# Patient Record
Sex: Female | Born: 1975 | Race: Black or African American | Hispanic: No | Marital: Married | Smoking: Former smoker
Health system: Southern US, Community
[De-identification: ages and names within clinical notes are randomized; demographics above are authoritative.]

## PROBLEM LIST (undated history)

## (undated) DIAGNOSIS — E0500 Thyrotoxicosis with diffuse goiter without thyrotoxic crisis or storm: Secondary | ICD-10-CM

## (undated) DIAGNOSIS — F419 Anxiety disorder, unspecified: Secondary | ICD-10-CM

## (undated) DIAGNOSIS — K219 Gastro-esophageal reflux disease without esophagitis: Secondary | ICD-10-CM

## (undated) DIAGNOSIS — R002 Palpitations: Secondary | ICD-10-CM

## (undated) DIAGNOSIS — E669 Obesity, unspecified: Secondary | ICD-10-CM

## (undated) DIAGNOSIS — G5600 Carpal tunnel syndrome, unspecified upper limb: Secondary | ICD-10-CM

## (undated) HISTORY — DX: Anxiety disorder, unspecified: F41.9

## (undated) HISTORY — DX: Carpal tunnel syndrome, unspecified upper limb: G56.00

## (undated) HISTORY — DX: Obesity, unspecified: E66.9

## (undated) HISTORY — PX: WISDOM TOOTH EXTRACTION: SHX21

## (undated) HISTORY — PX: IUD REMOVAL: SHX5392

## (undated) HISTORY — DX: Thyrotoxicosis with diffuse goiter without thyrotoxic crisis or storm: E05.00

## (undated) HISTORY — PX: OTHER SURGICAL HISTORY: SHX169

---

## 2003-05-08 ENCOUNTER — Emergency Department (HOSPITAL_COMMUNITY): Admission: EM | Admit: 2003-05-08 | Discharge: 2003-05-08 | Payer: Self-pay | Admitting: Emergency Medicine

## 2004-09-23 ENCOUNTER — Other Ambulatory Visit: Payer: Self-pay

## 2004-09-23 ENCOUNTER — Emergency Department: Payer: Self-pay | Admitting: Emergency Medicine

## 2005-01-13 ENCOUNTER — Emergency Department: Payer: Self-pay | Admitting: Emergency Medicine

## 2007-04-06 ENCOUNTER — Emergency Department (HOSPITAL_COMMUNITY): Admission: EM | Admit: 2007-04-06 | Discharge: 2007-04-06 | Payer: Self-pay | Admitting: Emergency Medicine

## 2008-06-15 ENCOUNTER — Emergency Department (HOSPITAL_COMMUNITY): Admission: EM | Admit: 2008-06-15 | Discharge: 2008-06-15 | Payer: Self-pay | Admitting: Emergency Medicine

## 2008-11-16 ENCOUNTER — Encounter: Payer: Self-pay | Admitting: Emergency Medicine

## 2008-11-16 ENCOUNTER — Inpatient Hospital Stay (HOSPITAL_COMMUNITY): Admission: AD | Admit: 2008-11-16 | Discharge: 2008-11-16 | Payer: Self-pay | Admitting: Obstetrics and Gynecology

## 2008-11-17 ENCOUNTER — Inpatient Hospital Stay (HOSPITAL_COMMUNITY): Admission: AD | Admit: 2008-11-17 | Discharge: 2008-11-17 | Payer: Self-pay | Admitting: Obstetrics and Gynecology

## 2009-01-28 HISTORY — PX: TUBAL LIGATION: SHX77

## 2009-05-02 ENCOUNTER — Emergency Department (HOSPITAL_COMMUNITY): Admission: EM | Admit: 2009-05-02 | Discharge: 2009-05-02 | Payer: Self-pay | Admitting: Family Medicine

## 2009-07-10 ENCOUNTER — Emergency Department (HOSPITAL_COMMUNITY): Admission: EM | Admit: 2009-07-10 | Discharge: 2009-07-10 | Payer: Self-pay | Admitting: Emergency Medicine

## 2009-07-13 ENCOUNTER — Emergency Department (HOSPITAL_COMMUNITY): Admission: EM | Admit: 2009-07-13 | Discharge: 2009-07-13 | Payer: Self-pay | Admitting: Family Medicine

## 2009-07-15 ENCOUNTER — Emergency Department (HOSPITAL_COMMUNITY): Admission: EM | Admit: 2009-07-15 | Discharge: 2009-07-15 | Payer: Self-pay | Admitting: Family Medicine

## 2009-08-08 ENCOUNTER — Emergency Department (HOSPITAL_COMMUNITY): Admission: EM | Admit: 2009-08-08 | Discharge: 2009-08-08 | Payer: Self-pay | Admitting: Family Medicine

## 2009-08-16 ENCOUNTER — Emergency Department (HOSPITAL_COMMUNITY): Admission: EM | Admit: 2009-08-16 | Discharge: 2009-08-16 | Payer: Self-pay | Admitting: Emergency Medicine

## 2009-09-20 ENCOUNTER — Emergency Department (HOSPITAL_COMMUNITY): Admission: EM | Admit: 2009-09-20 | Discharge: 2009-09-20 | Payer: Self-pay | Admitting: Family Medicine

## 2009-11-25 ENCOUNTER — Encounter: Payer: Self-pay | Admitting: Emergency Medicine

## 2009-11-26 ENCOUNTER — Encounter (INDEPENDENT_AMBULATORY_CARE_PROVIDER_SITE_OTHER): Payer: Self-pay | Admitting: Obstetrics and Gynecology

## 2009-11-26 ENCOUNTER — Observation Stay (HOSPITAL_COMMUNITY): Admission: AD | Admit: 2009-11-26 | Discharge: 2009-11-26 | Payer: Self-pay | Admitting: Obstetrics & Gynecology

## 2010-04-11 LAB — URINALYSIS, ROUTINE W REFLEX MICROSCOPIC
Glucose, UA: NEGATIVE mg/dL
Ketones, ur: 15 mg/dL — AB
Nitrite: NEGATIVE
Protein, ur: NEGATIVE mg/dL
pH: 6.5 (ref 5.0–8.0)

## 2010-04-11 LAB — DIFFERENTIAL
Lymphocytes Relative: 33 % (ref 12–46)
Lymphs Abs: 3.3 10*3/uL (ref 0.7–4.0)
Monocytes Relative: 10 % (ref 3–12)
Neutro Abs: 5.6 10*3/uL (ref 1.7–7.7)
Neutrophils Relative %: 57 % (ref 43–77)

## 2010-04-11 LAB — CBC
HCT: 29.5 % — ABNORMAL LOW (ref 36.0–46.0)
Hemoglobin: 10.8 g/dL — ABNORMAL LOW (ref 12.0–15.0)
MCHC: 34.2 g/dL (ref 30.0–36.0)
Platelets: 264 10*3/uL (ref 150–400)
RBC: 3.48 MIL/uL — ABNORMAL LOW (ref 3.87–5.11)
RDW: 14.2 % (ref 11.5–15.5)
WBC: 10.7 10*3/uL — ABNORMAL HIGH (ref 4.0–10.5)
WBC: 9.9 10*3/uL (ref 4.0–10.5)

## 2010-04-11 LAB — TYPE AND SCREEN: ABO/RH(D): AB POS

## 2010-04-11 LAB — BASIC METABOLIC PANEL
CO2: 22 mEq/L (ref 19–32)
Calcium: 8.4 mg/dL (ref 8.4–10.5)
Creatinine, Ser: 0.71 mg/dL (ref 0.4–1.2)
GFR calc Af Amer: >60 mL/min (ref >60)
Sodium: 136 mEq/L (ref 135–145)

## 2010-04-11 LAB — HCG, QUANTITATIVE, PREGNANCY: hCG, Beta Chain, Quant, S: 2915 m[IU]/mL — ABNORMAL HIGH (ref <5)

## 2010-04-11 LAB — POCT PREGNANCY, URINE: Preg Test, Ur: POSITIVE

## 2010-04-13 ENCOUNTER — Inpatient Hospital Stay (HOSPITAL_COMMUNITY)
Admission: AD | Admit: 2010-04-13 | Discharge: 2010-04-13 | Disposition: A | Discharge status: Home / Self Care | Payer: Self-pay | Source: Ambulatory Visit | Attending: Obstetrics & Gynecology | Admitting: Obstetrics & Gynecology

## 2010-04-13 DIAGNOSIS — L03317 Cellulitis of buttock: Secondary | ICD-10-CM

## 2010-04-13 DIAGNOSIS — L0231 Cutaneous abscess of buttock: Secondary | ICD-10-CM | POA: Insufficient documentation

## 2010-04-15 LAB — CULTURE, ROUTINE-ABSCESS

## 2010-04-16 LAB — CULTURE, ROUTINE-ABSCESS

## 2010-04-18 LAB — POCT URINALYSIS DIP (DEVICE)
Glucose, UA: NEGATIVE mg/dL
Nitrite: NEGATIVE
pH: 6 (ref 5.0–8.0)

## 2010-05-03 LAB — CBC
HCT: 38.2 % (ref 36.0–46.0)
HCT: 39.3 % (ref 36.0–46.0)
Hemoglobin: 12 g/dL (ref 12.0–15.0)
Hemoglobin: 12.2 g/dL (ref 12.0–15.0)
Hemoglobin: 12.9 g/dL (ref 12.0–15.0)
Hemoglobin: 13 g/dL (ref 12.0–15.0)
MCHC: 33.2 g/dL (ref 30.0–36.0)
MCV: 90.7 fL (ref 78.0–100.0)
MCV: 91.6 fL (ref 78.0–100.0)
Platelets: 253 10*3/uL (ref 150–400)
RBC: 3.98 MIL/uL (ref 3.87–5.11)
RBC: 4.02 MIL/uL (ref 3.87–5.11)
RBC: 4.2 MIL/uL (ref 3.87–5.11)
RDW: 13.9 % (ref 11.5–15.5)
WBC: 6.2 10*3/uL (ref 4.0–10.5)
WBC: 7.3 10*3/uL (ref 4.0–10.5)
WBC: 8.4 10*3/uL (ref 4.0–10.5)

## 2010-05-03 LAB — TYPE AND SCREEN

## 2010-05-03 LAB — ABO/RH: ABO/RH(D): AB POS

## 2010-05-03 LAB — DIFFERENTIAL
Eosinophils Relative: 0 % (ref 0–5)
Lymphocytes Relative: 28 % (ref 12–46)
Lymphs Abs: 2 10*3/uL (ref 0.7–4.0)
Monocytes Absolute: 0.5 10*3/uL (ref 0.1–1.0)
Monocytes Relative: 7 % (ref 3–12)

## 2010-05-03 LAB — URINALYSIS, ROUTINE W REFLEX MICROSCOPIC
Hgb urine dipstick: NEGATIVE
Protein, ur: NEGATIVE mg/dL
Urobilinogen, UA: 1 mg/dL (ref 0.0–1.0)

## 2010-05-03 LAB — URINE MICROSCOPIC-ADD ON

## 2010-05-03 LAB — POCT PREGNANCY, URINE: Preg Test, Ur: POSITIVE

## 2013-04-12 ENCOUNTER — Encounter: Payer: Self-pay | Admitting: Family Medicine

## 2014-03-01 ENCOUNTER — Ambulatory Visit
Admission: RE | Admit: 2014-03-01 | Discharge: 2014-03-01 | Disposition: A | Discharge status: Home / Self Care | Payer: Medicaid Other | Source: Ambulatory Visit | Attending: Cardiovascular Disease | Admitting: Cardiovascular Disease

## 2014-03-01 ENCOUNTER — Other Ambulatory Visit: Payer: Self-pay | Admitting: Cardiovascular Disease

## 2014-03-01 DIAGNOSIS — R200 Anesthesia of skin: Secondary | ICD-10-CM

## 2014-03-01 DIAGNOSIS — R202 Paresthesia of skin: Principal | ICD-10-CM

## 2014-05-06 ENCOUNTER — Encounter: Payer: Self-pay | Admitting: Neurology

## 2014-05-06 ENCOUNTER — Ambulatory Visit (INDEPENDENT_AMBULATORY_CARE_PROVIDER_SITE_OTHER): Payer: Medicaid Other | Admitting: Neurology

## 2014-05-06 ENCOUNTER — Ambulatory Visit (INDEPENDENT_AMBULATORY_CARE_PROVIDER_SITE_OTHER): Payer: Self-pay | Admitting: Neurology

## 2014-05-06 DIAGNOSIS — G5602 Carpal tunnel syndrome, left upper limb: Secondary | ICD-10-CM

## 2014-05-06 DIAGNOSIS — G5601 Carpal tunnel syndrome, right upper limb: Secondary | ICD-10-CM

## 2014-05-06 DIAGNOSIS — G5603 Carpal tunnel syndrome, bilateral upper limbs: Secondary | ICD-10-CM

## 2014-05-06 DIAGNOSIS — G5600 Carpal tunnel syndrome, unspecified upper limb: Secondary | ICD-10-CM | POA: Insufficient documentation

## 2014-05-06 HISTORY — DX: Carpal tunnel syndrome, unspecified upper limb: G56.00

## 2014-05-06 NOTE — Progress Notes (Signed)
Please refer to EMG and nerve conduction study procedure note. 

## 2014-05-06 NOTE — Procedures (Signed)
     HISTORY:  Samantha Willis is a 39 year old patient with a history of Graves' disease with a six-month history of bilateral hand discomfort and numbness. The patient reports some left shoulder discomfort as well. She is being evaluated for a possible neuropathy or a cervical radiculopathy.  NERVE CONDUCTION STUDIES:  Nerve conduction studies were performed on both upper extremities. The distal motor latencies for the median nerves were prolonged bilaterally, with normal motor amplitudes for these nerves bilaterally. The distal motor latencies and motor amplitudes for the ulnar nerves were normal bilaterally. The F wave latencies and nerve conduction velocities for the median and ulnar nerves were normal bilaterally. The sensory latencies for the median nerves were prolonged bilaterally, normal for the ulnar nerves bilaterally.  EMG STUDIES:  EMG study was performed on the left upper extremity:  The first dorsal interosseous muscle reveals 2 to 4 K units with full recruitment. No fibrillations or positive waves were noted. The abductor pollicis brevis muscle reveals 2 to 4 K units with full recruitment. No fibrillations or positive waves were noted. The extensor indicis proprius muscle reveals 1 to 3 K units with full recruitment. No fibrillations or positive waves were noted. The pronator teres muscle reveals 2 to 3 K units with full recruitment. No fibrillations or positive waves were noted. The biceps muscle reveals 1 to 2 K units with full recruitment. No fibrillations or positive waves were noted. The triceps muscle reveals 2 to 4 K units with full recruitment. No fibrillations or positive waves were noted. The anterior deltoid muscle reveals 2 to 3 K units with full recruitment. No fibrillations or positive waves were noted. The cervical paraspinal muscles were tested at 2 levels. No abnormalities of insertional activity were seen at either level tested. There was good  relaxation.  A limited EMG study was performed on the right upper extremity:  The first dorsal interosseous muscle reveals 2 to 4 K units with full recruitment. No fibrillations or positive waves were noted. The abductor pollicis brevis muscle reveals 2 to 4 K units with full recruitment. No fibrillations or positive waves were noted. The extensor indicis proprius muscle reveals 1 to 3 K units with full recruitment. No fibrillations or positive waves were noted.   IMPRESSION:  Nerve conduction studies done on both upper extremities shows evidence of bilateral mild carpal tunnel syndrome. EMG evaluation of the left upper extremity was unremarkable, without evidence of an overlying cervical radiculopathy. A limited EMG study of the right upper extremity was unremarkable.  Jill Alexanders MD 05/06/2014 10:51 AM  [facility] [address] [place], [place] [address]  Phone [phone] Fax [phone]

## 2014-05-10 ENCOUNTER — Encounter: Payer: Self-pay | Admitting: Neurology

## 2014-05-10 ENCOUNTER — Ambulatory Visit (INDEPENDENT_AMBULATORY_CARE_PROVIDER_SITE_OTHER): Payer: Medicaid Other | Admitting: Neurology

## 2014-05-10 VITALS — BP 111/69 | HR 78 | Ht 63.0 in | Wt 177.6 lb

## 2014-05-10 DIAGNOSIS — G5601 Carpal tunnel syndrome, right upper limb: Secondary | ICD-10-CM

## 2014-05-10 DIAGNOSIS — G5602 Carpal tunnel syndrome, left upper limb: Secondary | ICD-10-CM

## 2014-05-10 DIAGNOSIS — G5603 Carpal tunnel syndrome, bilateral upper limbs: Secondary | ICD-10-CM

## 2014-05-10 NOTE — Progress Notes (Signed)
Reason for visit: Carpal tunnel syndrome  Referring physician: Dr. Kadakia  Samantha Willis is a 39 y.o. female  History of present illness:  Samantha Willis is a 39 year old black female with a history of numbness involving the hands that dates back about 6 months. The patient has a history of Graves' disease, and she is currently being treated for this. The patient indicates that the numbness may wake her up at night, she does have some slight neck discomfort, but no pain going down the arms from the neck. The patient has some intermittent numbness of the feet at times, but she relates this to swelling in the feet. She denies back pain, difficulty with weakness of the extremities, or difficulty with balance. She denies any issues controlling the bowels or the bladder. She has not had any falls. She has undergone EMG and nerve conduction study evaluation that shows evidence of mild bilateral carpal tunnel syndrome. She returns for an evaluation.  Past Medical History  Diagnosis Date  . Carpal tunnel syndrome 05/06/2014     Bilateral  . Obesity   . Graves disease     Past Surgical History  Procedure Laterality Date  . Cesarean section    . Tubal ligation  2011  . Wisdom tooth extraction      Family History  Problem Relation Age of Onset  . Heart attack Mother   . Cancer Mother 14    breast  . Diabetes Mother     II  . Hypertension Father   . Hyperlipidemia Father   . Gout Father   . Healthy Sister   . Healthy Brother   . Healthy Brother     Social history:  reports that she has been smoking.  She has never used smokeless tobacco. She reports that she drinks alcohol. She reports that she does not use illicit drugs.  Medications:  Prior to Admission medications   Medication Sig Start Date End Date Taking? Authorizing Provider  ALPRAZolam Duanne Moron) 0.25 MG tablet Take 0.25 mg by mouth at bedtime as needed for anxiety.   Yes Historical Provider, MD  Cholecalciferol (VITAMIN  D3) 2000 UNITS TABS Take 1 tablet by mouth daily.   Yes Historical Provider, MD  ibuprofen (ADVIL,MOTRIN) 200 MG tablet Take 200 mg by mouth every 8 (eight) hours as needed.   Yes Historical Provider, MD  methimazole (TAPAZOLE) 5 MG tablet Take 5 mg by mouth daily.   Yes Historical Provider, MD  metoprolol succinate (TOPROL-XL) 25 MG 24 hr tablet Take 25 mg by mouth daily.   Yes Historical Provider, MD  omeprazole (PRILOSEC) 20 MG capsule Take 20 mg by mouth daily.   Yes Historical Provider, MD     No Known Allergies  ROS:  Out of a complete 14 system review of symptoms, the patient complains only of the following symptoms, and all other reviewed systems are negative.  Palpitations of the heart, swelling in the legs Feeling hot, cold Allergies, runny nose Diarrhea Numbness Depression, anxiety Insomnia, sleepiness  Blood pressure 111/69, pulse 78, height 5\' 3"  (1.6 m), weight 177 lb 9.6 oz (80.559 kg).  Physical Exam  General: The patient is alert and cooperative at the time of the examination.  Eyes: Pupils are equal, round, and reactive to light. Discs are flat bilaterally.  Neck: The neck is supple, no carotid bruits are noted.  Respiratory: The respiratory examination is clear.  Cardiovascular: The cardiovascular examination reveals a regular rate and rhythm, no obvious murmurs or  rubs are noted.  Skin: Extremities are without significant edema.  Neurologic Exam  Mental status: The patient is alert and oriented x 3 at the time of the examination. The patient has apparent normal recent and remote memory, with an apparently normal attention span and concentration ability.  Cranial nerves: Facial symmetry is present. There is good sensation of the face to pinprick and soft touch bilaterally. The strength of the facial muscles and the muscles to head turning and shoulder shrug are normal bilaterally. Speech is well enunciated, no aphasia or dysarthria is noted. Extraocular  movements are full. Visual fields are full. The tongue is midline, and the patient has symmetric elevation of the soft palate. No obvious hearing deficits are noted.  Motor: The motor testing reveals 5 over 5 strength of all 4 extremities. Good symmetric motor tone is noted throughout.  Sensory: Sensory testing is intact to pinprick, soft touch, vibration sensation, and position sense on all 4 extremities. No evidence of extinction is noted.  Coordination: Cerebellar testing reveals good finger-nose-finger and heel-to-shin bilaterally.  Gait and station: Gait is normal. Tandem gait is normal. Romberg is negative. No drift is seen.  Reflexes: Deep tendon reflexes are symmetric and normal bilaterally. Toes are downgoing bilaterally.   Assessment/Plan:  1. Bilateral carpal tunnel syndrome  The patient has undergone nerve conduction studies that show evidence of mild bilateral carpal, syndrome. She continued to be symptomatic. The patient was given a prescription for bilateral wrist splints, she will wear the splints for least 6-8 weeks, but if she has not gained benefit after this period time, she is to contact our office, we will get her set up for a referral to a hand surgeon. Otherwise, she will follow-up through this office if needed.  Jill Alexanders MD 05/10/2014 7:53 PM  [facility] [address] [place], [place] [address]  Phone [phone] Fax [phone]

## 2014-05-10 NOTE — Patient Instructions (Signed)
Carpal Tunnel Syndrome °Carpal tunnel syndrome is a disorder of the nervous system in the wrist that causes pain, hand weakness, and/or loss of feeling. Carpal tunnel syndrome is caused by the compression, stretching, or irritation of the median nerve at the wrist joint. Athletes who experience carpal tunnel syndrome may notice a decrease in their performance to the condition, especially for sports that require strong hand or wrist action.  °SYMPTOMS  °· Tingling, numbness, or burning pain in the hand or fingers. °· Inability to sleep due to pain in the hand. °· Sharp pains that shoot from the wrist up the arm or to the fingers, especially at night. °· Morning stiffness or cramping of the hand. °· Thumb weakness, resulting in difficulty holding objects or making a fist. °· Shiny, dry skin on the hand. °· Reduced performance in any sport requiring a strong grip. °CAUSES  °· Median nerve damage at the wrist is caused by pressure due to swelling, inflammation, or scarred tissue. °· Sources of pressure include: °¨ Repetitive gripping or squeezing that causes inflammation of the tendon sheaths. °¨ Scarring or shortening of the ligament that covers the median nerve. °¨ Traumatic injury to the wrist or forearm such as fracture, sprain, or dislocation. °¨ Prolonged hyperextension (wrist bent backward) or hyperflexion (wrist bent downward) of the wrist. °RISK INCREASES WITH: °· Diabetes mellitus. °· Menopause or amenorrhea. °· Rheumatoid arthritis. °· Raynaud disease. °· Pregnancy. °· Gout. °· Kidney disease. °· Ganglion cyst. °· Repetitive hand or wrist action. °· Hypothyroidism (underactive thyroid gland). °· Repetitive jolting or shaking of the hands or wrist. °· Prolonged forceful weight-bearing on the hands. °PREVENTION °· Bracing the hand and wrist straight during activities that involve repetitive grasping. °· For activities that require prolonged extension of the wrist (bending towards the top of the forearm)  periodically change the position of your wrists. °· Learn and use proper technique in activities that result in the wrist position in neutral to slight extension. °· Avoid bending the wrist into full extension or flexion (up or down). °· Keep the wrist in a straight (neutral) position. To keep the wrist in this position, wear a splint. °· Avoid repetitive hand and wrist motions. °· When possible avoid prolonged grasping of items (steering wheel of a car, a pen, a vacuum cleaner, or a rake). °· Loosen your grip for activities that require prolonged grasping of items. °· Place keyboards and writing surfaces at the correct height as to decrease strain on the wrist and hand. °· Alternate work tasks to avoid prolonged wrist flexion. °· Avoid pinching activities (needlework and writing) as they may irritate your carpal tunnel syndrome. °· If these activities are necessary, complete them for shorter periods of time. °· When writing, use a felt tip or rollerball pen and/or build up the grip on a pen to decrease the forces required for writing. °PROGNOSIS  °Carpal tunnel syndrome is usually curable with appropriate conservative treatment and sometimes resolves spontaneously. For some cases, surgery is necessary, especially if muscle wasting or nerve changes have developed.  °RELATED COMPLICATIONS  °· Permanent numbness and a weak thumb or fingers in the affected hand. °· Permanent paralysis of a portion of the hand and fingers. °TREATMENT  °Treatment initially consists of stopping activities that aggravate the symptoms as well as medication and ice to reduce inflammation. A wrist splint is often recommended for wear during activities of repetitive motion as well as at night. It is also important to learn and use proper technique when   performing activities that typically cause pain. On occasion, a corticosteroid injection may be given. °If symptoms persist despite conservative treatment, surgery may be an option. Surgical  techniques free the pinched or compressed nerve. Carpal tunnel surgery is usually performed on an outpatient basis, meaning you go home the same day as surgery. These procedures provide almost complete relief of all symptoms in 95% of patients. Expect at least 2 weeks for healing after surgery. For cases that are the result of repeated jolting or shaking of the hand or wrist or prolonged hyperextension, surgery is not usually recommended because stretching of the median nerve, not compression, is usually the cause of carpal tunnel syndrome in these cases. °MEDICATION  °· If pain medication is necessary, nonsteroidal anti-inflammatory medications, such as aspirin and ibuprofen, or other minor pain relievers, such as acetaminophen, are often recommended. °· Do not take pain medication for 7 days before surgery. °· Prescription pain relievers are usually only prescribed after surgery. Use only as directed and only as much as you need. °· Corticosteroid injections may be given to reduce inflammation. However, they are not always recommended. °· Vitamin B6 (pyridoxine) may reduce symptoms; use only if prescribed for your disorder. °SEEK MEDICAL CARE IF:  °· Symptoms get worse or do not improve in 2 weeks despite treatment. °· You also have a current or recent history of neck or shoulder injury that has resulted in pain or tingling elsewhere in your arm. °Document Released: 01/14/2005 Document Revised: 05/31/2013 Document Reviewed: 04/28/2008 °ExitCare® Patient Information ©2015 [employer]. This information is not intended to replace advice given to you by your health care provider. Make sure you discuss any questions you have with your health care provider. ° °

## 2015-07-26 ENCOUNTER — Ambulatory Visit (INDEPENDENT_AMBULATORY_CARE_PROVIDER_SITE_OTHER): Payer: Medicaid Other | Admitting: Neurology

## 2015-07-26 ENCOUNTER — Encounter: Payer: Self-pay | Admitting: Neurology

## 2015-07-26 VITALS — BP 124/72 | HR 80 | Ht 63.0 in | Wt 175.0 lb

## 2015-07-26 DIAGNOSIS — G5603 Carpal tunnel syndrome, bilateral upper limbs: Secondary | ICD-10-CM

## 2015-07-26 DIAGNOSIS — M542 Cervicalgia: Secondary | ICD-10-CM

## 2015-07-26 NOTE — Progress Notes (Signed)
Reason for visit: Neck and arm pain  Referring physician: Dr. Kakakia  Samantha Willis is a 40 y.o. female  History of present illness:  Samantha Willis is a 40 year old right-handed black female with a history of bilateral hand numbness, and a sensation of swelling. The patient has had EMG and nerve conduction study evaluation done through this office in April 2016. This documented bilateral carpal tunnel syndrome, EMG on the left arm did not show evidence of a cervical radiculopathy. The patient has had ongoing symptoms in this regard that have worsened recently. The patient has numbness in both hands, and the left hand is worse than the other. The patient has sensation swelling in both hands. She has developed over the last several months some neck discomfort, particularly on the left with pain going down into the shoulder and upper arm on the left. The patient does not believe that she is weak but she has discomfort when trying to elevate the left arm up over her head. The patient denies any balance changes or difficulty controlling the bowels or the bladder. She will occasionally drop things from the hands. She denies any pain or numbness in the legs. She is sent to this office for reevaluation.  Past Medical History  Diagnosis Date  . Carpal tunnel syndrome 05/06/2014     Bilateral  . Obesity   . Graves disease   . Anxiety     Past Surgical History  Procedure Laterality Date  . Cesarean section    . Tubal ligation  2011  . Wisdom tooth extraction      Family History  Problem Relation Age of Onset  . Heart attack Mother   . Cancer Mother 27    breast  . Diabetes Mother     II  . Hypertension Father   . Hyperlipidemia Father   . Gout Father   . Healthy Sister   . Healthy Brother   . Healthy Brother     Social history:  reports that she has been smoking.  She has never used smokeless tobacco. She reports that she drinks alcohol. She reports that she does not use  illicit drugs.  Medications:  Prior to Admission medications   Medication Sig Start Date End Date Taking? Authorizing Provider  ALPRAZolam Duanne Moron) 0.25 MG tablet Take 0.25 mg by mouth at bedtime as needed for anxiety.   Yes Historical Provider, MD  Cholecalciferol (VITAMIN D3) 2000 UNITS TABS Take 1 tablet by mouth daily.   Yes Historical Provider, MD  ibuprofen (ADVIL,MOTRIN) 200 MG tablet Take 200 mg by mouth every 8 (eight) hours as needed.   Yes Historical Provider, MD  metoprolol succinate (TOPROL-XL) 25 MG 24 hr tablet Take 25 mg by mouth daily.   Yes Historical Provider, MD  omeprazole (PRILOSEC) 20 MG capsule Take 20 mg by mouth daily.   Yes Historical Provider, MD  tiZANidine (ZANAFLEX) 2 MG tablet Take 2 mg by mouth daily. 07/17/15  Yes Historical Provider, MD  HYDROcodone-acetaminophen (NORCO/VICODIN) 5-325 MG tablet Take 1 tablet by mouth at bedtime as needed. Reported on 07/26/2015 07/03/15   Historical Provider, MD     No Known Allergies  ROS:  Out of a complete 14 system review of symptoms, the patient complains only of the following symptoms, and all other reviewed systems are negative.  Joint pain, joint swelling, aching muscles Numbness Anxiety  Blood pressure 124/72, pulse 80, height 5\' 3"  (1.6 m), weight 175 lb (79.379 kg).  Physical Exam  General: The patient is alert and cooperative at the time of the examination.  Eyes: Pupils are equal, round, and reactive to light. Discs are flat bilaterally.  Neck: The neck is supple, no carotid bruits are noted.  Respiratory: The respiratory examination is clear.  Cardiovascular: The cardiovascular examination reveals a regular rate and rhythm, no obvious murmurs or rubs are noted.  Neuromuscular: Range of movement of the cervical spine is full.  Skin: Extremities are without significant edema.  Neurologic Exam  Mental status: The patient is alert and oriented x 3 at the time of the examination. The patient has  apparent normal recent and remote memory, with an apparently normal attention span and concentration ability.  Cranial nerves: Facial symmetry is present. There is good sensation of the face to pinprick and soft touch bilaterally. The strength of the facial muscles and the muscles to head turning and shoulder shrug are normal bilaterally. Speech is well enunciated, no aphasia or dysarthria is noted. Extraocular movements are full. Visual fields are full. The tongue is midline, and the patient has symmetric elevation of the soft palate. No obvious hearing deficits are noted.  Motor: The motor testing reveals 5 over 5 strength of all 4 extremities. Good symmetric motor tone is noted throughout.  Sensory: Sensory testing is intact to pinprick, soft touch, vibration sensation, and position sense on all 4 extremities. No evidence of extinction is noted.  Coordination: Cerebellar testing reveals good finger-nose-finger and heel-to-shin bilaterally.  Gait and station: Gait is normal. Tandem gait is normal. Romberg is negative. No drift is seen.  Reflexes: Deep tendon reflexes are symmetric and normal bilaterally. Toes are downgoing bilaterally.   Assessment/Plan:  1. Left neck pain, shoulder pain  2. Bilateral carpal tunnel syndrome  The patient seems to have had ongoing persistent symptoms that have actually worsened over time associated with the bilateral carpal tunnel syndrome. I will make a referral to Dr. Fredna Dow for this issue. The patient is now having some left-sided neck and shoulder pain and upper arm pain on the left. MRI of the cervical spine will be done. I will call the patient concerning the results when they are available to me.  Jill Alexanders MD 07/26/2015 6:29 PM  [facility] [address] [place], [place] [address]  Phone [phone] Fax [phone]

## 2015-08-04 ENCOUNTER — Ambulatory Visit
Admission: RE | Admit: 2015-08-04 | Discharge: 2015-08-04 | Disposition: A | Discharge status: Home / Self Care | Payer: Medicaid Other | Source: Ambulatory Visit | Attending: Neurology | Admitting: Neurology

## 2015-08-04 DIAGNOSIS — M542 Cervicalgia: Secondary | ICD-10-CM

## 2015-08-04 DIAGNOSIS — G5603 Carpal tunnel syndrome, bilateral upper limbs: Secondary | ICD-10-CM

## 2015-08-06 ENCOUNTER — Telehealth: Payer: Self-pay | Admitting: Neurology

## 2015-08-06 DIAGNOSIS — S161XXS Strain of muscle, fascia and tendon at neck level, sequela: Secondary | ICD-10-CM

## 2015-08-06 NOTE — Telephone Encounter (Signed)
I called the patient. The MRI of the cervical spine is OK. The patient is on tizanidine for spasm, I will set her up for PT.    MRI cervical 08/06/15:  IMPRESSION: This MRI of the cervical spine shows the following: 1. There are no significant degenerative changes noted. There is no foraminal narrowing or nerve root impingement. 2. The spinal cord appears normal. 3. There is straightening of the curvature of the cervical spine that could be due to muscle spasms or to positioning within the magnet.

## 2015-08-17 ENCOUNTER — Ambulatory Visit: Payer: Medicaid Other | Attending: Neurology | Admitting: Rehabilitative and Restorative Service Providers"

## 2015-08-17 DIAGNOSIS — M6281 Muscle weakness (generalized): Secondary | ICD-10-CM | POA: Insufficient documentation

## 2015-08-17 DIAGNOSIS — R293 Abnormal posture: Secondary | ICD-10-CM | POA: Insufficient documentation

## 2015-08-17 DIAGNOSIS — M542 Cervicalgia: Secondary | ICD-10-CM | POA: Diagnosis present

## 2015-08-17 NOTE — Patient Instructions (Addendum)
Thoracic Self-Mobilization (Supine)    With rolled towel placed lengthwise at lower ribs level, lie back on towel with arms outstretched. Hold __2 minutes. Relax. Repeat _1_ times per set. Do _2___ sessions per day.  http://orth.exer.us/1001   Copyright  VHI. All rights reserved.   Scapular Retraction (Prone)    Lie with arms at sides. Pinch shoulder blades together and raise arms a few inches from floor. Repeat __10__ times per set. Do __2__ sets per session. Do __2__ sessions per day.  http://orth.exer.us/955   Copyright  VHI. All rights reserved.   Upper Limb Neural Tension I, Standing    Stand, one hand over top of head, other hand against low back. Turn head down toward pulling side. Gently increase stretch by pulling on head and depressing opposite (stretch-side) shoulder blade. Hold _15-20__ seconds. Repeat _3__ times per session. Do _2__ sessions per day.  Copyright  VHI. All rights reserved.   Healthy Back - Shoulder Roll    Stand straight with arms relaxed at sides. Roll shoulders backward continuously. Do __10__ times.  Can be done in sitting or standing. This exercise can also be done one shoulder at a time.  Copyright  VHI. All rights reserved.   Horizontal Abduction (Resistive Band)    With arms at shoulder level, keep elbows straight. Using other arm as anchor, pull involved arm outward. Hold __3__ seconds. Repeat _10___ times. Do __2__ sessions per day.  Copyright  VHI. All rights reserved.   Prone Plank (Eccentric)    On toes and elbows, pull abdomen in while stabilizing trunk. Slowly lower downward without arching back. __5_ reps per set, _2__ sets per day.  http://ecce.exer.us/243   Copyright  VHI. All rights reserved.   Hours of Operation [facility] operates each Tuesday evening from 6pm-8pm unless otherwise stated. [facility] Phone: [phone] Clinic Email: [email or web address] Faculty  Advisor Dr. Elbert Ewings, [email or web address]

## 2015-08-18 NOTE — Therapy (Signed)
[name] [address] [place], [place], [address] Phone: [phone]   Fax:  [phone]  Physical Therapy Evaluation  Patient Details  Name: Samantha Willis MRN: [id number] Date of Birth: November 02, 1975 Referring Provider: Margette Fast, MD  Encounter Date: 08/17/2015      PT End of Session - 08/17/15 1354    Visit Number 1   Number of Visits 1   Authorization Type m-caid- authorizes one visit only for this diagnosis   PT Start Time 1320   PT Stop Time 1400   PT Time Calculation (min) 40 min   Activity Tolerance Patient tolerated treatment well   Behavior During Therapy [facility] for tasks assessed/performed      Past Medical History  Diagnosis Date  . Carpal tunnel syndrome 05/06/2014     Bilateral  . Obesity   . Graves disease   . Anxiety     Past Surgical History  Procedure Laterality Date  . Cesarean section    . Tubal ligation  2011  . Wisdom tooth extraction      There were no vitals filed for this visit.       Subjective Assessment - 08/17/15 1322    Subjective The patient reports that she had onset of neck pain radiating into shoulders after using carpet cleaning machine at home.  Symptoms seem to be most aggravated by household tasks.  She reports she has h/o difficulty with sleeping that is aggravated by neck pain.   Onset date is 10/2014 with symptoms coming and going.     Patient Stated Goals Reduce pain.     Currently in Pain? Yes   Pain Score 3   can go up to 9-10/10 pain   Pain Location Neck   Pain Orientation Left   Pain Descriptors / Indicators Aching;Throbbing   Pain Radiating Towards left shoulder   Pain Onset More than a month ago   Pain Frequency Constant   Aggravating Factors  pain varies in intensity; worse with household tasks   Pain Relieving Factors medications            [facility] PT Assessment - 08/17/15 1327    Assessment   Medical Diagnosis cervical strain    Referring Provider Margette Fast, MD   Onset Date/Surgical Date --  10/2014   Hand Dominance Right   Prior Therapy none for neck; using splint for R wrist   Precautions   Precautions None   Restrictions   Weight Bearing Restrictions No   Balance Screen   Has the patient fallen in the past 6 months No   Has the patient had a decrease in activity level because of a fear of falling?  No   Is the patient reluctant to leave their home because of a fear of falling?  No   Home Ecologist residence   Living Arrangements --  lives iwth family   Prior Function   Level of Independence Independent   Vocation Student   ROM / Strength   AROM / PROM / Strength AROM;Strength   AROM   Overall AROM  Within functional limits for tasks performed   Overall AROM Comments Neck ROM is full for rotation, sidebending and flexion/extension   Strength   Overall Strength Comments Wrist not tested due to bilateral carpal tunnel   Strength Assessment Site Shoulder;Elbow   Right/Left Shoulder Right;Left   Right Shoulder Flexion 5/5   Right Shoulder ABduction 5/5   Left Shoulder  Flexion 5/5   Left Shoulder ABduction 4-/5   Right/Left Elbow --  5/5 bilateral elbow flexin/extension.    Palpation   Palpation comment tightness noted in L upper trapezius                   [facility] Adult PT Treatment/Exercise - 08/18/15 1200    Exercises   Exercises Other Exercises   Other Exercises  See patient education section- focused on providing comprehensive HEP for self mgmt of symptoms.                PT Education - 08/17/15 1352    Education provided Yes   Education Details HEP:  scapular retraction with red band, towel roll stretch, plank position, neck tension stretch, shoulder rolls, plank prone on elbows   Person(s) Educated Patient   Methods Explanation;Demonstration;Handout   Comprehension Verbalized understanding;Returned demonstration                     Plan - 08/18/15 1155    Clinical Impression Statement The patient is a 40 year old female presenting to OP therapy for cervical strain.  Her ROM is WFLs.  She has postural changes and mild weakness addressed today through HEP as m-caid does not authorize treatment visits for this diagnosis.  PT educated patient on options for care and provided referral information for [facility] (free clinic tuesdays from 6-8).  Recommended the patient try current HEP recommended today x 3 weeks and if not improving, then seek further care.  Patient agrees to planof care.    PT Next Visit Plan No f/u scheduled due to financial limitations/insurance limitations.   Consulted and Agree with Plan of Care Patient      Patient will benefit from skilled therapeutic intervention in order to improve the following deficits and impairments:     Visit Diagnosis: Cervicalgia  Abnormal posture  Muscle weakness (generalized)     Problem List Patient Active Problem List   Diagnosis Date Noted  . Carpal tunnel syndrome 05/06/2014    Idelle Reimann, PT 08/18/2015, 12:04 PM  [facility] [address] [place], [place], [address] Phone: [phone]   Fax:  [phone]  Name: Samantha Willis MRN: [id number] Date of Birth: 1975-06-12

## 2015-09-22 ENCOUNTER — Encounter: Payer: Self-pay | Admitting: *Deleted

## 2015-10-26 ENCOUNTER — Other Ambulatory Visit: Payer: Self-pay | Admitting: Orthopedic Surgery

## 2015-11-15 ENCOUNTER — Encounter (HOSPITAL_BASED_OUTPATIENT_CLINIC_OR_DEPARTMENT_OTHER): Payer: Self-pay | Admitting: *Deleted

## 2015-11-21 ENCOUNTER — Ambulatory Visit (HOSPITAL_BASED_OUTPATIENT_CLINIC_OR_DEPARTMENT_OTHER): Payer: Medicaid Other | Admitting: Certified Registered"

## 2015-11-21 ENCOUNTER — Ambulatory Visit (HOSPITAL_BASED_OUTPATIENT_CLINIC_OR_DEPARTMENT_OTHER)
Admission: RE | Admit: 2015-11-21 | Discharge: 2015-11-21 | Disposition: A | Discharge status: Home / Self Care | Payer: Medicaid Other | Source: Ambulatory Visit | Attending: Orthopedic Surgery | Admitting: Orthopedic Surgery

## 2015-11-21 ENCOUNTER — Encounter (HOSPITAL_BASED_OUTPATIENT_CLINIC_OR_DEPARTMENT_OTHER)
Admission: RE | Disposition: A | Discharge status: Home / Self Care | Payer: Self-pay | Source: Ambulatory Visit | Attending: Orthopedic Surgery

## 2015-11-21 ENCOUNTER — Encounter (HOSPITAL_BASED_OUTPATIENT_CLINIC_OR_DEPARTMENT_OTHER): Payer: Self-pay | Admitting: Certified Registered"

## 2015-11-21 DIAGNOSIS — G5603 Carpal tunnel syndrome, bilateral upper limbs: Secondary | ICD-10-CM | POA: Insufficient documentation

## 2015-11-21 DIAGNOSIS — Z6831 Body mass index (BMI) 31.0-31.9, adult: Secondary | ICD-10-CM | POA: Diagnosis not present

## 2015-11-21 DIAGNOSIS — F17210 Nicotine dependence, cigarettes, uncomplicated: Secondary | ICD-10-CM | POA: Diagnosis not present

## 2015-11-21 DIAGNOSIS — F419 Anxiety disorder, unspecified: Secondary | ICD-10-CM | POA: Diagnosis not present

## 2015-11-21 DIAGNOSIS — E669 Obesity, unspecified: Secondary | ICD-10-CM | POA: Diagnosis not present

## 2015-11-21 DIAGNOSIS — Z79899 Other long term (current) drug therapy: Secondary | ICD-10-CM | POA: Diagnosis not present

## 2015-11-21 HISTORY — PX: CARPAL TUNNEL RELEASE: SHX101

## 2015-11-21 SURGERY — CARPAL TUNNEL RELEASE
Anesthesia: Monitor Anesthesia Care | Site: Wrist | Laterality: Right

## 2015-11-21 MED ORDER — LACTATED RINGERS IV SOLN: INTRAVENOUS | Status: DC

## 2015-11-21 MED ORDER — SCOPOLAMINE 1 MG/3DAYS TD PT72: 1.0000 | MEDICATED_PATCH | Freq: Once | TRANSDERMAL | Status: DC | PRN

## 2015-11-21 MED ORDER — CEFAZOLIN SODIUM-DEXTROSE 2-4 GM/100ML-% IV SOLN
INTRAVENOUS | Status: AC
  Filled 2015-11-21: qty 100

## 2015-11-21 MED ORDER — LACTATED RINGERS IV SOLN
INTRAVENOUS | Status: DC
  Administered 2015-11-21: 09:00:00 via INTRAVENOUS

## 2015-11-21 MED ORDER — PROPOFOL 500 MG/50ML IV EMUL
INTRAVENOUS | Status: DC | PRN
  Administered 2015-11-21: 75 ug/kg/min via INTRAVENOUS

## 2015-11-21 MED ORDER — FENTANYL CITRATE (PF) 100 MCG/2ML IJ SOLN
INTRAMUSCULAR | Status: AC
  Filled 2015-11-21: qty 2

## 2015-11-21 MED ORDER — LIDOCAINE HCL (PF) 0.5 % IJ SOLN
INTRAMUSCULAR | Status: DC | PRN
  Administered 2015-11-21: 30 mL via INTRAVENOUS

## 2015-11-21 MED ORDER — HYDROCODONE-ACETAMINOPHEN 5-325 MG PO TABS: 1.0000 | ORAL_TABLET | Freq: Four times a day (QID) | ORAL | 0 refills | Status: DC | PRN

## 2015-11-21 MED ORDER — BUPIVACAINE HCL (PF) 0.25 % IJ SOLN
INTRAMUSCULAR | Status: DC | PRN
  Administered 2015-11-21: 9 mL

## 2015-11-21 MED ORDER — LIDOCAINE 2% (20 MG/ML) 5 ML SYRINGE
INTRAMUSCULAR | Status: AC
  Filled 2015-11-21: qty 5

## 2015-11-21 MED ORDER — MEPERIDINE HCL 25 MG/ML IJ SOLN: 6.2500 mg | INTRAMUSCULAR | Status: DC | PRN

## 2015-11-21 MED ORDER — CEFAZOLIN SODIUM-DEXTROSE 2-4 GM/100ML-% IV SOLN
2.0000 g | INTRAVENOUS | Status: AC
  Administered 2015-11-21: 2 g via INTRAVENOUS

## 2015-11-21 MED ORDER — ONDANSETRON HCL 4 MG/2ML IJ SOLN
INTRAMUSCULAR | Status: AC
  Filled 2015-11-21: qty 2

## 2015-11-21 MED ORDER — FENTANYL CITRATE (PF) 100 MCG/2ML IJ SOLN: 25.0000 ug | INTRAMUSCULAR | Status: DC | PRN

## 2015-11-21 MED ORDER — CHLORHEXIDINE GLUCONATE 4 % EX LIQD: 60.0000 mL | Freq: Once | CUTANEOUS | Status: DC

## 2015-11-21 MED ORDER — METOCLOPRAMIDE HCL 5 MG/ML IJ SOLN: 10.0000 mg | Freq: Once | INTRAMUSCULAR | Status: DC | PRN

## 2015-11-21 MED ORDER — MIDAZOLAM HCL 2 MG/2ML IJ SOLN
1.0000 mg | INTRAMUSCULAR | Status: DC | PRN
  Administered 2015-11-21: 2 mg via INTRAVENOUS

## 2015-11-21 MED ORDER — GLYCOPYRROLATE 0.2 MG/ML IJ SOLN: 0.2000 mg | Freq: Once | INTRAMUSCULAR | Status: DC | PRN

## 2015-11-21 MED ORDER — MIDAZOLAM HCL 2 MG/2ML IJ SOLN
INTRAMUSCULAR | Status: AC
  Filled 2015-11-21: qty 2

## 2015-11-21 MED ORDER — ONDANSETRON HCL 4 MG/2ML IJ SOLN
INTRAMUSCULAR | Status: DC | PRN
  Administered 2015-11-21: 4 mg via INTRAVENOUS

## 2015-11-21 MED ORDER — FENTANYL CITRATE (PF) 100 MCG/2ML IJ SOLN
50.0000 ug | INTRAMUSCULAR | Status: DC | PRN
  Administered 2015-11-21 (×2): 50 ug via INTRAVENOUS

## 2015-11-21 SURGICAL SUPPLY — 34 items
BLADE SURG 15 STRL LF DISP TIS (BLADE) ×1 IMPLANT
BLADE SURG 15 STRL SS (BLADE) ×1
BNDG COHESIVE 3X5 TAN STRL LF (GAUZE/BANDAGES/DRESSINGS) ×2 IMPLANT
BNDG ESMARK 4X9 LF (GAUZE/BANDAGES/DRESSINGS) ×2 IMPLANT
BNDG GAUZE ELAST 4 BULKY (GAUZE/BANDAGES/DRESSINGS) ×2 IMPLANT
CHLORAPREP W/TINT 26ML (MISCELLANEOUS) ×2 IMPLANT
CORDS BIPOLAR (ELECTRODE) ×2 IMPLANT
COVER BACK TABLE 60X90IN (DRAPES) ×2 IMPLANT
COVER MAYO STAND STRL (DRAPES) ×2 IMPLANT
CUFF TOURNIQUET SINGLE 18IN (TOURNIQUET CUFF) ×2 IMPLANT
DRAPE EXTREMITY T 121X128X90 (DRAPE) ×2 IMPLANT
DRAPE SURG 17X23 STRL (DRAPES) ×2 IMPLANT
DRSG PAD ABDOMINAL 8X10 ST (GAUZE/BANDAGES/DRESSINGS) ×2 IMPLANT
GAUZE SPONGE 4X4 12PLY STRL (GAUZE/BANDAGES/DRESSINGS) ×2 IMPLANT
GAUZE XEROFORM 1X8 LF (GAUZE/BANDAGES/DRESSINGS) ×2 IMPLANT
GLOVE BIOGEL PI IND STRL 7.0 (GLOVE) ×2 IMPLANT
GLOVE BIOGEL PI IND STRL 8.5 (GLOVE) ×1 IMPLANT
GLOVE BIOGEL PI INDICATOR 7.0 (GLOVE) ×2
GLOVE BIOGEL PI INDICATOR 8.5 (GLOVE) ×1
GLOVE ECLIPSE 6.5 STRL STRAW (GLOVE) ×2 IMPLANT
GLOVE SURG ORTHO 8.0 STRL STRW (GLOVE) ×2 IMPLANT
GOWN STRL REUS W/ TWL LRG LVL3 (GOWN DISPOSABLE) IMPLANT
GOWN STRL REUS W/TWL LRG LVL3 (GOWN DISPOSABLE)
GOWN STRL REUS W/TWL XL LVL3 (GOWN DISPOSABLE) ×4 IMPLANT
NEEDLE PRECISIONGLIDE 27X1.5 (NEEDLE) ×2 IMPLANT
NS IRRIG 1000ML POUR BTL (IV SOLUTION) ×2 IMPLANT
PACK BASIN DAY SURGERY FS (CUSTOM PROCEDURE TRAY) ×2 IMPLANT
STOCKINETTE 4X48 STRL (DRAPES) ×2 IMPLANT
SUT ETHILON 4 0 PS 2 18 (SUTURE) ×2 IMPLANT
SUT VICRYL 4-0 PS2 18IN ABS (SUTURE) IMPLANT
SYR BULB 3OZ (MISCELLANEOUS) ×2 IMPLANT
SYR CONTROL 10ML LL (SYRINGE) ×2 IMPLANT
TOWEL OR 17X24 6PK STRL BLUE (TOWEL DISPOSABLE) ×2 IMPLANT
UNDERPAD 30X30 (UNDERPADS AND DIAPERS) IMPLANT

## 2015-11-21 NOTE — Anesthesia Procedure Notes (Signed)
Anesthesia Regional Block:  Bier block (IV Regional)  Pre-Anesthetic Checklist: ,, timeout performed, Correct Patient, Correct Site, Correct Laterality, Correct Procedure,, site marked, surgical consent,, at surgeon's request Needles:  Injection technique: Single-shot  Needle Type: Other      Needle Gauge: 20 and 20 G    Additional Needles: Bier block (IV Regional) Narrative:   Performed by: Personally       

## 2015-11-21 NOTE — Anesthesia Preprocedure Evaluation (Addendum)
Anesthesia Evaluation  Patient identified by MRN, date of birth, ID band Patient awake    Reviewed: Allergy & Precautions, NPO status , Patient's Chart, lab work & pertinent test results  Airway Mallampati: II  TM Distance: >3 FB Neck ROM: Full    Dental no notable dental hx.    Pulmonary neg pulmonary ROS, Current Smoker,    Pulmonary exam normal breath sounds clear to auscultation       Cardiovascular negative cardio ROS Normal cardiovascular exam Rhythm:Regular Rate:Normal     Neuro/Psych Anxiety negative neurological ROS  negative psych ROS   GI/Hepatic negative GI ROS, Neg liver ROS,   Endo/Other  negative endocrine ROSMorbid obesity  Renal/GU negative Renal ROS  negative genitourinary   Musculoskeletal negative musculoskeletal ROS (+)   Abdominal   Peds negative pediatric ROS (+)  Hematology negative hematology ROS (+)   Anesthesia Other Findings   Reproductive/Obstetrics negative OB ROS                             Anesthesia Physical  Anesthesia Plan  ASA: II  Anesthesia Plan: Bier Block and MAC   Post-op Pain Management:    Induction:   Airway Management Planned: Natural Airway  Additional Equipment:   Intra-op Plan:   Post-operative Plan:   Informed Consent: I have reviewed the patients History and Physical, chart, labs and discussed the procedure including the risks, benefits and alternatives for the proposed anesthesia with the patient or authorized representative who has indicated his/her understanding and acceptance.   Dental advisory given  Plan Discussed with: CRNA  Anesthesia Plan Comments:         Anesthesia Quick Evaluation  

## 2015-11-21 NOTE — Anesthesia Postprocedure Evaluation (Signed)
Anesthesia Post Note  Patient: Samantha Willis [place]  Procedure(s) Performed: Procedure(s) (LRB): CARPAL TUNNEL RELEASE right (Right)  Patient location during evaluation: PACU Anesthesia Type: MAC and Regional Level of consciousness: awake and alert Pain management: pain level controlled Vital Signs Assessment: post-procedure vital signs reviewed and stable Respiratory status: spontaneous breathing, nonlabored ventilation, respiratory function stable and patient connected to nasal cannula oxygen Cardiovascular status: stable and blood pressure returned to baseline Anesthetic complications: no    Last Vitals:  Vitals:   11/21/15 1032 11/21/15 1051  BP:  120/80  Pulse: 64   Resp: 13 16  Temp:  36.7 C    Last Pain:  Vitals:   11/21/15 1051  TempSrc:   PainSc: 0-No pain                 Montez Hageman

## 2015-11-21 NOTE — Op Note (Signed)
Dictation Number [id number]

## 2015-11-21 NOTE — H&P (Signed)
Samantha Willis is an 40 y.o. female.   Chief Complaint: numbness right hand HPI: Samantha Willis is a 40 year old right hand dominant female referred by Dr. Margette Fast for a consultation regarding carpal tunnel syndrome. She has had nerve conductions done by him revealing bilateral carpal tunnel syndrome. She has had problems in her neck and had an MRI of her neck. She states this has been going on for approximately 3 months right greater than left with relatively constant numbness in the median nerve distribution primarily on the right side and intermittent on the left. She has no history of injury to the hand or neck. She is awakened 3 out of 7 nights. She was given muscle relaxers and told to take ibuprofen and given a splint. This has not relieved symptoms for her. She states shaking her hands seems to improve this for her. Nothing seems to make it worse. She has no history of diabetes. She does have a history of Grave's disease. No history of arthritis or gout.   FAMILY HISTORY: Positive for diabetes and gout. She has been tested for diabetes.  . She does smoke  PPD and is advised to quit and the reasons behind this.  Past Medical History:  Diagnosis Date  . Graves disease   Past Surgical History:  Procedure Laterality Date  . CESAREAN SECTION  . TUBAL LIGATION       Past Medical History:  Diagnosis Date  . Anxiety   . Carpal tunnel syndrome 05/06/2014    Bilateral  . Graves disease   . Obesity     Past Surgical History:  Procedure Laterality Date  . CESAREAN SECTION    . TUBAL LIGATION  2011  . WISDOM TOOTH EXTRACTION      Family History  Problem Relation Age of Onset  . Heart attack Mother   . Cancer Mother 78    breast  . Diabetes Mother     II  . Hypertension Father   . Hyperlipidemia Father   . Gout Father   . Healthy Sister   . Healthy Brother   . Healthy Brother    Social History:  reports that she has been smoking.  She has been smoking about 0.25  packs per day. She has never used smokeless tobacco. She reports that she drinks alcohol. She reports that she does not use drugs.  Allergies: No Known Allergies  No prescriptions prior to admission.    No results found for this or any previous visit (from the past 48 hour(s)).  No results found.   Pertinent items are noted in HPI.  Height 5\' 3"  (1.6 m), weight 79.4 kg (175 lb).  General appearance: alert, cooperative and appears stated age Head: Normocephalic, without obvious abnormality Neck: no JVD Resp: clear to auscultation bilaterally Cardio: regular rate and rhythm, S1, S2 normal, no murmur, click, rub or gallop GI: soft, non-tender; bowel sounds normal; no masses,  no organomegaly Extremities: numbness right hand Pulses: 2+ and symmetric Skin: Skin color, texture, turgor normal. No rashes or lesions Neurologic: Grossly normal Incision/Wound: na  Assessment/Plan  DIAGNOSIS: Bilateral carpal tunnel syndrome.  Plan: right carpal tunnel release. Pre, peri and postoperative course were discussed along with the risks and complications.  The patient is aware there is no guarantee with the surgery, possibility of infection, recurrence, injury to arteries, nerves, tendons, incomplete relief of symptoms and dystrophy.      Nollie Terlizzi R 11/21/2015, 4:31 AM

## 2015-11-21 NOTE — Anesthesia Procedure Notes (Signed)
Procedure Name: MAC Date/Time: 11/21/2015 9:15 AM Performed by: Crit Obremski D Pre-anesthesia Checklist: Patient identified, Emergency Drugs available, Suction available, Patient being monitored and Timeout performed Patient Re-evaluated:Patient Re-evaluated prior to inductionOxygen Delivery Method: Simple face mask

## 2015-11-21 NOTE — Discharge Instructions (Addendum)

## 2015-11-21 NOTE — Brief Op Note (Signed)
11/21/2015  9:49 AM  PATIENT:  Samantha Willis  40 y.o. female  PRE-OPERATIVE DIAGNOSIS:  right carpal tunnel  POST-OPERATIVE DIAGNOSIS:  right carpal tunnel  PROCEDURE:  Procedure(s) with comments: CARPAL TUNNEL RELEASE right (Right) - FAB  SURGEON:  Surgeon(s) and Role:    * Daryll Brod, MD - Primary  PHYSICIAN ASSISTANT:   ASSISTANTS: none   ANESTHESIA:   local and regional  EBL:  Total I/O In: 400 [I.V.:400] Out: -   BLOOD ADMINISTERED:none  DRAINS: none   LOCAL MEDICATIONS USED:  BUPIVICAINE   SPECIMEN:  No Specimen  DISPOSITION OF SPECIMEN:  N/A  COUNTS:  YES  TOURNIQUET:   Total Tourniquet Time Documented: Forearm (Right) - 18 minutes Total: Forearm (Right) - 18 minutes   DICTATION: .Other Dictation: Dictation Number [id number]  PLAN OF CARE: Discharge to home after PACU  PATIENT DISPOSITION:  PACU - hemodynamically stable.

## 2015-11-21 NOTE — Transfer of Care (Signed)
Immediate Anesthesia Transfer of Care Note  Patient: Samantha Willis  Procedure(s) Performed: Procedure(s) with comments: CARPAL TUNNEL RELEASE right (Right) - FAB  Patient Location: PACU  Anesthesia Type:Bier block  Level of Consciousness: awake, alert , oriented and patient cooperative  Airway & Oxygen Therapy: Patient Spontanous Breathing  Post-op Assessment: Report given to RN and Post -op Vital signs reviewed and stable  Post vital signs: Reviewed and stable  Last Vitals:  Vitals:   11/21/15 0821  BP: 116/73  Pulse: 93  Resp: 18  Temp: 36.9 C    Last Pain:  Vitals:   11/21/15 0821  TempSrc: Oral         Complications: No apparent anesthesia complications

## 2015-11-22 ENCOUNTER — Encounter (HOSPITAL_BASED_OUTPATIENT_CLINIC_OR_DEPARTMENT_OTHER): Payer: Self-pay | Admitting: Orthopedic Surgery

## 2015-11-22 ENCOUNTER — Encounter: Payer: Medicaid Other | Admitting: Advanced Practice Midwife

## 2015-11-22 NOTE — Op Note (Signed)
NAME:  Samantha Willis, Samantha Willis            ACCOUNT NO.:  [id number]  MEDICAL RECORD NO.:  [id number]  LOCATION:                                 FACILITY:  PHYSICIAN:  Daryll Brod, M.D.       DATE OF BIRTH:  02-12-1975  DATE OF PROCEDURE:  11/19/2015 DATE OF DISCHARGE:                              OPERATIVE REPORT   PREOPERATIVE DIAGNOSIS:  Carpal tunnel syndrome, right hand.  POSTOPERATIVE DIAGNOSIS:  Carpal tunnel syndrome, right hand.  OPERATION:  Decompression of right median nerve.  SURGEON:  Daryll Brod, MD.  ANESTHESIA:  Forearm-based IV regional with local infiltration.  Place of the surgery:  [facility].  ANESTHESIOLOGIST:  Daisy Lazar.  HISTORY:  The patient is a 40 year old female with a history of carpal tunnel syndrome.  Bilateral EMG nerve conductions are positive revealing carpal tunnel syndrome.  She has elected to undergo surgical release. Pre, peri, and postoperative courses have been discussed along with risks and complications.  She is aware that there is no guarantee with the surgery; the possibility of infection; recurrence of injury to arteries, nerves, tendons; incomplete relief of symptoms; and dystrophy. In the preoperative area, the patient is seen, the extremity marked by both patient and surgeon.  Antibiotic given.  PROCEDURE IN DETAIL:  The patient was brought to the operating room, where a forearm-based IV regional anesthetic was carried out without difficulty under the direction of Anesthesia.  She was prepped using ChloraPrep in supine position with the right arm free.  A 3-minute dry time was allowed and time-out taken confirming the patient and procedure.  A longitudinal incision was made in the right palm, carried down through subcutaneous tissue.  Bleeders were electrocauterized with bipolar.  Palmar fascia was split and superficial palmar arch identified.  The flexor tendon to the ring finger was identified. Retractors were placed  protecting the median nerve radially and the ulnar nerve ulnarly.  The flexor retinaculum was incised with sharp dissection on its ulnar border.  A right angle and Sewall retractor were placed between skin and forearm fascia.  This was done proximally after separating the fascia and subcutaneous tissue.  The nerve was then dissected free from the overlying fascia.  With blunt scissors, the flexor retinaculum proximally and the distal forearm fascia released for approximately 2 cm proximal to the wrist crease under direct vision. The canal was explored.  The area compression to the median nerve was immediately apparent.  The motor branch entered into muscle distally, no further lesions were found.  The wound was copiously irrigated with saline.  The skin was then closed with interrupted 4-0 nylon sutures.  A local infiltration with 0.25% bupivacaine without epinephrine was given, 9 mL was used.  A sterile compressive dressing with fingers free was applied.  On deflation of the tourniquet, all fingers immediately pinked.  She was taken to the recovery room for observation in satisfactory condition.  She will be discharged to home to return to the [facility] in 1 week, on Norco.          ______________________________ Daryll Brod, M.D.     GK/MEDQ  D:  11/21/2015  T:  11/22/2015  Job:  [id number]

## 2016-05-30 ENCOUNTER — Other Ambulatory Visit: Payer: Self-pay | Admitting: Orthopedic Surgery

## 2016-06-05 ENCOUNTER — Encounter (HOSPITAL_BASED_OUTPATIENT_CLINIC_OR_DEPARTMENT_OTHER): Payer: Self-pay | Admitting: *Deleted

## 2016-06-05 NOTE — Progress Notes (Signed)
Requested last office note and Ekg from Dr. Doylene Canard. Bring all medications.

## 2016-06-11 ENCOUNTER — Ambulatory Visit (HOSPITAL_BASED_OUTPATIENT_CLINIC_OR_DEPARTMENT_OTHER)
Admission: RE | Admit: 2016-06-11 | Discharge: 2016-06-11 | Disposition: A | Discharge status: Home / Self Care | Payer: Medicaid Other | Source: Ambulatory Visit | Attending: Orthopedic Surgery | Admitting: Orthopedic Surgery

## 2016-06-11 ENCOUNTER — Encounter (HOSPITAL_BASED_OUTPATIENT_CLINIC_OR_DEPARTMENT_OTHER)
Admission: RE | Disposition: A | Discharge status: Home / Self Care | Payer: Self-pay | Source: Ambulatory Visit | Attending: Orthopedic Surgery

## 2016-06-11 ENCOUNTER — Ambulatory Visit (HOSPITAL_BASED_OUTPATIENT_CLINIC_OR_DEPARTMENT_OTHER): Payer: Medicaid Other | Admitting: Anesthesiology

## 2016-06-11 ENCOUNTER — Encounter (HOSPITAL_BASED_OUTPATIENT_CLINIC_OR_DEPARTMENT_OTHER): Payer: Self-pay | Admitting: Anesthesiology

## 2016-06-11 DIAGNOSIS — Z833 Family history of diabetes mellitus: Secondary | ICD-10-CM | POA: Diagnosis not present

## 2016-06-11 DIAGNOSIS — E669 Obesity, unspecified: Secondary | ICD-10-CM | POA: Insufficient documentation

## 2016-06-11 DIAGNOSIS — Z6834 Body mass index (BMI) 34.0-34.9, adult: Secondary | ICD-10-CM | POA: Diagnosis not present

## 2016-06-11 DIAGNOSIS — F17200 Nicotine dependence, unspecified, uncomplicated: Secondary | ICD-10-CM | POA: Insufficient documentation

## 2016-06-11 DIAGNOSIS — Z8249 Family history of ischemic heart disease and other diseases of the circulatory system: Secondary | ICD-10-CM | POA: Diagnosis not present

## 2016-06-11 DIAGNOSIS — Z803 Family history of malignant neoplasm of breast: Secondary | ICD-10-CM | POA: Diagnosis not present

## 2016-06-11 DIAGNOSIS — G5602 Carpal tunnel syndrome, left upper limb: Secondary | ICD-10-CM | POA: Insufficient documentation

## 2016-06-11 DIAGNOSIS — F419 Anxiety disorder, unspecified: Secondary | ICD-10-CM | POA: Insufficient documentation

## 2016-06-11 DIAGNOSIS — K219 Gastro-esophageal reflux disease without esophagitis: Secondary | ICD-10-CM | POA: Diagnosis not present

## 2016-06-11 DIAGNOSIS — E0500 Thyrotoxicosis with diffuse goiter without thyrotoxic crisis or storm: Secondary | ICD-10-CM | POA: Insufficient documentation

## 2016-06-11 DIAGNOSIS — Z8379 Family history of other diseases of the digestive system: Secondary | ICD-10-CM | POA: Diagnosis not present

## 2016-06-11 HISTORY — DX: Palpitations: R00.2

## 2016-06-11 HISTORY — PX: CARPAL TUNNEL RELEASE: SHX101

## 2016-06-11 HISTORY — DX: Gastro-esophageal reflux disease without esophagitis: K21.9

## 2016-06-11 SURGERY — CARPAL TUNNEL RELEASE
Anesthesia: Monitor Anesthesia Care | Site: Wrist | Laterality: Left

## 2016-06-11 MED ORDER — PROPOFOL 10 MG/ML IV BOLUS
INTRAVENOUS | Status: AC
  Filled 2016-06-11: qty 20

## 2016-06-11 MED ORDER — LIDOCAINE HCL (PF) 0.5 % IJ SOLN
INTRAMUSCULAR | Status: DC | PRN
  Administered 2016-06-11: 30 mL via INTRAVENOUS

## 2016-06-11 MED ORDER — OXYCODONE HCL 5 MG/5ML PO SOLN: 5.0000 mg | Freq: Once | ORAL | Status: AC | PRN

## 2016-06-11 MED ORDER — HYDROMORPHONE HCL 1 MG/ML IJ SOLN: 0.2500 mg | INTRAMUSCULAR | Status: DC | PRN

## 2016-06-11 MED ORDER — CEFAZOLIN SODIUM-DEXTROSE 2-4 GM/100ML-% IV SOLN
2.0000 g | INTRAVENOUS | Status: AC
  Administered 2016-06-11: 2 g via INTRAVENOUS

## 2016-06-11 MED ORDER — SCOPOLAMINE 1 MG/3DAYS TD PT72: 1.0000 | MEDICATED_PATCH | Freq: Once | TRANSDERMAL | Status: DC | PRN

## 2016-06-11 MED ORDER — HYDROCODONE-ACETAMINOPHEN 5-325 MG PO TABS: 1.0000 | ORAL_TABLET | Freq: Four times a day (QID) | ORAL | 0 refills | Status: DC | PRN

## 2016-06-11 MED ORDER — MIDAZOLAM HCL 2 MG/2ML IJ SOLN: 1.0000 mg | INTRAMUSCULAR | Status: DC | PRN

## 2016-06-11 MED ORDER — PROPOFOL 10 MG/ML IV BOLUS
INTRAVENOUS | Status: DC | PRN
  Administered 2016-06-11: 10 mg via INTRAVENOUS

## 2016-06-11 MED ORDER — FENTANYL CITRATE (PF) 100 MCG/2ML IJ SOLN: 50.0000 ug | INTRAMUSCULAR | Status: DC | PRN

## 2016-06-11 MED ORDER — LACTATED RINGERS IV SOLN
INTRAVENOUS | Status: DC
  Administered 2016-06-11: 10:00:00 via INTRAVENOUS

## 2016-06-11 MED ORDER — BUPIVACAINE HCL (PF) 0.25 % IJ SOLN
INTRAMUSCULAR | Status: DC | PRN
  Administered 2016-06-11: 8 mL

## 2016-06-11 MED ORDER — CHLORHEXIDINE GLUCONATE 4 % EX LIQD: 60.0000 mL | Freq: Once | CUTANEOUS | Status: DC

## 2016-06-11 MED ORDER — CEFAZOLIN SODIUM-DEXTROSE 2-4 GM/100ML-% IV SOLN
INTRAVENOUS | Status: AC
  Filled 2016-06-11: qty 100

## 2016-06-11 MED ORDER — OXYCODONE HCL 5 MG PO TABS
5.0000 mg | ORAL_TABLET | Freq: Once | ORAL | Status: AC | PRN
  Administered 2016-06-11: 5 mg via ORAL

## 2016-06-11 MED ORDER — ONDANSETRON HCL 4 MG/2ML IJ SOLN
INTRAMUSCULAR | Status: DC | PRN
  Administered 2016-06-11: 4 mg via INTRAVENOUS

## 2016-06-11 MED ORDER — PROMETHAZINE HCL 25 MG/ML IJ SOLN: 6.2500 mg | INTRAMUSCULAR | Status: DC | PRN

## 2016-06-11 MED ORDER — MIDAZOLAM HCL 5 MG/5ML IJ SOLN
INTRAMUSCULAR | Status: DC | PRN
  Administered 2016-06-11: 2 mg via INTRAVENOUS

## 2016-06-11 MED ORDER — ONDANSETRON HCL 4 MG/2ML IJ SOLN
INTRAMUSCULAR | Status: AC
  Filled 2016-06-11: qty 2

## 2016-06-11 MED ORDER — MEPERIDINE HCL 25 MG/ML IJ SOLN
6.2500 mg | INTRAMUSCULAR | Status: DC | PRN
Start: 2016-06-11 — End: 2016-06-11

## 2016-06-11 MED ORDER — FENTANYL CITRATE (PF) 100 MCG/2ML IJ SOLN
INTRAMUSCULAR | Status: DC | PRN
  Administered 2016-06-11: 100 ug via INTRAVENOUS

## 2016-06-11 MED ORDER — LIDOCAINE 2% (20 MG/ML) 5 ML SYRINGE
INTRAMUSCULAR | Status: AC
  Filled 2016-06-11: qty 5

## 2016-06-11 MED ORDER — FENTANYL CITRATE (PF) 100 MCG/2ML IJ SOLN
INTRAMUSCULAR | Status: AC
  Filled 2016-06-11: qty 2

## 2016-06-11 MED ORDER — OXYCODONE HCL 5 MG PO TABS
ORAL_TABLET | ORAL | Status: AC
  Filled 2016-06-11: qty 1

## 2016-06-11 MED ORDER — MIDAZOLAM HCL 2 MG/2ML IJ SOLN
INTRAMUSCULAR | Status: AC
  Filled 2016-06-11: qty 2

## 2016-06-11 SURGICAL SUPPLY — 33 items
BLADE SURG 15 STRL LF DISP TIS (BLADE) ×1 IMPLANT
BLADE SURG 15 STRL SS (BLADE) ×2
BNDG COHESIVE 3X5 TAN STRL LF (GAUZE/BANDAGES/DRESSINGS) ×3 IMPLANT
BNDG ESMARK 4X9 LF (GAUZE/BANDAGES/DRESSINGS) IMPLANT
BNDG GAUZE ELAST 4 BULKY (GAUZE/BANDAGES/DRESSINGS) ×3 IMPLANT
CHLORAPREP W/TINT 26ML (MISCELLANEOUS) ×3 IMPLANT
CORDS BIPOLAR (ELECTRODE) ×3 IMPLANT
COVER BACK TABLE 60X90IN (DRAPES) ×3 IMPLANT
COVER MAYO STAND STRL (DRAPES) ×3 IMPLANT
CUFF TOURNIQUET SINGLE 18IN (TOURNIQUET CUFF) ×3 IMPLANT
DRAPE EXTREMITY T 121X128X90 (DRAPE) ×3 IMPLANT
DRAPE SURG 17X23 STRL (DRAPES) ×3 IMPLANT
DRSG PAD ABDOMINAL 8X10 ST (GAUZE/BANDAGES/DRESSINGS) ×3 IMPLANT
GAUZE SPONGE 4X4 12PLY STRL (GAUZE/BANDAGES/DRESSINGS) ×3 IMPLANT
GAUZE XEROFORM 1X8 LF (GAUZE/BANDAGES/DRESSINGS) ×3 IMPLANT
GLOVE BIO SURGEON STRL SZ7.5 (GLOVE) ×3 IMPLANT
GLOVE BIOGEL PI IND STRL 8.5 (GLOVE) ×1 IMPLANT
GLOVE BIOGEL PI INDICATOR 8.5 (GLOVE) ×2
GLOVE SURG ORTHO 8.0 STRL STRW (GLOVE) ×3 IMPLANT
GOWN STRL REUS W/ TWL LRG LVL3 (GOWN DISPOSABLE) IMPLANT
GOWN STRL REUS W/ TWL XL LVL3 (GOWN DISPOSABLE) ×1 IMPLANT
GOWN STRL REUS W/TWL LRG LVL3 (GOWN DISPOSABLE)
GOWN STRL REUS W/TWL XL LVL3 (GOWN DISPOSABLE) ×5 IMPLANT
NEEDLE PRECISIONGLIDE 27X1.5 (NEEDLE) ×3 IMPLANT
NS IRRIG 1000ML POUR BTL (IV SOLUTION) ×3 IMPLANT
PACK BASIN DAY SURGERY FS (CUSTOM PROCEDURE TRAY) ×3 IMPLANT
STOCKINETTE 4X48 STRL (DRAPES) ×3 IMPLANT
SUT ETHILON 4 0 PS 2 18 (SUTURE) ×3 IMPLANT
SUT VICRYL 4-0 PS2 18IN ABS (SUTURE) IMPLANT
SYR BULB 3OZ (MISCELLANEOUS) ×3 IMPLANT
SYR CONTROL 10ML LL (SYRINGE) ×3 IMPLANT
TOWEL OR 17X24 6PK STRL BLUE (TOWEL DISPOSABLE) ×3 IMPLANT
UNDERPAD 30X30 (UNDERPADS AND DIAPERS) ×3 IMPLANT

## 2016-06-11 NOTE — Transfer of Care (Signed)
Immediate Anesthesia Transfer of Care Note  Patient: Samantha Willis  Procedure(s) Performed: Procedure(s) with comments: LEFT CARPAL TUNNEL RELEASE (Left) - REG/FAB  Patient Location: PACU  Anesthesia Type:Bier block  Level of Consciousness: awake and patient cooperative  Airway & Oxygen Therapy: Patient Spontanous Breathing and Patient connected to face mask oxygen  Post-op Assessment: Report given to RN and Post -op Vital signs reviewed and stable  Post vital signs: Reviewed and stable  Last Vitals:  Vitals:   06/11/16 0917  BP: 104/73  Pulse: 78  Resp: 18  Temp: 36.4 C    Last Pain:  Vitals:   06/11/16 0917  TempSrc: Oral      Patients Stated Pain Goal: 0 (71/06/26 9485)  Complications: No apparent anesthesia complications

## 2016-06-11 NOTE — Op Note (Signed)
NAME:  Samantha Willis,                     ACCOUNT NO.:  [id number]  MEDICAL RECORD NO.:  [id number]  LOCATION:                                 FACILITY:  PHYSICIAN:  Daryll Brod, M.D.            DATE OF BIRTH:  DATE OF PROCEDURE:  06/11/2016 DATE OF DISCHARGE:                              OPERATIVE REPORT   PREOPERATIVE DIAGNOSIS:  Carpal tunnel syndrome, left hand.  POSTOPERATIVE DIAGNOSIS:  Carpal tunnel syndrome, left hand.  OPERATION:  Decompression of left median nerve.  SURGEON:  Daryll Brod, MD.  ANESTHESIA:  Forearm-based IV regional with local infiltration.  PLACE OF SURGERY:  [facility].  [facility]  HISTORY:  The patient is a 41 year old female with history of bilateral carpal tunnel syndrome.  EMG nerve conduction is positive, which has not responded to conservative treatment.  She has undergone release on her right side and is admitted now for release to the left.  Pre, peri, and postoperative course have been discussed along with risks and complications.  She is aware that there is no guarantee to the surgery; the possibility of infection; recurrence of injury to arteries, nerves, tendons; incomplete relief of symptoms; and dystrophy.  In preoperative area, the patient was seen, the extremity marked by both patient and surgeon and antibiotics given.  PROCEDURE IN DETAIL:  The patient was brought to the operating room, where a forearm-based IV regional anesthetic was carried out without difficulty.  She was prepped using ChloraPrep in a supine position with the left arm free.  A 3-minute dry time was allowed, and time-out taken, confirming the patient and procedure.  A longitudinal incision was made in the left palm, carried down through subcutaneous tissue.  Bleeders were electrocauterized with bipolar.  Palmar fascia was split.  The superficial palmar arch was identified.  The flexor tendon to the ring and little finger was identified.   Retractors were placed retracting this radially along with the median nerve and the ulnar nerve ulnarly. Sharp dissection of the flexor retinaculum was incised.  A right angle and Sewell retractor were placed between skin and forearm fascia.  The underlying neurovascular structures were dissected free with blunt scissors, and a blunt scissors was then used to transect the proximal aspect of the flexor retinaculum and distal aspect of the forearm fascia for approximately 2 cm proximal to the wrist crease under direct vision. Canal was explored.  An area of compression to the nerve was apparent. Motor branch entered muscle distally.  The wound was copiously irrigated with saline.  The skin was then closed with interrupted 4-0 nylon sutures.  A local infiltration with 0.25% bupivacaine without epinephrine was given.  Approximately 8 mL was used.  A sterile compressive dressing with the fingers free was applied. On deflation of the tourniquet, all fingers immediately pinked.  She was taken to the recovery room for observation in satisfactory condition. She will be discharged to home to return to the [facility] in 1 week, on Norco.          ______________________________ Daryll Brod, M.D.  GK/MEDQ  D:  06/11/2016  T:  06/11/2016  Job:  [id number]

## 2016-06-11 NOTE — Anesthesia Postprocedure Evaluation (Signed)
Anesthesia Post Note  Patient: Samantha Willis  Procedure(s) Performed: Procedure(s) (LRB): LEFT CARPAL TUNNEL RELEASE (Left)  Patient location during evaluation: PACU Anesthesia Type: MAC Level of consciousness: awake and alert Pain management: pain level controlled Vital Signs Assessment: post-procedure vital signs reviewed and stable Respiratory status: spontaneous breathing, nonlabored ventilation and respiratory function stable Cardiovascular status: stable and blood pressure returned to baseline Anesthetic complications: no       Last Vitals:  Vitals:   06/11/16 1045 06/11/16 1057  BP: 106/72 112/84  Pulse: (!) 54 (!) 58  Resp: 14   Temp:  36.4 C    Last Pain:  Vitals:   06/11/16 1057  TempSrc: Oral  PainSc: 0-No pain                 Lynda Rainwater

## 2016-06-11 NOTE — Discharge Instructions (Addendum)

## 2016-06-11 NOTE — Brief Op Note (Signed)
06/11/2016  10:31 AM  PATIENT:  Taviana N [place]  41 y.o. female  PRE-OPERATIVE DIAGNOSIS:  LEFT CARPAL TUNNEL SYNDROME  POST-OPERATIVE DIAGNOSIS:  LEFT CARPAL TUNNEL SYNDROME  PROCEDURE:  Procedure(s) with comments: LEFT CARPAL TUNNEL RELEASE (Left) - REG/FAB  SURGEON:  Surgeon(s) and Role:    * Daryll Brod, MD - Primary  PHYSICIAN ASSISTANT:   ASSISTANTS: none   ANESTHESIA:   local and regional  EBL:  Total I/O In: -  Out: 2 [Blood:2]  BLOOD ADMINISTERED:none  DRAINS: none   LOCAL MEDICATIONS USED:  BUPIVICAINE   SPECIMEN:  No Specimen  DISPOSITION OF SPECIMEN:  N/A  COUNTS:  YES  TOURNIQUET:   Total Tourniquet Time Documented: Forearm (Left) - 22 minutes Total: Forearm (Left) - 22 minutes   DICTATION: .Other Dictation: Dictation Number [id number]  PLAN OF CARE: Discharge to home after PACU  PATIENT DISPOSITION:  PACU - hemodynamically stable.

## 2016-06-11 NOTE — H&P (Signed)
Samantha Willis [place] is an 41 y.o. female.   Chief Complaint: numbness left hand HPI: Ms. [place] is a 41 year old right hand dominant female referred by Dr. Margette Fast for a consultation regarding carpal tunnel syndrome. She has had nerve conductions done by him revealing bilateral carpal tunnel syndrome. She has had problems in her neck and had an MRI of her neck. She states this has been going on for approximately 3 months right greater than left with relatively constant numbness in the median nerve distribution primarily on the right side and intermittent on the left. She has no history of injury to the hand or neck. She is awakened 3 out of 7 nights. She was given muscle relaxers and told to take ibuprofen and given a splint. This has not relieved symptoms for her. She states shaking her hands seems to improve this for her. Nothing seems to make it worse. She has no history of diabetes. She does have a history of Grave's disease. No history of arthritis or gout. She has had a carpal tunnel release right hand.  She had an injection to the carpal canal left side . She states this gave her excellent relief of symptoms but it is now beginning to bother her again with numbness or tingling. She is status post carpal tunnel release on her right side. This done extremely well.      Past Medical History:  Diagnosis Date  . Anxiety   . Carpal tunnel syndrome 05/06/2014    Bilateral  . GERD (gastroesophageal reflux disease)   . Graves disease    Graves Disease  . Heart palpitations   . Obesity     Past Surgical History:  Procedure Laterality Date  . Biopsy of Right Breast     At [place] Right 11/21/2015   Procedure: CARPAL TUNNEL RELEASE right;  Surgeon: Daryll Brod, MD;  Location: [place] [facility];  Service: Orthopedics;  Laterality: Right;  FAB  . CESAREAN SECTION    . TUBAL LIGATION  2011  . WISDOM TOOTH EXTRACTION      Family History   Problem Relation Age of Onset  . Heart attack Mother   . Cancer Mother 55       breast  . Diabetes Mother        II  . Hypertension Father   . Hyperlipidemia Father   . Gout Father   . Healthy Sister   . Healthy Brother   . Healthy Brother    Social History:  reports that she has been smoking.  She has been smoking about 0.25 packs per day. She has never used smokeless tobacco. She reports that she drinks alcohol. She reports that she does not use drugs.  Allergies: No Known Allergies  No prescriptions prior to admission.    No results found for this or any previous visit (from the past 48 hour(s)).  No results found.   Pertinent items are noted in HPI.  Height 5\' 3"  (1.6 m), weight 80.7 kg (178 lb), last menstrual period 05/30/2016.  General appearance: alert, cooperative and appears stated age Head: Normocephalic, without obvious abnormality Neck: no JVD Resp: clear to auscultation bilaterally Cardio: regular rate and rhythm, S1, S2 normal, no murmur, click, rub or gallop GI: soft, non-tender; bowel sounds normal; no masses,  no organomegaly Extremities: numbness left hand Pulses: 2+ and symmetric Skin: Skin color, texture, turgor normal. No rashes or lesions Neurologic: Grossly normal Incision/Wound: na  Assessment/Plan  Diagnosis carpal tunnel syndrome left hand.  Plan She would like to proceed to have this released. Pre-peri-and postoperative course are discussed along with risks and complications. She is aware that there is no guarantee to the surgery the possibility of infection recurrence injury to arteries nerves tendons complete relief symptoms dystrophy. She is scheduled for left carpal tunnel release and outpatient under regional anesthesia.      Anaih Brander R 06/11/2016, 5:49 AM

## 2016-06-11 NOTE — Anesthesia Preprocedure Evaluation (Signed)
Anesthesia Evaluation  Patient identified by MRN, date of birth, ID band Patient awake    Reviewed: Allergy & Precautions, NPO status , Patient's Chart, lab work & pertinent test results  Airway Mallampati: II  TM Distance: >3 FB Neck ROM: Full    Dental no notable dental hx.    Pulmonary neg pulmonary ROS, Current Smoker,    Pulmonary exam normal breath sounds clear to auscultation       Cardiovascular negative cardio ROS Normal cardiovascular exam Rhythm:Regular Rate:Normal     Neuro/Psych Anxiety negative neurological ROS  negative psych ROS   GI/Hepatic negative GI ROS, Neg liver ROS,   Endo/Other  negative endocrine ROSMorbid obesity  Renal/GU negative Renal ROS  negative genitourinary   Musculoskeletal negative musculoskeletal ROS (+)   Abdominal   Peds negative pediatric ROS (+)  Hematology negative hematology ROS (+)   Anesthesia Other Findings   Reproductive/Obstetrics negative OB ROS                             Anesthesia Physical  Anesthesia Plan  ASA: II  Anesthesia Plan: Bier Block and MAC   Post-op Pain Management:    Induction:   Airway Management Planned: Natural Airway  Additional Equipment:   Intra-op Plan:   Post-operative Plan:   Informed Consent: I have reviewed the patients History and Physical, chart, labs and discussed the procedure including the risks, benefits and alternatives for the proposed anesthesia with the patient or authorized representative who has indicated his/her understanding and acceptance.   Dental advisory given  Plan Discussed with: CRNA  Anesthesia Plan Comments:         Anesthesia Quick Evaluation

## 2016-06-11 NOTE — Op Note (Signed)
Dictation Number [id number]

## 2016-06-12 ENCOUNTER — Encounter (HOSPITAL_BASED_OUTPATIENT_CLINIC_OR_DEPARTMENT_OTHER): Payer: Self-pay | Admitting: Orthopedic Surgery

## 2017-10-20 ENCOUNTER — Encounter (HOSPITAL_COMMUNITY): Payer: Self-pay | Admitting: Emergency Medicine

## 2017-10-20 ENCOUNTER — Emergency Department (HOSPITAL_COMMUNITY)
Admission: EM | Admit: 2017-10-20 | Discharge: 2017-10-20 | Disposition: A | Discharge status: Home / Self Care | Payer: Self-pay | Attending: Emergency Medicine | Admitting: Emergency Medicine

## 2017-10-20 DIAGNOSIS — F17210 Nicotine dependence, cigarettes, uncomplicated: Secondary | ICD-10-CM | POA: Insufficient documentation

## 2017-10-20 DIAGNOSIS — Z79899 Other long term (current) drug therapy: Secondary | ICD-10-CM | POA: Insufficient documentation

## 2017-10-20 DIAGNOSIS — L02412 Cutaneous abscess of left axilla: Secondary | ICD-10-CM | POA: Insufficient documentation

## 2017-10-20 MED ORDER — SULFAMETHOXAZOLE-TRIMETHOPRIM 800-160 MG PO TABS
1.0000 | ORAL_TABLET | Freq: Once | ORAL | Status: AC
  Administered 2017-10-20: 1 via ORAL
  Filled 2017-10-20: qty 1

## 2017-10-20 MED ORDER — LIDOCAINE HCL (PF) 1 % IJ SOLN
5.0000 mL | Freq: Once | INTRAMUSCULAR | Status: AC
  Administered 2017-10-20: 5 mL
  Filled 2017-10-20: qty 30

## 2017-10-20 MED ORDER — SULFAMETHOXAZOLE-TRIMETHOPRIM 800-160 MG PO TABS: 1.0000 | ORAL_TABLET | Freq: Two times a day (BID) | ORAL | 0 refills | Status: AC

## 2017-10-20 MED ORDER — OXYCODONE-ACETAMINOPHEN 5-325 MG PO TABS
1.0000 | ORAL_TABLET | Freq: Once | ORAL | Status: AC
  Administered 2017-10-20: 1 via ORAL
  Filled 2017-10-20: qty 1

## 2017-10-20 MED ORDER — LIDOCAINE HCL URETHRAL/MUCOSAL 2 % EX GEL
1.0000 "application " | Freq: Once | CUTANEOUS | Status: AC
  Administered 2017-10-20: 1 via TOPICAL
  Filled 2017-10-20: qty 5

## 2017-10-20 NOTE — ED Triage Notes (Signed)
Pt c/o abscess under left arm since last week. reports getting larger in size.

## 2017-10-20 NOTE — Discharge Instructions (Signed)
Apply warm wet compresses to the area several times a day. Take the antibiotics as directed, follow up for wound check in 2 days. Take tylenol and ibuprofen as needed for pain

## 2017-10-20 NOTE — ED Provider Notes (Signed)
[facility] DEPT Provider Note   CSN: [id number] Arrival date & time: 10/20/17  1623     History   Chief Complaint Chief Complaint  Patient presents with  . Abscess    HPI Samantha Willis is a 42 y.o. female who presents to the ED for an abscess to the left axilla. Patient reports symptoms started a week ago and have gotten much worse.   HPI  Past Medical History:  Diagnosis Date  . Anxiety   . Carpal tunnel syndrome 05/06/2014    Bilateral  . GERD (gastroesophageal reflux disease)   . Graves disease    Graves Disease  . Heart palpitations   . Obesity     Patient Active Problem List   Diagnosis Date Noted  . Carpal tunnel syndrome 05/06/2014    Past Surgical History:  Procedure Laterality Date  . Biopsy of Right Breast     At [place] Right 11/21/2015   Procedure: CARPAL TUNNEL RELEASE right;  Surgeon: Daryll Brod, MD;  Location: [facility];  Service: Orthopedics;  Laterality: Right;  FAB  . CARPAL TUNNEL RELEASE Left 06/11/2016   Procedure: LEFT CARPAL TUNNEL RELEASE;  Surgeon: Daryll Brod, MD;  Location: [facility];  Service: Orthopedics;  Laterality: Left;  REG/FAB  . CESAREAN SECTION    . TUBAL LIGATION  2011  . WISDOM TOOTH EXTRACTION       OB History   None      Home Medications    Prior to Admission medications   Medication Sig Start Date End Date Taking? Authorizing Provider  ALPRAZolam Duanne Moron) 0.25 MG tablet Take 0.25 mg by mouth at bedtime as needed for anxiety.    [provider]  Cholecalciferol (VITAMIN D3) 2000 UNITS TABS Take 1 tablet by mouth daily.    [provider]  HYDROcodone-acetaminophen (NORCO) 5-325 MG tablet Take 1 tablet by mouth every 6 (six) hours as needed for moderate pain. 06/11/16   Daryll Brod, MD  ibuprofen (ADVIL,MOTRIN) 200 MG tablet Take 200 mg by mouth every 8 (eight) hours as needed.    [provider]  meloxicam (MOBIC) 15 MG tablet Take 15 mg by mouth daily.    [provider]  metoprolol (LOPRESSOR) 100 MG tablet Take 100 mg by mouth once.    [provider]  omeprazole (PRILOSEC) 20 MG capsule Take 20 mg by mouth daily.    [provider]  sulfamethoxazole-trimethoprim (BACTRIM DS,SEPTRA DS) 800-160 MG tablet Take 1 tablet by mouth 2 (two) times daily for 7 days. 10/20/17 10/27/17  Ashley Murrain, NP    Family History Family History  Problem Relation Age of Onset  . Heart attack Mother   . Cancer Mother 67       breast  . Diabetes Mother        II  . Hypertension Father   . Hyperlipidemia Father   . Gout Father   . Healthy Sister   . Healthy Brother   . Healthy Brother     Social History Social History   Tobacco Use  . Smoking status: Current Every Day Smoker    Packs/day: 0.25  . Smokeless tobacco: Never Used  Substance Use Topics  . Alcohol use: Yes    Alcohol/week: 0.0 standard drinks    Comment: Occassionally  . Drug use: No     Allergies   Patient has no known allergies.   Review of  Systems Review of Systems  Skin: Positive for wound.       Abscess left axilla  All other systems reviewed and are negative.    Physical Exam Updated Vital Signs BP (!) 148/97 (BP Location: Right Arm)   Pulse 86   Temp 98.4 F (36.9 C) (Oral)   Resp 18   SpO2 99%   Physical Exam  Constitutional: She appears well-developed and well-nourished. No distress.  Eyes: EOM are normal.  Neck: Neck supple.  Cardiovascular: Normal rate.  Pulmonary/Chest: Effort normal.  Musculoskeletal:  Left axilla with 4 cm area of swelling and tenderness. No red streaking.   Neurological: She is alert.  Skin: Skin is warm and dry.  Psychiatric: Her mood appears anxious.  Nursing note and vitals reviewed.    ED Treatments / Results  Labs (all labs ordered are listed, but only abnormal results are displayed) Labs Reviewed - No data to  display  Radiology No results found.  Procedures .Marland KitchenIncision and Drainage Date/Time: 10/20/2017 8:37 PM Performed by: Ashley Murrain, NP Authorized by: Ashley Murrain, NP   Consent:    Consent obtained:  Verbal   Consent given by:  Patient   Risks discussed:  Incomplete drainage and pain   Alternatives discussed:  Alternative treatment Location:    Type:  Abscess   Location:  Upper extremity   Upper extremity location: left axilla. Pre-procedure details:    Skin preparation:  Betadine Anesthesia (see MAR for exact dosages):    Anesthesia method:  Local infiltration   Local anesthetic:  Lidocaine 1% w/o epi Procedure type:    Complexity:  Complex Procedure details:    Incision types:  Single straight   Incision depth:  Dermal   Scalpel blade:  11   Drainage:  Purulent   Drainage amount:  Copious   Wound treatment:  Wound left open   Packing materials:  None Post-procedure details:    Patient tolerance of procedure:  Tolerated well, no immediate complications   (including critical care time)  Medications Ordered in ED Medications  lidocaine (PF) (XYLOCAINE) 1 % injection 5 mL (has no administration in time range)  sulfamethoxazole-trimethoprim (BACTRIM DS,SEPTRA DS) 800-160 MG per tablet 1 tablet (has no administration in time range)  oxyCODONE-acetaminophen (PERCOCET/ROXICET) 5-325 MG per tablet 1 tablet (1 tablet Oral Given 10/20/17 1907)  lidocaine (XYLOCAINE) 2 % jelly 1 application (1 application Topical Given 10/20/17 1907)     Initial Impression / Assessment and Plan / ED Course  I have reviewed the triage vital signs and the nursing notes. 42 y.o. female here with abscess to the left axilla stable for d/c after I&D. Patient to f/u with PCP in 2 days for wound check. Will start Bactrim and patient will apply warm wet compresses several times a day.  Final Clinical Impressions(s) / ED Diagnoses   Final diagnoses:  Abscess of left axilla    ED Discharge Orders          Ordered    sulfamethoxazole-trimethoprim (BACTRIM DS,SEPTRA DS) 800-160 MG tablet  2 times daily     10/20/17 2036           Debroah Baller [place], [place] 10/20/17 2042    Lacretia Leigh, MD 10/20/17 [phone]

## 2019-05-18 ENCOUNTER — Other Ambulatory Visit: Payer: Self-pay

## 2019-05-19 ENCOUNTER — Ambulatory Visit (INDEPENDENT_AMBULATORY_CARE_PROVIDER_SITE_OTHER): Payer: 59 | Admitting: Internal Medicine

## 2019-05-19 ENCOUNTER — Encounter: Payer: Self-pay | Admitting: Internal Medicine

## 2019-05-19 VITALS — BP 110/70 | HR 74 | Temp 97.9°F | Ht 63.0 in | Wt 173.1 lb

## 2019-05-19 DIAGNOSIS — E0500 Thyrotoxicosis with diffuse goiter without thyrotoxic crisis or storm: Secondary | ICD-10-CM

## 2019-05-19 DIAGNOSIS — K219 Gastro-esophageal reflux disease without esophagitis: Secondary | ICD-10-CM

## 2019-05-19 DIAGNOSIS — L732 Hidradenitis suppurativa: Secondary | ICD-10-CM | POA: Diagnosis not present

## 2019-05-19 DIAGNOSIS — T8332XA Displacement of intrauterine contraceptive device, initial encounter: Secondary | ICD-10-CM | POA: Diagnosis not present

## 2019-05-19 DIAGNOSIS — Z1239 Encounter for other screening for malignant neoplasm of breast: Secondary | ICD-10-CM

## 2019-05-19 DIAGNOSIS — L0293 Carbuncle, unspecified: Secondary | ICD-10-CM | POA: Diagnosis not present

## 2019-05-19 LAB — T3, FREE: T3, Free: 3.5 pg/mL (ref 2.3–4.2)

## 2019-05-19 LAB — T4, FREE: Free T4: 0.81 ng/dL (ref 0.60–1.60)

## 2019-05-19 LAB — TSH: TSH: 2.65 u[IU]/mL (ref 0.35–4.50)

## 2019-05-19 NOTE — Progress Notes (Signed)
New Patient Office Visit     This visit occurred during the SARS-CoV-2 public health emergency.  Safety protocols were in place, including screening questions prior to the visit, additional usage of staff PPE, and extensive cleaning of exam room while observing appropriate contact time as indicated for disinfecting solutions.    CC/Reason for Visit: Establish care, discuss acute concerns Previous PCP: None Last Visit: Unknown  HPI: Samantha Willis is a 44 y.o. female who is coming in today for the above mentioned reasons. Past Medical History is significant for: Graves' disease, she was told in 2017 that she was "in remission" and has not been on medications since, has not had any thyroid function tests in that timeframe.  She also has a recurrent issue with hidradenitis suppurativa and GERD that is well controlled on daily PPI therapy.  She relates she has an increase in anxiety.  She feels jittery, like her heart is racing and has been very sweaty.  She saw her GYN as she wanted to have her Mirena removed and was told by GYN that it had migrated behind the fibroid and that she would need a surgical consultation to have it removed.  Her hydradenitis has been recently causing issues.  She states she has a large boil under her left armpit that she believes needs to be incised and drained.  She has been describing a lot of diarrhea.   Past Medical/Surgical History: Past Medical History:  Diagnosis Date  . Anxiety   . Carpal tunnel syndrome 05/06/2014    Bilateral  . GERD (gastroesophageal reflux disease)   . Graves disease    Graves Disease  . Heart palpitations   . Obesity     Past Surgical History:  Procedure Laterality Date  . Biopsy of Right Breast     At [place] Right 11/21/2015   Procedure: CARPAL TUNNEL RELEASE right;  Surgeon: Daryll Brod, MD;  Location: [facility];  Service: Orthopedics;  Laterality: Right;  FAB  .  CARPAL TUNNEL RELEASE Left 06/11/2016   Procedure: LEFT CARPAL TUNNEL RELEASE;  Surgeon: Daryll Brod, MD;  Location: [facility];  Service: Orthopedics;  Laterality: Left;  REG/FAB  . CESAREAN SECTION    . TUBAL LIGATION  2011  . WISDOM TOOTH EXTRACTION      Social History:  reports that she has been smoking. She has been smoking about 0.25 packs per day. She has never used smokeless tobacco. She reports current alcohol use. She reports that she does not use drugs.  Allergies: No Known Allergies  Family History:  Family History  Problem Relation Age of Onset  . Heart attack Mother   . Cancer Mother 46       breast  . Diabetes Mother        II  . Hypertension Father   . Hyperlipidemia Father   . Gout Father   . Healthy Sister   . Healthy Brother   . Healthy Brother      Current Outpatient Medications:  .  Cholecalciferol (VITAMIN D3) 2000 UNITS TABS, Take 1 tablet by mouth daily., Disp: , Rfl:  .  diphenhydrAMINE (BENADRYL) 25 MG tablet, Take 25 mg by mouth every 6 (six) hours as needed., Disp: , Rfl:  .  ibuprofen (ADVIL,MOTRIN) 200 MG tablet, Take 200 mg by mouth every 8 (eight) hours as needed., Disp: , Rfl:  .  metoprolol (LOPRESSOR) 100 MG tablet, Take 100 mg  by mouth once., Disp: , Rfl:   Review of Systems:  Constitutional: Denies fever, chills, diaphoresis, appetite change and fatigue.  HEENT: Denies photophobia, eye pain, redness, hearing loss, ear pain, congestion, sore throat, rhinorrhea, sneezing, mouth sores, trouble swallowing, neck pain, neck stiffness and tinnitus.   Respiratory: Denies SOB, DOE, cough, chest tightness,  and wheezing.   Cardiovascular: Denies chest pain and leg swelling.  Gastrointestinal: Denies nausea, vomiting, abdominal pain,  constipation, blood in stool and abdominal distention.  Genitourinary: Denies dysuria, urgency, frequency, hematuria, flank pain and difficulty urinating.  Endocrine: Denies: hot or cold intolerance,  sweats, changes in hair or nails, polyuria, polydipsia. Musculoskeletal: Denies myalgias, back pain, joint swelling, arthralgias and gait problem.  Skin: Denies pallor, rash and wound.  Neurological: Denies dizziness, seizures, syncope, weakness, light-headedness, numbness and headaches.  Hematological: Denies adenopathy. Easy bruising, personal or family bleeding history  Psychiatric/Behavioral: Denies suicidal ideation, mood changes, confusion, nervousness, sleep disturbance and agitation    Physical Exam: Vitals:   05/19/19 1112  BP: 110/70  Pulse: 74  Temp: 97.9 F (36.6 C)  TempSrc: Temporal  SpO2: 97%  Weight: 173 lb 1.6 oz (78.5 kg)  Height: 5\' 3"  (1.6 m)   Body mass index is 30.66 kg/m.  Constitutional: NAD, calm, comfortable Eyes: PERRL, lids and conjunctivae normal ENMT: Mucous membranes are moist.  Neck: normal, supple, no masses, no thyromegaly Respiratory: clear to auscultation bilaterally, no wheezing, no crackles. Normal respiratory effort. No accessory muscle use.  Cardiovascular: Regular rate and rhythm, no murmurs / rubs / gallops. No extremity edema.   Neurologic: Grossly intact and nonfocal Psychiatric: Normal judgment and insight. Alert and oriented x 3. Normal mood.    Impression and Plan:  Intrauterine device (IUD) migration, initial encounter  - Plan: Ambulatory referral to General Surgery per GYN recommendations.  Hidradenitis suppurativa of left axilla -Has a very large boil that present under her left axilla, surgical referral for consideration of incision and drainage.  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  -She has scored a 21 on gad 7 (highest score possible).  Again, I suspect that a lot of her anxiety symptoms are related to hyperthyroidism.  If thyroid function tests are normal, will start antianxiety medication.  Encounter for  screening breast examination  - Plan: MM Digital Screening      Patient Instructions  -Nice seeing you today!!  -Lab work today; will notify you once results are available.  -See you back in 3 months for your physical. Please come in fasting that day.     Lelon Frohlich, MD [facility] at [facility]

## 2019-05-19 NOTE — Patient Instructions (Signed)
-  Nice seeing you today!!  -Lab work today; will notify you once results are available.  -See you back in 3 months for your physical. Please come in fasting that day.

## 2019-05-21 ENCOUNTER — Telehealth (INDEPENDENT_AMBULATORY_CARE_PROVIDER_SITE_OTHER): Payer: 59 | Admitting: Internal Medicine

## 2019-05-21 ENCOUNTER — Other Ambulatory Visit: Payer: Self-pay | Admitting: Internal Medicine

## 2019-05-21 DIAGNOSIS — F411 Generalized anxiety disorder: Secondary | ICD-10-CM

## 2019-05-21 DIAGNOSIS — Z1239 Encounter for other screening for malignant neoplasm of breast: Secondary | ICD-10-CM

## 2019-05-21 MED ORDER — SERTRALINE HCL 50 MG PO TABS: 50.0000 mg | ORAL_TABLET | Freq: Every day | ORAL | 1 refills | Status: DC

## 2019-05-21 NOTE — Progress Notes (Signed)
Virtual Visit via Video Note  I connected with Samantha Willis on 05/21/19 at  4:00 PM EDT by a video enabled telemedicine application and verified that I am speaking with the correct person using two identifiers.  Location patient: home Location provider: work office Persons participating in the virtual visit: patient, provider  I discussed the limitations of evaluation and management by telemedicine and the availability of in person appointments. The patient expressed understanding and agreed to proceed.   HPI: This is a scheduled visit to discuss antianxiety treatment following an office visit earlier this week.  She scored a 21 on gad 7.  Please see prior visit for details of anxiety.  We deferred treating her at that time as there was some consideration that her symptoms might be due to untreated hyper thyroidism, however thyroid function tests returned normal.   ROS: Constitutional: Denies fever, chills, diaphoresis, appetite change and fatigue.  HEENT: Denies photophobia, eye pain, redness, hearing loss, ear pain, congestion, sore throat, rhinorrhea, sneezing, mouth sores, trouble swallowing, neck pain, neck stiffness and tinnitus.   Respiratory: Denies SOB, DOE, cough, chest tightness,  and wheezing.   Cardiovascular: Denies chest pain, palpitations and leg swelling.  Gastrointestinal: Denies nausea, vomiting, abdominal pain, diarrhea, constipation, blood in stool and abdominal distention.  Genitourinary: Denies dysuria, urgency, frequency, hematuria, flank pain and difficulty urinating.  Endocrine: Denies: hot or cold intolerance, sweats, changes in hair or nails, polyuria, polydipsia. Musculoskeletal: Denies myalgias, back pain, joint swelling, arthralgias and gait problem.  Skin: Denies pallor, rash and wound.  Neurological: Denies dizziness, seizures, syncope, weakness, light-headedness, numbness and headaches.  Hematological: Denies adenopathy. Easy bruising,  personal or family bleeding history  Psychiatric/Behavioral: Denies suicidal ideation, mood changes, confusion, nervousness, sleep disturbance and agitation   Past Medical History:  Diagnosis Date  . Anxiety   . Carpal tunnel syndrome 05/06/2014    Bilateral  . GERD (gastroesophageal reflux disease)   . Graves disease    Graves Disease  . Heart palpitations   . Obesity     Past Surgical History:  Procedure Laterality Date  . Biopsy of Right Breast     At [place] Right 11/21/2015   Procedure: CARPAL TUNNEL RELEASE right;  Surgeon: Daryll Brod, MD;  Location: [facility];  Service: Orthopedics;  Laterality: Right;  FAB  . CARPAL TUNNEL RELEASE Left 06/11/2016   Procedure: LEFT CARPAL TUNNEL RELEASE;  Surgeon: Daryll Brod, MD;  Location: [facility];  Service: Orthopedics;  Laterality: Left;  REG/FAB  . CESAREAN SECTION    . TUBAL LIGATION  2011  . WISDOM TOOTH EXTRACTION      Family History  Problem Relation Age of Onset  . Heart attack Mother   . Cancer Mother 55       breast  . Diabetes Mother        II  . Hypertension Father   . Hyperlipidemia Father   . Gout Father   . Healthy Sister   . Healthy Brother   . Healthy Brother     SOCIAL HX:   reports that she has been smoking. She has been smoking about 0.25 packs per day. She has never used smokeless tobacco. She reports current alcohol use. She reports that she does not use drugs.   Current Outpatient Medications:  .  Cholecalciferol (VITAMIN D3) 2000 UNITS TABS, Take 1 tablet by mouth daily., Disp: , Rfl:  .  diphenhydrAMINE (BENADRYL) 25  MG tablet, Take 25 mg by mouth every 6 (six) hours as needed., Disp: , Rfl:  .  ibuprofen (ADVIL,MOTRIN) 200 MG tablet, Take 200 mg by mouth every 8 (eight) hours as needed., Disp: , Rfl:  .  metoprolol (LOPRESSOR) 100 MG tablet, Take 100 mg by mouth once., Disp: , Rfl:  .  sertraline (ZOLOFT) 50 MG tablet, Take 1  tablet (50 mg total) by mouth daily., Disp: 90 tablet, Rfl: 1  EXAM:   VITALS per patient if applicable: none reported  GENERAL: alert, oriented, appears well and in no acute distress  HEENT: atraumatic, conjunttiva clear, no obvious abnormalities on inspection of external nose and ears  NECK: normal movements of the head and neck  LUNGS: on inspection no signs of respiratory distress, breathing rate appears normal, no obvious gross increased work of breathing, gasping or wheezing  CV: no obvious cyanosis  MS: moves all visible extremities without noticeable abnormality  PSYCH/NEURO: pleasant and cooperative, no obvious depression or anxiety, speech and thought processing grossly intact  ASSESSMENT AND PLAN:   GAD (generalized anxiety disorder) -Start Zoloft 50 mg daily. -She will return in 8 weeks for follow-up.    I discussed the assessment and treatment plan with the patient. The patient was provided an opportunity to ask questions and all were answered. The patient agreed with the plan and demonstrated an understanding of the instructions.   The patient was advised to call back or seek an in-person evaluation if the symptoms worsen or if the condition fails to improve as anticipated.    Lelon Frohlich, MD  [facility] at [facility]

## 2019-10-06 ENCOUNTER — Ambulatory Visit: Payer: 59 | Admitting: Internal Medicine

## 2019-11-17 ENCOUNTER — Emergency Department (HOSPITAL_COMMUNITY): Payer: 59

## 2019-11-17 ENCOUNTER — Encounter (HOSPITAL_COMMUNITY): Payer: Self-pay | Admitting: Emergency Medicine

## 2019-11-17 ENCOUNTER — Inpatient Hospital Stay (HOSPITAL_COMMUNITY)
Admission: EM | Admit: 2019-11-17 | Discharge: 2019-11-19 | DRG: 871 | Disposition: A | Discharge status: Home / Self Care | Payer: 59 | Attending: Family Medicine | Admitting: Family Medicine

## 2019-11-17 ENCOUNTER — Other Ambulatory Visit: Payer: Self-pay

## 2019-11-17 ENCOUNTER — Ambulatory Visit: Payer: 59 | Admitting: Internal Medicine

## 2019-11-17 DIAGNOSIS — Z720 Tobacco use: Secondary | ICD-10-CM | POA: Diagnosis present

## 2019-11-17 DIAGNOSIS — Z20822 Contact with and (suspected) exposure to covid-19: Secondary | ICD-10-CM | POA: Diagnosis present

## 2019-11-17 DIAGNOSIS — N179 Acute kidney failure, unspecified: Secondary | ICD-10-CM | POA: Diagnosis not present

## 2019-11-17 DIAGNOSIS — E1010 Type 1 diabetes mellitus with ketoacidosis without coma: Secondary | ICD-10-CM | POA: Diagnosis not present

## 2019-11-17 DIAGNOSIS — F17210 Nicotine dependence, cigarettes, uncomplicated: Secondary | ICD-10-CM | POA: Diagnosis present

## 2019-11-17 DIAGNOSIS — E6609 Other obesity due to excess calories: Secondary | ICD-10-CM

## 2019-11-17 DIAGNOSIS — I248 Other forms of acute ischemic heart disease: Secondary | ICD-10-CM | POA: Diagnosis present

## 2019-11-17 DIAGNOSIS — Z8249 Family history of ischemic heart disease and other diseases of the circulatory system: Secondary | ICD-10-CM | POA: Diagnosis not present

## 2019-11-17 DIAGNOSIS — R002 Palpitations: Secondary | ICD-10-CM | POA: Diagnosis not present

## 2019-11-17 DIAGNOSIS — F419 Anxiety disorder, unspecified: Secondary | ICD-10-CM | POA: Diagnosis present

## 2019-11-17 DIAGNOSIS — Z6830 Body mass index (BMI) 30.0-30.9, adult: Secondary | ICD-10-CM

## 2019-11-17 DIAGNOSIS — Z79899 Other long term (current) drug therapy: Secondary | ICD-10-CM | POA: Diagnosis not present

## 2019-11-17 DIAGNOSIS — E669 Obesity, unspecified: Secondary | ICD-10-CM | POA: Diagnosis present

## 2019-11-17 DIAGNOSIS — R030 Elevated blood-pressure reading, without diagnosis of hypertension: Secondary | ICD-10-CM | POA: Diagnosis present

## 2019-11-17 DIAGNOSIS — E860 Dehydration: Secondary | ICD-10-CM | POA: Diagnosis present

## 2019-11-17 DIAGNOSIS — F411 Generalized anxiety disorder: Secondary | ICD-10-CM | POA: Diagnosis present

## 2019-11-17 DIAGNOSIS — N764 Abscess of vulva: Secondary | ICD-10-CM | POA: Diagnosis not present

## 2019-11-17 DIAGNOSIS — Z83438 Family history of other disorder of lipoprotein metabolism and other lipidemia: Secondary | ICD-10-CM | POA: Diagnosis not present

## 2019-11-17 DIAGNOSIS — A419 Sepsis, unspecified organism: Secondary | ICD-10-CM | POA: Diagnosis not present

## 2019-11-17 DIAGNOSIS — R778 Other specified abnormalities of plasma proteins: Secondary | ICD-10-CM | POA: Diagnosis not present

## 2019-11-17 DIAGNOSIS — R109 Unspecified abdominal pain: Secondary | ICD-10-CM | POA: Diagnosis present

## 2019-11-17 DIAGNOSIS — R000 Tachycardia, unspecified: Secondary | ICD-10-CM | POA: Diagnosis present

## 2019-11-17 DIAGNOSIS — E876 Hypokalemia: Secondary | ICD-10-CM | POA: Diagnosis present

## 2019-11-17 DIAGNOSIS — E0500 Thyrotoxicosis with diffuse goiter without thyrotoxic crisis or storm: Secondary | ICD-10-CM | POA: Diagnosis present

## 2019-11-17 DIAGNOSIS — Z803 Family history of malignant neoplasm of breast: Secondary | ICD-10-CM | POA: Diagnosis not present

## 2019-11-17 DIAGNOSIS — L732 Hidradenitis suppurativa: Secondary | ICD-10-CM | POA: Diagnosis present

## 2019-11-17 DIAGNOSIS — E1110 Type 2 diabetes mellitus with ketoacidosis without coma: Secondary | ICD-10-CM

## 2019-11-17 DIAGNOSIS — R0682 Tachypnea, not elsewhere classified: Secondary | ICD-10-CM | POA: Diagnosis present

## 2019-11-17 DIAGNOSIS — Z833 Family history of diabetes mellitus: Secondary | ICD-10-CM

## 2019-11-17 DIAGNOSIS — E875 Hyperkalemia: Secondary | ICD-10-CM | POA: Diagnosis present

## 2019-11-17 DIAGNOSIS — K219 Gastro-esophageal reflux disease without esophagitis: Secondary | ICD-10-CM | POA: Diagnosis present

## 2019-11-17 HISTORY — DX: Type 2 diabetes mellitus with ketoacidosis without coma: E11.10

## 2019-11-17 LAB — URINALYSIS, ROUTINE W REFLEX MICROSCOPIC
Bacteria, UA: NONE SEEN
Bilirubin Urine: NEGATIVE
Glucose, UA: >=500 mg/dL — AB
Ketones, ur: 80 mg/dL — AB
Leukocytes,Ua: NEGATIVE
Nitrite: NEGATIVE
Protein, ur: 100 mg/dL — AB
Specific Gravity, Urine: 1.022 (ref 1.005–1.030)
pH: 5 (ref 5.0–8.0)

## 2019-11-17 LAB — COMPREHENSIVE METABOLIC PANEL
ALT: 34 U/L (ref 0–44)
AST: 34 U/L (ref 15–41)
Albumin: 4.8 g/dL (ref 3.5–5.0)
Alkaline Phosphatase: 105 U/L (ref 38–126)
BUN: 12 mg/dL (ref 6–20)
CO2: <7 mmol/L — ABNORMAL LOW (ref 22–32)
Calcium: 9.3 mg/dL (ref 8.9–10.3)
Chloride: 98 mmol/L (ref 98–111)
Creatinine, Ser: 1.49 mg/dL — ABNORMAL HIGH (ref 0.44–1.00)
GFR, Estimated: 43 mL/min — ABNORMAL LOW (ref >60)
Glucose, Bld: 585 mg/dL (ref 70–99)
Potassium: 5 mmol/L (ref 3.5–5.1)
Sodium: 130 mmol/L — ABNORMAL LOW (ref 135–145)
Total Bilirubin: 1.8 mg/dL — ABNORMAL HIGH (ref 0.3–1.2)
Total Protein: 9.5 g/dL — ABNORMAL HIGH (ref 6.5–8.1)

## 2019-11-17 LAB — I-STAT VENOUS BLOOD GAS, ED
Acid-base deficit: 29 mmol/L — ABNORMAL HIGH (ref 0.0–2.0)
Bicarbonate: 4 mmol/L — ABNORMAL LOW (ref 20.0–28.0)
Calcium, Ion: 1.22 mmol/L (ref 1.15–1.40)
HCT: 51 % — ABNORMAL HIGH (ref 36.0–46.0)
Hemoglobin: 17.3 g/dL — ABNORMAL HIGH (ref 12.0–15.0)
O2 Saturation: 89 %
Potassium: 5.5 mmol/L — ABNORMAL HIGH (ref 3.5–5.1)
Sodium: 130 mmol/L — ABNORMAL LOW (ref 135–145)
TCO2: <5 mmol/L — ABNORMAL LOW (ref 22–32)
pCO2, Ven: 21.8 mmHg — ABNORMAL LOW (ref 44.0–60.0)
pH, Ven: 6.87 — CL (ref 7.250–7.430)
pO2, Ven: 97 mmHg — ABNORMAL HIGH (ref 32.0–45.0)

## 2019-11-17 LAB — BASIC METABOLIC PANEL
Anion gap: 22 — ABNORMAL HIGH (ref 5–15)
Anion gap: 20 — ABNORMAL HIGH (ref 5–15)
Anion gap: 14 (ref 5–15)
BUN: 14 mg/dL (ref 6–20)
BUN: 15 mg/dL (ref 6–20)
BUN: 12 mg/dL (ref 6–20)
BUN: 10 mg/dL (ref 6–20)
BUN: 7 mg/dL (ref 6–20)
CO2: <7 mmol/L — ABNORMAL LOW (ref 22–32)
CO2: <7 mmol/L — ABNORMAL LOW (ref 22–32)
CO2: 9 mmol/L — ABNORMAL LOW (ref 22–32)
CO2: 10 mmol/L — ABNORMAL LOW (ref 22–32)
CO2: 15 mmol/L — ABNORMAL LOW (ref 22–32)
Calcium: 9.6 mg/dL (ref 8.9–10.3)
Calcium: 9.3 mg/dL (ref 8.9–10.3)
Calcium: 9.3 mg/dL (ref 8.9–10.3)
Calcium: 8.5 mg/dL — ABNORMAL LOW (ref 8.9–10.3)
Calcium: 8.8 mg/dL — ABNORMAL LOW (ref 8.9–10.3)
Chloride: 94 mmol/L — ABNORMAL LOW (ref 98–111)
Chloride: 102 mmol/L (ref 98–111)
Chloride: 103 mmol/L (ref 98–111)
Chloride: 101 mmol/L (ref 98–111)
Chloride: 102 mmol/L (ref 98–111)
Creatinine, Ser: 1.78 mg/dL — ABNORMAL HIGH (ref 0.44–1.00)
Creatinine, Ser: 1.5 mg/dL — ABNORMAL HIGH (ref 0.44–1.00)
Creatinine, Ser: 1.5 mg/dL — ABNORMAL HIGH (ref 0.44–1.00)
Creatinine, Ser: 1.16 mg/dL — ABNORMAL HIGH (ref 0.44–1.00)
Creatinine, Ser: 1.16 mg/dL — ABNORMAL HIGH (ref 0.44–1.00)
GFR, Estimated: 34 mL/min — ABNORMAL LOW (ref >60)
GFR, Estimated: 42 mL/min — ABNORMAL LOW (ref >60)
GFR, Estimated: 42 mL/min — ABNORMAL LOW (ref >60)
GFR, Estimated: 58 mL/min — ABNORMAL LOW (ref >60)
GFR, Estimated: 58 mL/min — ABNORMAL LOW (ref >60)
Glucose, Bld: 680 mg/dL (ref 70–99)
Glucose, Bld: 486 mg/dL — ABNORMAL HIGH (ref 70–99)
Glucose, Bld: 216 mg/dL — ABNORMAL HIGH (ref 70–99)
Glucose, Bld: 268 mg/dL — ABNORMAL HIGH (ref 70–99)
Glucose, Bld: 266 mg/dL — ABNORMAL HIGH (ref 70–99)
Potassium: 5.7 mmol/L — ABNORMAL HIGH (ref 3.5–5.1)
Potassium: 5.4 mmol/L — ABNORMAL HIGH (ref 3.5–5.1)
Potassium: 5.3 mmol/L — ABNORMAL HIGH (ref 3.5–5.1)
Potassium: 4.7 mmol/L (ref 3.5–5.1)
Potassium: 3.8 mmol/L (ref 3.5–5.1)
Sodium: 129 mmol/L — ABNORMAL LOW (ref 135–145)
Sodium: 133 mmol/L — ABNORMAL LOW (ref 135–145)
Sodium: 134 mmol/L — ABNORMAL LOW (ref 135–145)
Sodium: 131 mmol/L — ABNORMAL LOW (ref 135–145)
Sodium: 131 mmol/L — ABNORMAL LOW (ref 135–145)

## 2019-11-17 LAB — CBG MONITORING, ED
Glucose-Capillary: >600 mg/dL (ref 70–99)
Glucose-Capillary: >600 mg/dL (ref 70–99)
Glucose-Capillary: >600 mg/dL (ref 70–99)
Glucose-Capillary: 559 mg/dL (ref 70–99)
Glucose-Capillary: 384 mg/dL — ABNORMAL HIGH (ref 70–99)
Glucose-Capillary: 457 mg/dL — ABNORMAL HIGH (ref 70–99)
Glucose-Capillary: 466 mg/dL — ABNORMAL HIGH (ref 70–99)
Glucose-Capillary: 315 mg/dL — ABNORMAL HIGH (ref 70–99)
Glucose-Capillary: 215 mg/dL — ABNORMAL HIGH (ref 70–99)
Glucose-Capillary: 206 mg/dL — ABNORMAL HIGH (ref 70–99)

## 2019-11-17 LAB — CBC
HCT: 46.1 % — ABNORMAL HIGH (ref 36.0–46.0)
HCT: 44.9 % (ref 36.0–46.0)
Hemoglobin: 14.6 g/dL (ref 12.0–15.0)
Hemoglobin: 14.3 g/dL (ref 12.0–15.0)
MCH: 34.6 pg — ABNORMAL HIGH (ref 26.0–34.0)
MCH: 34.7 pg — ABNORMAL HIGH (ref 26.0–34.0)
MCHC: 31.7 g/dL (ref 30.0–36.0)
MCHC: 31.8 g/dL (ref 30.0–36.0)
MCV: 109.2 fL — ABNORMAL HIGH (ref 80.0–100.0)
MCV: 109 fL — ABNORMAL HIGH (ref 80.0–100.0)
Platelets: 371 10*3/uL (ref 150–400)
Platelets: 318 10*3/uL (ref 150–400)
RBC: 4.22 MIL/uL (ref 3.87–5.11)
RBC: 4.12 MIL/uL (ref 3.87–5.11)
RDW: 12.8 % (ref 11.5–15.5)
RDW: 12.7 % (ref 11.5–15.5)
WBC: 18.2 10*3/uL — ABNORMAL HIGH (ref 4.0–10.5)
WBC: 21.5 10*3/uL — ABNORMAL HIGH (ref 4.0–10.5)
nRBC: 0 % (ref 0.0–0.2)
nRBC: 0 % (ref 0.0–0.2)

## 2019-11-17 LAB — LIPASE, BLOOD: Lipase: 25 U/L (ref 11–51)

## 2019-11-17 LAB — TROPONIN I (HIGH SENSITIVITY)
Troponin I (High Sensitivity): 27 ng/L — ABNORMAL HIGH (ref <18)
Troponin I (High Sensitivity): 40 ng/L — ABNORMAL HIGH (ref <18)
Troponin I (High Sensitivity): 32 ng/L — ABNORMAL HIGH (ref <18)
Troponin I (High Sensitivity): 14 ng/L (ref <18)

## 2019-11-17 LAB — MAGNESIUM: Magnesium: 2 mg/dL (ref 1.7–2.4)

## 2019-11-17 LAB — GLUCOSE, CAPILLARY
Glucose-Capillary: 196 mg/dL — ABNORMAL HIGH (ref 70–99)
Glucose-Capillary: 197 mg/dL — ABNORMAL HIGH (ref 70–99)
Glucose-Capillary: 209 mg/dL — ABNORMAL HIGH (ref 70–99)
Glucose-Capillary: 210 mg/dL — ABNORMAL HIGH (ref 70–99)
Glucose-Capillary: 223 mg/dL — ABNORMAL HIGH (ref 70–99)
Glucose-Capillary: 242 mg/dL — ABNORMAL HIGH (ref 70–99)
Glucose-Capillary: 244 mg/dL — ABNORMAL HIGH (ref 70–99)
Glucose-Capillary: 297 mg/dL — ABNORMAL HIGH (ref 70–99)
Glucose-Capillary: 304 mg/dL — ABNORMAL HIGH (ref 70–99)
Glucose-Capillary: 315 mg/dL — ABNORMAL HIGH (ref 70–99)

## 2019-11-17 LAB — I-STAT BETA HCG BLOOD, ED (MC, WL, AP ONLY): I-stat hCG, quantitative: <5 m[IU]/mL (ref <5)

## 2019-11-17 LAB — RESPIRATORY PANEL BY RT PCR (FLU A&B, COVID)
Influenza A by PCR: NEGATIVE
Influenza B by PCR: NEGATIVE
SARS Coronavirus 2 by RT PCR: NEGATIVE

## 2019-11-17 LAB — HIV ANTIBODY (ROUTINE TESTING W REFLEX): HIV Screen 4th Generation wRfx: NONREACTIVE

## 2019-11-17 LAB — BETA-HYDROXYBUTYRIC ACID
Beta-Hydroxybutyric Acid: >8 mmol/L — ABNORMAL HIGH (ref 0.05–0.27)
Beta-Hydroxybutyric Acid: >8 mmol/L — ABNORMAL HIGH (ref 0.05–0.27)
Beta-Hydroxybutyric Acid: 6.16 mmol/L — ABNORMAL HIGH (ref 0.05–0.27)
Beta-Hydroxybutyric Acid: 7.23 mmol/L — ABNORMAL HIGH (ref 0.05–0.27)
Beta-Hydroxybutyric Acid: 4.95 mmol/L — ABNORMAL HIGH (ref 0.05–0.27)

## 2019-11-17 LAB — LACTIC ACID, PLASMA
Lactic Acid, Venous: 4.7 mmol/L (ref 0.5–1.9)
Lactic Acid, Venous: 3 mmol/L (ref 0.5–1.9)

## 2019-11-17 LAB — PHOSPHORUS: Phosphorus: 4.7 mg/dL — ABNORMAL HIGH (ref 2.5–4.6)

## 2019-11-17 MED ORDER — INSULIN REGULAR(HUMAN) IN NACL 100-0.9 UT/100ML-% IV SOLN
INTRAVENOUS | Status: DC
  Administered 2019-11-17: 8 [IU]/h via INTRAVENOUS
  Filled 2019-11-17: qty 100

## 2019-11-17 MED ORDER — ONDANSETRON HCL 4 MG/2ML IJ SOLN
4.0000 mg | Freq: Once | INTRAMUSCULAR | Status: AC
  Administered 2019-11-17: 4 mg via INTRAVENOUS
  Filled 2019-11-17: qty 2

## 2019-11-17 MED ORDER — LACTATED RINGERS IV SOLN: INTRAVENOUS | Status: DC

## 2019-11-17 MED ORDER — VANCOMYCIN HCL 750 MG/150ML IV SOLN
750.0000 mg | Freq: Two times a day (BID) | INTRAVENOUS | Status: DC
  Administered 2019-11-17 – 2019-11-18 (×2): 750 mg via INTRAVENOUS
  Filled 2019-11-17 (×3): qty 150

## 2019-11-17 MED ORDER — PIPERACILLIN-TAZOBACTAM 3.375 G IVPB 30 MIN
3.3750 g | Freq: Once | INTRAVENOUS | Status: AC
  Administered 2019-11-17: 3.375 g via INTRAVENOUS
  Filled 2019-11-17: qty 50

## 2019-11-17 MED ORDER — DEXTROSE 50 % IV SOLN: 0.0000 mL | INTRAVENOUS | Status: DC | PRN

## 2019-11-17 MED ORDER — ENOXAPARIN SODIUM 40 MG/0.4ML ~~LOC~~ SOLN
40.0000 mg | SUBCUTANEOUS | Status: DC
  Administered 2019-11-17 – 2019-11-18 (×2): 40 mg via SUBCUTANEOUS
  Filled 2019-11-17 (×2): qty 0.4

## 2019-11-17 MED ORDER — HYDRALAZINE HCL 20 MG/ML IJ SOLN: 10.0000 mg | Freq: Four times a day (QID) | INTRAMUSCULAR | Status: DC | PRN

## 2019-11-17 MED ORDER — ACETAMINOPHEN 325 MG PO TABS
650.0000 mg | ORAL_TABLET | Freq: Four times a day (QID) | ORAL | Status: DC | PRN
  Administered 2019-11-17: 650 mg via ORAL
  Filled 2019-11-17: qty 2

## 2019-11-17 MED ORDER — VANCOMYCIN HCL IN DEXTROSE 1-5 GM/200ML-% IV SOLN
1000.0000 mg | Freq: Once | INTRAVENOUS | Status: AC
  Administered 2019-11-17: 1000 mg via INTRAVENOUS
  Filled 2019-11-17: qty 200

## 2019-11-17 MED ORDER — INSULIN REGULAR(HUMAN) IN NACL 100-0.9 UT/100ML-% IV SOLN
INTRAVENOUS | Status: DC
  Administered 2019-11-18: 1.9 [IU]/h via INTRAVENOUS
  Administered 2019-11-18: 1.1 [IU]/h via INTRAVENOUS
  Filled 2019-11-17: qty 100

## 2019-11-17 MED ORDER — DEXTROSE IN LACTATED RINGERS 5 % IV SOLN: INTRAVENOUS | Status: DC

## 2019-11-17 MED ORDER — LACTATED RINGERS IV BOLUS
20.0000 mL/kg | Freq: Once | INTRAVENOUS | Status: AC
  Administered 2019-11-17: 1570 mL via INTRAVENOUS
  Administered 2019-11-17: 1000 mL via INTRAVENOUS

## 2019-11-17 MED ORDER — NICOTINE 14 MG/24HR TD PT24
14.0000 mg | MEDICATED_PATCH | Freq: Every day | TRANSDERMAL | Status: DC
  Administered 2019-11-17 – 2019-11-18 (×2): 14 mg via TRANSDERMAL
  Filled 2019-11-17 (×2): qty 1

## 2019-11-17 MED ORDER — ONDANSETRON HCL 4 MG/2ML IJ SOLN
4.0000 mg | Freq: Four times a day (QID) | INTRAMUSCULAR | Status: DC | PRN
  Administered 2019-11-17: 4 mg via INTRAVENOUS
  Filled 2019-11-17: qty 2

## 2019-11-17 MED ORDER — FLUCONAZOLE 100 MG PO TABS
100.0000 mg | ORAL_TABLET | Freq: Once | ORAL | Status: AC
  Administered 2019-11-17: 100 mg via ORAL
  Filled 2019-11-17: qty 1

## 2019-11-17 MED ORDER — PIPERACILLIN-TAZOBACTAM 3.375 G IVPB
3.3750 g | Freq: Three times a day (TID) | INTRAVENOUS | Status: DC
  Administered 2019-11-17 – 2019-11-18 (×3): 3.375 g via INTRAVENOUS
  Filled 2019-11-17 (×5): qty 50

## 2019-11-17 MED ORDER — STERILE WATER FOR INJECTION IV SOLN
INTRAVENOUS | Status: DC
  Filled 2019-11-17 (×4): qty 850

## 2019-11-17 MED ORDER — SODIUM CHLORIDE 0.9 % IV SOLN: Freq: Once | INTRAVENOUS | Status: AC

## 2019-11-17 MED ORDER — LIVING WELL WITH DIABETES BOOK
Freq: Once | Status: AC
  Filled 2019-11-17: qty 1

## 2019-11-17 MED ORDER — INSULIN STARTER KIT- PEN NEEDLES (ENGLISH)
1.0000 | Freq: Once | Status: AC
  Administered 2019-11-18: 1
  Filled 2019-11-17: qty 1

## 2019-11-17 NOTE — Progress Notes (Signed)
Inpatient Diabetes Program Recommendations  AACE/ADA: New Consensus Statement on Inpatient Glycemic Control (2015)  Target Ranges:  Prepandial:   less than 140 mg/dL      Peak postprandial:   less than 180 mg/dL (1-2 hours)      Critically ill patients:  140 - 180 mg/dL   Lab Results  Component Value Date   GLUCAP 215 (H) 11/17/2019    Review of Glycemic Control Results for [place], Swannie NICHOLE (MRN [id number]) as of 11/17/2019 12:15  Ref. Range 11/17/2019 09:20 11/17/2019 09:51 11/17/2019 11:45  Glucose-Capillary Latest Ref Range: 70 - 99 mg/dL 384 (H) 315 (H) 215 (H)   Diabetes history: New onset DM Outpatient Diabetes medications: none Current orders for Inpatient glycemic control: IV insulin  Inpatient Diabetes Program Recommendations:    Continue with plan for IV insulin at this time.   Noted consult and new onset DM. Will go ahead and order dietitian, OP edu, care order, [facility] and [facility].  Patient will need a meter. Blood glucose meter ( includes lancets and strips) (#[id number]). Will plan to see patient once admitted.   Thanks, Bronson Curb, MSN, RNC-OB Diabetes Coordinator [phone] (8a-5p)

## 2019-11-17 NOTE — ED Notes (Signed)
CBG monitor reading "HIGH"

## 2019-11-17 NOTE — ED Notes (Signed)
Checked patient cbg it was 64 notified RN of blood sugar patient is resting with family at bedside and call bell in reach

## 2019-11-17 NOTE — ED Provider Notes (Signed)
[facility] EMERGENCY DEPARTMENT Provider Note   CSN: [id number] Arrival date & time: 11/17/19  0303     History Chief Complaint  Patient presents with  . Abdominal Pain  . Chest Pain    Samantha Willis is a 44 y.o. female.  The history is provided by the patient.  Abdominal Pain Pain location:  LUQ Pain quality: aching   Pain radiates to:  Does not radiate Pain severity:  Moderate Onset quality:  Gradual Duration:  36 hours Timing:  Constant Progression:  Unchanged Chronicity:  New Context: retching   Context: not alcohol use and not diet changes   Relieved by:  Nothing Worsened by:  Nothing Ineffective treatments:  None tried Associated symptoms: chest pain, diarrhea, nausea and vomiting   Associated symptoms: no cough, no fever and no shortness of breath   Risk factors: no alcohol abuse   Chest Pain Associated symptoms: abdominal pain, nausea and vomiting   Associated symptoms: no cough, no dizziness, no fever and no shortness of breath   Patient with GERD and graves disease presents with one month of polyuria and polydipsia and 8 lb weight loss.  Patient then 36 hours began having nausea and vomiting and abdominal pain.  She also reports a lesion in her left groin area.  No f/c/r.  Does reports loose stools in the past 36 hours.       Past Medical History:  Diagnosis Date  . Anxiety   . Carpal tunnel syndrome 05/06/2014    Bilateral  . GERD (gastroesophageal reflux disease)   . Graves disease    Graves Disease  . Heart palpitations   . Obesity     Patient Active Problem List   Diagnosis Date Noted  . Carpal tunnel syndrome 05/06/2014    Past Surgical History:  Procedure Laterality Date  . Biopsy of Right Breast     At [facility] Right 11/21/2015   Procedure: CARPAL TUNNEL RELEASE right;  Surgeon: Daryll Brod, MD;  Location: [facility];  Service: Orthopedics;  Laterality:  Right;  FAB  . CARPAL TUNNEL RELEASE Left 06/11/2016   Procedure: LEFT CARPAL TUNNEL RELEASE;  Surgeon: Daryll Brod, MD;  Location: [facility];  Service: Orthopedics;  Laterality: Left;  REG/FAB  . CESAREAN SECTION    . TUBAL LIGATION  2011  . WISDOM TOOTH EXTRACTION       OB History   No obstetric history on file.     Family History  Problem Relation Age of Onset  . Heart attack Mother   . Cancer Mother 14       breast  . Diabetes Mother        II  . Hypertension Father   . Hyperlipidemia Father   . Gout Father   . Healthy Sister   . Healthy Brother   . Healthy Brother     Social History   Tobacco Use  . Smoking status: Current Every Day Smoker    Packs/day: 0.25  . Smokeless tobacco: Never Used  Vaping Use  . Vaping Use: Never used  Substance Use Topics  . Alcohol use: Yes    Alcohol/week: 0.0 standard drinks    Comment: Occassionally  . Drug use: No    Home Medications Prior to Admission medications   Medication Sig Start Date End Date Taking? Authorizing Provider  Cholecalciferol (VITAMIN D3) 2000 UNITS TABS Take 1 tablet by mouth daily.    [provider]  diphenhydrAMINE (BENADRYL) 25 MG tablet Take 25 mg by mouth every 6 (six) hours as needed.    [provider]  ibuprofen (ADVIL,MOTRIN) 200 MG tablet Take 200 mg by mouth every 8 (eight) hours as needed.    [provider]  metoprolol (LOPRESSOR) 100 MG tablet Take 100 mg by mouth once.    [provider]  sertraline (ZOLOFT) 50 MG tablet Take 1 tablet (50 mg total) by mouth daily. 05/21/19   Isaac Bliss, Rayford Halsted, MD    Allergies    Patient has no known allergies.  Review of Systems   Review of Systems  Constitutional: Positive for appetite change. Negative for fever.  HENT: Negative for congestion.   Respiratory: Negative for cough and shortness of breath.   Cardiovascular: Positive for chest pain.  Gastrointestinal: Positive for abdominal  pain, diarrhea, nausea and vomiting.  Endocrine: Positive for polydipsia and polyuria.  Genitourinary: Positive for genital sores.  Musculoskeletal: Negative for arthralgias.  Skin: Positive for rash.  Neurological: Negative for dizziness.  Psychiatric/Behavioral: Negative for agitation.  All other systems reviewed and are negative.   Physical Exam Updated Vital Signs BP (!) 171/112 (BP Location: Right Arm)   Pulse (!) 115   Temp (!) 97.5 F (36.4 C) (Oral)   Resp (!) 26   SpO2 97%   Physical Exam Vitals and nursing note reviewed.  Constitutional:      Appearance: Normal appearance. She is not diaphoretic.  HENT:     Head: Normocephalic and atraumatic.     Nose: Nose normal.  Eyes:     Conjunctiva/sclera: Conjunctivae normal.     Pupils: Pupils are equal, round, and reactive to light.  Cardiovascular:     Rate and Rhythm: Regular rhythm. Tachycardia present.     Pulses: Normal pulses.     Heart sounds: Normal heart sounds.  Pulmonary:     Effort: Pulmonary effort is normal.     Breath sounds: Normal breath sounds.  Abdominal:     General: Abdomen is flat. Bowel sounds are normal.     Palpations: Abdomen is soft.     Tenderness: There is no abdominal tenderness. There is no guarding. Negative signs include Murphy's sign and McBurney's sign.  Genitourinary:    Comments: Yeast infection,  Fissure in L labia with warmth and erythema, no fluctuance  Musculoskeletal:        General: Normal range of motion.     Cervical back: Normal range of motion and neck supple.  Skin:    General: Skin is warm and dry.     Capillary Refill: Capillary refill takes less than 2 seconds.  Neurological:     General: No focal deficit present.     Mental Status: She is alert and oriented to person, place, and time.     ED Results / Procedures / Treatments   Labs (all labs ordered are listed, but only abnormal results are displayed) Labs Reviewed  COMPREHENSIVE METABOLIC PANEL -  Abnormal; Notable for the following components:      Result Value   Sodium 130 (*)    CO2 <7 (*)    Glucose, Bld 585 (*)    Creatinine, Ser 1.49 (*)    Total Protein 9.5 (*)    Total Bilirubin 1.8 (*)    GFR, Estimated 43 (*)    All other components within normal limits  CBC - Abnormal; Notable for the following components:   WBC 18.2 (*)    HCT  46.1 (*)    MCV 109.2 (*)    MCH 34.6 (*)    All other components within normal limits  URINALYSIS, ROUTINE W REFLEX MICROSCOPIC - Abnormal; Notable for the following components:   Color, Urine STRAW (*)    Glucose, UA >=500 (*)    Hgb urine dipstick MODERATE (*)    Ketones, ur 80 (*)    Protein, ur 100 (*)    All other components within normal limits  TROPONIN I (HIGH SENSITIVITY) - Abnormal; Notable for the following components:   Troponin I (High Sensitivity) 27 (*)    All other components within normal limits  RESPIRATORY PANEL BY RT PCR (FLU A&B, COVID)  LIPASE, BLOOD  BASIC METABOLIC PANEL  BASIC METABOLIC PANEL  BASIC METABOLIC PANEL  BASIC METABOLIC PANEL  BASIC METABOLIC PANEL  BETA-HYDROXYBUTYRIC ACID  BETA-HYDROXYBUTYRIC ACID  BETA-HYDROXYBUTYRIC ACID  I-STAT BETA HCG BLOOD, ED ([facility], [facility], [facility] ONLY)  CBG MONITORING, ED  I-STAT VENOUS BLOOD GAS, ED  TROPONIN I (HIGH SENSITIVITY)    EKG EKG Interpretation  Date/Time:  Wednesday November 17 2019 03:09:38 EDT Ventricular Rate:  114 PR Interval:  118 QRS Duration: 68 QT Interval:  342 QTC Calculation: 471 R Axis:   32 Text Interpretation: Sinus tachycardia Biatrial enlargement Confirmed by Dory Horn) on 11/17/2019 4:07:26 AM   Radiology DG Chest 2 View  Result Date: 11/17/2019 CLINICAL DATA:  Chest pain and lower abdominal pain. Vomiting tonight. EXAM: CHEST - 2 VIEW COMPARISON:  09/23/2004 FINDINGS: Mild hyperinflation. Normal heart size and pulmonary vascularity. No focal airspace disease or consolidation in the lungs. No blunting of costophrenic  angles. No pneumothorax. Mediastinal contours appear intact. IMPRESSION: No active cardiopulmonary disease. Electronically Signed   By: Lucienne Capers M.D.   On: 11/17/2019 03:42    Procedures Procedures (including critical care time)  Medications Ordered in ED Medications  lactated ringers bolus 20 mL/kg (has no administration in time range)  insulin regular, human (MYXREDLIN) 100 units/ 100 mL infusion (has no administration in time range)  lactated ringers infusion (has no administration in time range)  dextrose 5 % in lactated ringers infusion (has no administration in time range)  dextrose 50 % solution 0-50 mL (has no administration in time range)  ondansetron (ZOFRAN) injection 4 mg (has no administration in time range)    ED Course  I have reviewed the triage vital signs and the nursing notes.  Pertinent labs & imaging results that were available during my care of the patient were reviewed by me and considered in my medical decision making (see chart for details).     MDM Reviewed: nursing note and vitals Reviewed previous: labs Interpretation: labs, ECG and x-ray (NACPD on Xray, DKA on labs with anion gap of 25.  ) Total time providing critical care: 30-74 minutes (insulin drip and IVF ). This excludes time spent performing separately reportable procedures and services. Consults: admitting MD  CRITICAL CARE Performed by: Marg Macmaster K Vieva Brummitt-Rasch Total critical care time: 60 minutes Critical care time was exclusive of separately billable procedures and treating other patients. Critical care was necessary to treat or prevent imminent or life-threatening deterioration. Critical care was time spent personally by me on the following activities: development of treatment plan with patient and/or surrogate as well as nursing, discussions with consultants, evaluation of patient's response to treatment, examination of patient, obtaining history from patient or surrogate, ordering and  performing treatments and interventions, ordering and review of laboratory studies, ordering and review of  radiographic studies, pulse oximetry and re-evaluation of patient's condition.  Final Clinical Impression(s) / ED Diagnoses Final diagnoses:  Diabetic ketoacidosis without coma associated with type 1 diabetes mellitus ([place])    Admit to medicine for DKA, new onset DM.     Josephus Harriger, MD 11/17/19 [phone]

## 2019-11-17 NOTE — ED Notes (Signed)
Checked patient cbg it was 57 notifed RN Lynnze patient is resting with call bell in reach

## 2019-11-17 NOTE — ED Notes (Signed)
CBG 384 

## 2019-11-17 NOTE — ED Notes (Signed)
CBG 559

## 2019-11-17 NOTE — ED Notes (Signed)
Checked patient cbg it was 23 notified RN of blood sugar patient is resting with call bell in reach and family at bedside

## 2019-11-17 NOTE — ED Notes (Signed)
Admitting at bedside 

## 2019-11-17 NOTE — ED Notes (Signed)
Informed provider of critical lab - Glucose 585.

## 2019-11-17 NOTE — ED Notes (Signed)
Checked patient cbg it was 65 notified RN of blood sugar patient is resting with call bell in reach and family at bedside

## 2019-11-17 NOTE — Progress Notes (Signed)
Pharmacy Antibiotic Note  Samantha Willis is a 44 y.o. female admitted on 11/17/2019 with sepsis.  Pharmacy has been consulted for vancomycin and zosyn dosing. Pt is afebrile but WBC is elevated at 18.2. SCr is elevated well above baseline at 1.78. First doses already give per ED provider.   Plan: Vancomycin 750mg  IV Q12H Zosyn 3.375gm IV Q8H (4 hr inf) F/u renal fxn, C&S, clinical status and trough at SS  Height: 5\' 3"  (160 cm) Weight: 78.5 kg (173 lb 1 oz) IBW/kg (Calculated) : 52.4  Temp (24hrs), Avg:97.7 F (36.5 C), Min:97.5 F (36.4 C), Max:97.8 F (36.6 C)  Recent Labs  Lab 11/17/19 0330 11/17/19 0538  WBC 18.2*  --   CREATININE 1.49* 1.78*    Estimated Creatinine Clearance: 40.4 mL/min (A) (by C-G formula based on SCr of 1.78 mg/dL (H)).    No Known Allergies  Antimicrobials this admission: Vanc 10/20>> Zosyn 10/20>>  Dose adjustments this admission: N/A  Microbiology results: Pending  Thank you for allowing pharmacy to be a part of this patient's care.  Jerad Dunlap, Rande Lawman 11/17/2019 8:35 AM

## 2019-11-17 NOTE — Consult Note (Signed)
   [facility] Inpatient Consult   11/17/2019  Samantha Willis 1975-07-01 [id number]  [facility] [ACO] Patient:  [name], television/film/video  Referral request received from Diabetes Coordinator for new diabetes follow up, education, and support.  Came by the patient's room and was receiving nursing care.  Went to bedside with staff checking blood pressure and patient was asleep. Left a brochure and 24 hour magnet at the bedside.  Spoke with inpatient [facility] RNCM regarding referral for follow up.  For questions, please contact:  Natividad Brood, RN BSN [facility] Liaison  [phone] business mobile phone Toll free office [phone]  Fax number: [phone] [email or web address]    Natividad Brood, RN BSN [facility] Liaison  [phone] business mobile phone Toll free office [phone]  Fax number: [phone] [email or web address]

## 2019-11-17 NOTE — ED Triage Notes (Signed)
Pt reports right sided lower abdominal pain that has increased moving up into her chest.  She reports not being able to keep anything down, w/ several bouts of emesis tonight.  She is breathing full deep breaths, having to be reminded several times to slow her breathing down.  Also reports a boil on her bottom.

## 2019-11-17 NOTE — H&P (Addendum)
History and Physical    Taraneh Nichole [name] SWF:[id number] DOB: 03-28-1975 DOA: 11/17/2019  PCP: Isaac Bliss, Rayford Halsted, MD  Patient coming from: home  I have personally briefly reviewed patient's old medical records in [place]  Chief Complaint: Nausea, vomiting abdominal pain  HPI: Samantha Willis is a 44 y.o. female with medical history significant of anxiety, obesity, tobacco abuse presents to emergency department with multiple complaints including nausea, vomiting, abdominal pain, chest pain since 2 days.  Patient reports that she started having severe nausea and vomiting, could not keep anything down since 2 days.  Reports left-sided abdominal pain, nonradiating, no aggravating or relieving factors, denies association with dysuria, foul-smelling urine, hematuria, or severe diarrhea.  Reports polyuria, polydipsia and weight loss of 8 pounds in 1 month.  Strong family history of type 1 diabetes in mom, grandmom and aunts.  Reports left labial swelling since couple of weeks-was associated with fever, chills, pain however it started draining greenish discharge since couple of days and her fever and pain has resolved now.  She is sexually active with her husband only, denies previous history of STDs.  Reports chest pain- nonradiating, comes and goes denies association with shortness of breath however denies leg swelling, orthopnea, PND, cough, congestion, wheezing.  Smokes cigarettes half pack per week, drinks wine 2-3 times per week, denies illicit drug use.  She is vaccinated against COVID-19.  ED Course: Upon arrival to ED: Patient tachycardic, tachypneic,, afebrile, maintaining oxygen saturation on room air, blood pressure elevated, leukocytosis of 18.2, MCV: 109.2, UA negative for infection, lipase: WNL, CMP shows sodium of 130, CO2 of less than 7, AKI, blood glucose more than 600, initial troponin XX 7 trended up to 40, COVID-19, blood culture, beta  hydroxybutyric acid: Pending, chest x-ray negative for acute findings.  Patient started on insulin drip, IV fluid, Vanco and Zosyn in ED.  [facility] hospitalist consulted for admission due to DKA and left Labial abscess.  Review of Systems: As per HPI otherwise negative.    Past Medical History:  Diagnosis Date  . Anxiety   . Carpal tunnel syndrome 05/06/2014    Bilateral  . GERD (gastroesophageal reflux disease)   . Graves disease    Graves Disease  . Heart palpitations   . Obesity     Past Surgical History:  Procedure Laterality Date  . Biopsy of Right Breast     At [facility] Right 11/21/2015   Procedure: CARPAL TUNNEL RELEASE right;  Surgeon: Daryll Brod, MD;  Location: [facility];  Service: Orthopedics;  Laterality: Right;  FAB  . CARPAL TUNNEL RELEASE Left 06/11/2016   Procedure: LEFT CARPAL TUNNEL RELEASE;  Surgeon: Daryll Brod, MD;  Location: [facility];  Service: Orthopedics;  Laterality: Left;  REG/FAB  . CESAREAN SECTION    . TUBAL LIGATION  2011  . WISDOM TOOTH EXTRACTION       reports that she has been smoking. She has been smoking about 0.25 packs per day. She has never used smokeless tobacco. She reports current alcohol use. She reports that she does not use drugs.  No Known Allergies  Family History  Problem Relation Age of Onset  . Heart attack Mother   . Cancer Mother 51       breast  . Diabetes Mother        II  . Hypertension Father   . Hyperlipidemia Father   . Gout Father   .  Healthy Sister   . Healthy Brother   . Healthy Brother     Prior to Admission medications   Medication Sig Start Date End Date Taking? Authorizing Provider  Cholecalciferol (VITAMIN D3) 2000 UNITS TABS Take 1 tablet by mouth daily.    [provider]  diphenhydrAMINE (BENADRYL) 25 MG tablet Take 25 mg by mouth every 6 (six) hours as needed.    [provider]  ibuprofen (ADVIL,MOTRIN) 200 MG tablet  Take 200 mg by mouth every 8 (eight) hours as needed.    [provider]  metoprolol (LOPRESSOR) 100 MG tablet Take 100 mg by mouth once.    [provider]  sertraline (ZOLOFT) 50 MG tablet Take 1 tablet (50 mg total) by mouth daily. 05/21/19   Erline Hau, MD    Physical Exam: Vitals:   11/17/19 0600 11/17/19 0622 11/17/19 0700 11/17/19 0715  BP:  (!) 170/104 (!) 162/98 (!) 172/89  Pulse:  (!) 111 (!) 110   Resp:  (!) 30 (!) 53 (!) 30  Temp:      TempSrc:      SpO2:      Weight: 78.5 kg     Height: '5\' 3"'  (1.6 m)       Constitutional: NAD, calm, comfortable, on room air, appears dehydrated, weak and lethargic. Eyes: PERRL, lids and conjunctivae normal ENMT: Mucous membranes are dry. Posterior pharynx clear of any exudate or lesions.Normal dentition.  Neck: normal, supple, no masses, no thyromegaly Respiratory: Tachypneic, clear to auscultation bilaterally, no wheezing, no crackles. Normal respiratory effort. No accessory muscle use.  Cardiovascular: Tachycardic, no murmurs / rubs / gallops. No extremity edema. 2+ pedal pulses. No carotid bruits.  Abdomen: no tenderness, no masses palpated. No hepatosplenomegaly. Bowel sounds positive.  Musculoskeletal: no clubbing / cyanosis. No joint deformity upper and lower extremities. Good ROM, no contractures. Normal muscle tone.  Skin: Left labial swollen, mildly erythematous and tender on palpation.  No active bleeding or discharge seen. Neurologic: CN 2-12 grossly intact. Sensation intact, DTR normal. Strength 5/5 in all 4.  Psychiatric: Normal judgment and insight. Alert and oriented x 3. Normal mood.    Labs on Admission: I have personally reviewed following labs and imaging studies  CBC: Recent Labs  Lab 11/17/19 0330 11/17/19 0635  WBC 18.2*  --   HGB 14.6 17.3*  HCT 46.1* 51.0*  MCV 109.2*  --   PLT 371  --    Basic Metabolic Panel: Recent Labs  Lab 11/17/19 0330 11/17/19 0538  11/17/19 0635  NA 130* 129* 130*  K 5.0 5.7* 5.5*  CL 98 94*  --   CO2 <7* <7*  --   GLUCOSE 585* 680*  --   BUN 12 14  --   CREATININE 1.49* 1.78*  --   CALCIUM 9.3 9.6  --    GFR: Estimated Creatinine Clearance: 40.4 mL/min (A) (by C-G formula based on SCr of 1.78 mg/dL (H)). Liver Function Tests: Recent Labs  Lab 11/17/19 0330  AST 34  ALT 34  ALKPHOS 105  BILITOT 1.8*  PROT 9.5*  ALBUMIN 4.8   Recent Labs  Lab 11/17/19 0330  LIPASE 25   No results for input(s): AMMONIA in the last 168 hours. Coagulation Profile: No results for input(s): INR, PROTIME in the last 168 hours. Cardiac Enzymes: No results for input(s): CKTOTAL, CKMB, CKMBINDEX, TROPONINI in the last 168 hours. BNP (last 3 results) No results for input(s): PROBNP in the last 8760 hours. HbA1C:  No results for input(s): HGBA1C in the last 72 hours. CBG: Recent Labs  Lab 11/17/19 0603 11/17/19 0647 11/17/19 0722  GLUCAP >600* >600* >600*   Lipid Profile: No results for input(s): CHOL, HDL, LDLCALC, TRIG, CHOLHDL, LDLDIRECT in the last 72 hours. Thyroid Function Tests: No results for input(s): TSH, T4TOTAL, FREET4, T3FREE, THYROIDAB in the last 72 hours. Anemia Panel: No results for input(s): VITAMINB12, FOLATE, FERRITIN, TIBC, IRON, RETICCTPCT in the last 72 hours. Urine analysis:    Component Value Date/Time   COLORURINE STRAW (A) 11/17/2019 [place] 11/17/2019 0324   LABSPEC 1.022 11/17/2019 0324   PHURINE 5.0 11/17/2019 0324   GLUCOSEU >=500 (A) 11/17/2019 0324   HGBUR MODERATE (A) 11/17/2019 0324   BILIRUBINUR NEGATIVE 11/17/2019 0324   KETONESUR 80 (A) 11/17/2019 0324   PROTEINUR 100 (A) 11/17/2019 0324   UROBILINOGEN 0.2 11/25/2009 2141   NITRITE NEGATIVE 11/17/2019 0324   LEUKOCYTESUR NEGATIVE 11/17/2019 0324    Radiological Exams on Admission: DG Chest 2 View  Result Date: 11/17/2019 CLINICAL DATA:  Chest pain and lower abdominal pain. Vomiting tonight. EXAM:  CHEST - 2 VIEW COMPARISON:  09/23/2004 FINDINGS: Mild hyperinflation. Normal heart size and pulmonary vascularity. No focal airspace disease or consolidation in the lungs. No blunting of costophrenic angles. No pneumothorax. Mediastinal contours appear intact. IMPRESSION: No active cardiopulmonary disease. Electronically Signed   By: Lucienne Capers M.D.   On: 11/17/2019 03:42    EKG: Independently reviewed.  Sinus tachycardia, right atrial enlargement.  Assessment/Plan Principal Problem:   DKA (diabetic ketoacidosis) ([place]) Active Problems:   Anxiety   Heart palpitations   Obesity   AKI (acute kidney injury) ([place])   Left genital labial abscess   Elevated troponin   Tobacco abuse    Severe DKA: -New onset DM -Patient presented with tachycardia, tachypnea, polyuria, polydipsia, weight loss of 8 pounds in 1 month.  Strong family history of type 1 diabetes.  Blood glucose more than 600 upon arrival.  pH: 6.8, bicarb: Less than 5.  Beta hydroxybutyric acid: More than 8.  Anion gap: 27-28.  Afebrile with leukocytosis of 18.2.  COVID-19, lactic acid, blood culture: Pending.  UA negative for infection, chest x-ray negative for acute findings.  She is alert and oriented x3.  Patient started on IV fluid bolus and insulin drip in ED. -Admit patient stepdown unit for close monitoring.  On continuous pulse ox.  On telemetry. -Continue insulin gtt. as per Endo tool. -Continue IV fluids.  We will keep her NPO.  Zofran as needed for nausea and vomiting.  BMP every 4 hour, CBG every 30 minutes. -We will start patient on IV bicarb drip due to severe acidosis -Check A1c and C-peptide. -Consult diabetes coordinator  Sepsis secondary to left labial abscess: -History of hidradenitis suppurativa.  Started draining since few days.  No indication of I&D at this time. -Patient met sepsis criteria upon admission.  She is tachycardic, tachypneic, leukocytosis of 18.2, AKI, lactic acid: Pending. -Received Vanco  and Zosyn in ED.  Continue same antibiotics. -Blood culture is pending.  AKI: Likely secondary to dehydration due to nausea, vomiting.  Continue IV fluids.  Hold nephrotoxic medication.  Repeat BMP tomorrow a.m.  Hyperkalemia: -In the setting of DKA/AKI -Reviewed EKG.  Monitor BMP closely.  Elevated troponin: Likely secondary to demand ischemia -Initial troponin 27 trended up to 40.  Trend troponin.  Monitor on telemetry.  Reviewed EKG.  Elevated blood pressure without diagnosis of hypertension: -Could be secondary to  anxiety.  Never diagnosed with hypertension. -Hydralazine as needed for blood pressure more than 160/100.  Sinus tachycardia: -Multifactorial: Dehydration, anxiety, underlying infection -Continue IV fluids and IV antibiotics.  Monitor  closely on telemetry.  -Generalized anxiety disorder: Hold Zoloft for now as patient is NPO.  Tobacco abuse: -Nicotine as needed.  Counseled about cessation  Obesity with BMI of 30: -Diet modification/exercise and weight loss recommended.  Patient is on increased risk of morbidity and mortality. If AG & acidosis   DVT prophylaxis: Lovenox/SCD Code Status: Full code Family Communication:None present at bedside.  Plan of care discussed with patient in length and she verbalized understanding and agreed with it. Disposition Plan: Home in 2 to 3 days Consults called: None Admission status: Inpatient   Mckinley Jewel MD [facility]  If 7PM-7AM, please contact night-coverage [email or web address]  11/17/2019, 7:50 AM

## 2019-11-18 LAB — BETA-HYDROXYBUTYRIC ACID: Beta-Hydroxybutyric Acid: 3.64 mmol/L — ABNORMAL HIGH (ref 0.05–0.27)

## 2019-11-18 LAB — BASIC METABOLIC PANEL
Anion gap: 14 (ref 5–15)
Anion gap: 13 (ref 5–15)
Anion gap: 13 (ref 5–15)
Anion gap: 18 — ABNORMAL HIGH (ref 5–15)
Anion gap: 13 (ref 5–15)
Anion gap: 14 (ref 5–15)
BUN: 6 mg/dL (ref 6–20)
BUN: <5 mg/dL — ABNORMAL LOW (ref 6–20)
BUN: <5 mg/dL — ABNORMAL LOW (ref 6–20)
BUN: <5 mg/dL — ABNORMAL LOW (ref 6–20)
BUN: <5 mg/dL — ABNORMAL LOW (ref 6–20)
BUN: <5 mg/dL — ABNORMAL LOW (ref 6–20)
CO2: 19 mmol/L — ABNORMAL LOW (ref 22–32)
CO2: 21 mmol/L — ABNORMAL LOW (ref 22–32)
CO2: 22 mmol/L (ref 22–32)
CO2: 19 mmol/L — ABNORMAL LOW (ref 22–32)
CO2: 20 mmol/L — ABNORMAL LOW (ref 22–32)
CO2: 19 mmol/L — ABNORMAL LOW (ref 22–32)
Calcium: 8.8 mg/dL — ABNORMAL LOW (ref 8.9–10.3)
Calcium: 8.9 mg/dL (ref 8.9–10.3)
Calcium: 8.8 mg/dL — ABNORMAL LOW (ref 8.9–10.3)
Calcium: 9.1 mg/dL (ref 8.9–10.3)
Calcium: 8.8 mg/dL — ABNORMAL LOW (ref 8.9–10.3)
Calcium: 8.5 mg/dL — ABNORMAL LOW (ref 8.9–10.3)
Chloride: 101 mmol/L (ref 98–111)
Chloride: 102 mmol/L (ref 98–111)
Chloride: 103 mmol/L (ref 98–111)
Chloride: 97 mmol/L — ABNORMAL LOW (ref 98–111)
Chloride: 100 mmol/L (ref 98–111)
Chloride: 99 mmol/L (ref 98–111)
Creatinine, Ser: 0.9 mg/dL (ref 0.44–1.00)
Creatinine, Ser: 0.96 mg/dL (ref 0.44–1.00)
Creatinine, Ser: 0.93 mg/dL (ref 0.44–1.00)
Creatinine, Ser: 1.23 mg/dL — ABNORMAL HIGH (ref 0.44–1.00)
Creatinine, Ser: 1.08 mg/dL — ABNORMAL HIGH (ref 0.44–1.00)
Creatinine, Ser: 0.81 mg/dL (ref 0.44–1.00)
GFR, Estimated: >60 mL/min (ref >60)
GFR, Estimated: >60 mL/min (ref >60)
GFR, Estimated: >60 mL/min (ref >60)
GFR, Estimated: 56 mL/min — ABNORMAL LOW (ref >60)
GFR, Estimated: >60 mL/min (ref >60)
GFR, Estimated: >60 mL/min (ref >60)
Glucose, Bld: 172 mg/dL — ABNORMAL HIGH (ref 70–99)
Glucose, Bld: 167 mg/dL — ABNORMAL HIGH (ref 70–99)
Glucose, Bld: 178 mg/dL — ABNORMAL HIGH (ref 70–99)
Glucose, Bld: 327 mg/dL — ABNORMAL HIGH (ref 70–99)
Glucose, Bld: 233 mg/dL — ABNORMAL HIGH (ref 70–99)
Glucose, Bld: 157 mg/dL — ABNORMAL HIGH (ref 70–99)
Potassium: 3 mmol/L — ABNORMAL LOW (ref 3.5–5.1)
Potassium: 3.1 mmol/L — ABNORMAL LOW (ref 3.5–5.1)
Potassium: 3 mmol/L — ABNORMAL LOW (ref 3.5–5.1)
Potassium: 3.5 mmol/L (ref 3.5–5.1)
Potassium: 3.3 mmol/L — ABNORMAL LOW (ref 3.5–5.1)
Potassium: 3.3 mmol/L — ABNORMAL LOW (ref 3.5–5.1)
Sodium: 134 mmol/L — ABNORMAL LOW (ref 135–145)
Sodium: 136 mmol/L (ref 135–145)
Sodium: 138 mmol/L (ref 135–145)
Sodium: 134 mmol/L — ABNORMAL LOW (ref 135–145)
Sodium: 133 mmol/L — ABNORMAL LOW (ref 135–145)
Sodium: 132 mmol/L — ABNORMAL LOW (ref 135–145)

## 2019-11-18 LAB — GLUCOSE, CAPILLARY
Glucose-Capillary: 105 mg/dL — ABNORMAL HIGH (ref 70–99)
Glucose-Capillary: 158 mg/dL — ABNORMAL HIGH (ref 70–99)
Glucose-Capillary: 162 mg/dL — ABNORMAL HIGH (ref 70–99)
Glucose-Capillary: 168 mg/dL — ABNORMAL HIGH (ref 70–99)
Glucose-Capillary: 175 mg/dL — ABNORMAL HIGH (ref 70–99)
Glucose-Capillary: 176 mg/dL — ABNORMAL HIGH (ref 70–99)
Glucose-Capillary: 178 mg/dL — ABNORMAL HIGH (ref 70–99)
Glucose-Capillary: 183 mg/dL — ABNORMAL HIGH (ref 70–99)
Glucose-Capillary: 184 mg/dL — ABNORMAL HIGH (ref 70–99)
Glucose-Capillary: 185 mg/dL — ABNORMAL HIGH (ref 70–99)
Glucose-Capillary: 189 mg/dL — ABNORMAL HIGH (ref 70–99)
Glucose-Capillary: 191 mg/dL — ABNORMAL HIGH (ref 70–99)
Glucose-Capillary: 194 mg/dL — ABNORMAL HIGH (ref 70–99)
Glucose-Capillary: 213 mg/dL — ABNORMAL HIGH (ref 70–99)
Glucose-Capillary: 277 mg/dL — ABNORMAL HIGH (ref 70–99)
Glucose-Capillary: 300 mg/dL — ABNORMAL HIGH (ref 70–99)
Glucose-Capillary: 319 mg/dL — ABNORMAL HIGH (ref 70–99)

## 2019-11-18 LAB — C-PEPTIDE: C-Peptide: 0.1 ng/mL — ABNORMAL LOW (ref 1.1–4.4)

## 2019-11-18 LAB — HEMOGLOBIN A1C
Hgb A1c MFr Bld: 14.7 % — ABNORMAL HIGH (ref 4.8–5.6)
Mean Plasma Glucose: 375 mg/dL

## 2019-11-18 LAB — MAGNESIUM: Magnesium: 1.4 mg/dL — ABNORMAL LOW (ref 1.7–2.4)

## 2019-11-18 MED ORDER — INSULIN GLARGINE 100 UNIT/ML ~~LOC~~ SOLN
24.0000 [IU] | Freq: Every day | SUBCUTANEOUS | Status: DC
  Administered 2019-11-19: 24 [IU] via SUBCUTANEOUS
  Filled 2019-11-18: qty 0.24

## 2019-11-18 MED ORDER — POTASSIUM CHLORIDE 10 MEQ/100ML IV SOLN
10.0000 meq | INTRAVENOUS | Status: AC
  Administered 2019-11-18 (×2): 10 meq via INTRAVENOUS
  Filled 2019-11-18 (×2): qty 100

## 2019-11-18 MED ORDER — POTASSIUM CHLORIDE CRYS ER 20 MEQ PO TBCR
40.0000 meq | EXTENDED_RELEASE_TABLET | Freq: Once | ORAL | Status: AC
  Administered 2019-11-18: 40 meq via ORAL
  Filled 2019-11-18: qty 2

## 2019-11-18 MED ORDER — INSULIN ASPART 100 UNIT/ML ~~LOC~~ SOLN
0.0000 [IU] | Freq: Three times a day (TID) | SUBCUTANEOUS | Status: DC
  Administered 2019-11-18 – 2019-11-19 (×2): 5 [IU] via SUBCUTANEOUS

## 2019-11-18 MED ORDER — MAGNESIUM SULFATE 2 GM/50ML IV SOLN
2.0000 g | Freq: Once | INTRAVENOUS | Status: AC
  Administered 2019-11-18: 2 g via INTRAVENOUS
  Filled 2019-11-18: qty 50

## 2019-11-18 MED ORDER — INSULIN ASPART 100 UNIT/ML ~~LOC~~ SOLN: 0.0000 [IU] | Freq: Every day | SUBCUTANEOUS | Status: DC

## 2019-11-18 MED ORDER — SERTRALINE HCL 50 MG PO TABS
50.0000 mg | ORAL_TABLET | Freq: Every day | ORAL | Status: DC
  Administered 2019-11-18: 50 mg via ORAL
  Filled 2019-11-18: qty 1

## 2019-11-18 MED ORDER — INSULIN ASPART 100 UNIT/ML ~~LOC~~ SOLN
3.0000 [IU] | Freq: Three times a day (TID) | SUBCUTANEOUS | Status: DC
  Administered 2019-11-18: 3 [IU] via SUBCUTANEOUS

## 2019-11-18 MED ORDER — INSULIN GLARGINE 100 UNIT/ML ~~LOC~~ SOLN
15.0000 [IU] | Freq: Every day | SUBCUTANEOUS | Status: DC
  Administered 2019-11-18: 15 [IU] via SUBCUTANEOUS
  Filled 2019-11-18: qty 0.15

## 2019-11-18 MED ORDER — AMOXICILLIN-POT CLAVULANATE 875-125 MG PO TABS
1.0000 | ORAL_TABLET | Freq: Two times a day (BID) | ORAL | Status: DC
  Administered 2019-11-18 – 2019-11-19 (×3): 1 via ORAL
  Filled 2019-11-18 (×3): qty 1

## 2019-11-18 MED ORDER — INSULIN ASPART 100 UNIT/ML ~~LOC~~ SOLN
5.0000 [IU] | Freq: Once | SUBCUTANEOUS | Status: AC
  Administered 2019-11-18: 5 [IU] via SUBCUTANEOUS

## 2019-11-18 MED ORDER — ALUM & MAG HYDROXIDE-SIMETH 200-200-20 MG/5ML PO SUSP
30.0000 mL | ORAL | Status: DC | PRN
  Administered 2019-11-18: 30 mL via ORAL
  Filled 2019-11-18: qty 30

## 2019-11-18 NOTE — Progress Notes (Signed)
Nutrition Consult, Diet Education  RD consulted for nutrition education regarding new onset diabetes.   Lab Results  Component Value Date   HGBA1C 14.7 (H) 11/17/2019    RD provided "Carbohydrate Counting for People with Diabetes" handout from the Academy of Nutrition and Dietetics. Discussed different food groups and their effects on blood sugar, emphasizing carbohydrate-containing foods. Provided list of carbohydrates and recommended serving sizes of common foods.  Discussed importance of controlled and consistent carbohydrate intake throughout the day. Provided examples of ways to balance meals/snacks and encouraged intake of high-fiber, whole grain complex carbohydrates. Teach back method used.  Expect good compliance.  Body mass index is 27.37 kg/m. Pt meets criteria for normal weight based on current BMI.  Current diet order is CHO modified, patient is consuming approximately 100% of meals at this time. Labs and medications reviewed. No further nutrition interventions warranted at this time. RD contact information provided. If additional nutrition issues arise, please re-consult RD.   Samantha Willis, RD, LDN, CNSC Please refer to [facility] for contact information.

## 2019-11-18 NOTE — Progress Notes (Addendum)
Inpatient Diabetes Program Recommendations  AACE/ADA: New Consensus Statement on Inpatient Glycemic Control (2015)  Target Ranges:  Prepandial:   less than 140 mg/dL      Peak postprandial:   less than 180 mg/dL (1-2 hours)      Critically ill patients:  140 - 180 mg/dL   Lab Results  Component Value Date   GLUCAP 194 (H) 11/18/2019    Review of Glycemic Control Results for Samantha Willis, Samantha Willis (MRN [id number]) as of 11/18/2019 10:11  Ref. Range 11/18/2019 06:31 11/18/2019 07:29 11/18/2019 08:33 11/18/2019 09:31  Glucose-Capillary Latest Ref Range: 70 - 99 mg/dL 183 (H) 191 (H) 213 (H) 194 (H)   Diabetes history: New onset DM Outpatient Diabetes medications: none Current orders for Inpatient glycemic control: IV insulin  Inpatient Diabetes Program Recommendations:    When ready to transition, consider: -Levemir 24 units two hours prior to discontinuation, then QD to follow -Novolog 3 units TID (assuming patient is consuming >50% of meals). -Novolog 0-9 units TID & HS -may want to reorder BMET and beta hydroxybutyric acid as the order set occurrences have been completed.  Attempted to see patient, deeply resting. Will reattempt this afternoon. Patient will need a meter. Blood glucose meter ( includes lancets and strips) (#[id number]).  Addendum: Spoke with patient regarding new onset DM. Patient reports recent symptoms of polyuria, polydipsia, and fatigue. Reviewed patient's current A1c of 14.7%. Explained what a A1c is and what it measures. Also reviewed goal A1c with patient, importance of good glucose control @ home, and blood sugar goals. Reviewed patho of DM, need for insulin, role of pancreas, DKA, differences between long acting vs short acting, signs and symptoms of hypo vs hyperglycemia, vascular changes and commorbidities. Patient will need a meter at discharge. Also, discussed Freestyle Libre CGM device and patient is interested. Order provided from MD for application.  Reviewed recommended CBG frequency and when to call MD. [place] Night applied. Reviewed application, benefits, risks and when to check with fingerstick. Patient denies drinking sugary beverages and usually tries to be mindful of carbohydrate intake given that diabetes "runs in the family". Reviewed alternatives, plate method, how to read nutritional labels and importance of continued mindfulness. Patient does report recent consumption of Ensure due to recent weight loss as a measure to add calories. Discussed carb intake and daily alottment. Dietitian consult placed.  Educated patient on insulin pen use at home. Reviewed contents of insulin flexpen starter kit. Reviewed all steps if insulin pen including attachment of needle, 2-unit air shot, dialing up dose, giving injection, removing needle, disposal of sharps, storage of unused insulin, disposal of insulin etc. Patient able to provide successful return demonstration. Also reviewed troubleshooting with insulin pen. MD to give patient Rxs for insulin pens and insulin pen needles. Encouraged patient to reach out to insurance company for benefits check. Patient able to perform multiple daily injections and expect good compliance. Patient has no further questions at this time.  Thanks, Bronson Curb, MSN, RNC-OB Diabetes Coordinator [phone] (8a-5p)

## 2019-11-18 NOTE — Progress Notes (Signed)
Paged TRAID, about BMP and Beta-Hydroxbutyric Acid results. Patients K 3.0. Awaiting for call back/orders.

## 2019-11-18 NOTE — Progress Notes (Signed)
PROGRESS NOTE    Samantha Willis  ZDG:[id number] DOB: [birth year] DOA: 11/17/2019 PCP: Isaac Bliss, Rayford Halsted, MD     Brief Narrative:  Samantha Willis is a 44 y.o. female with medical history significant of anxiety, obesity, tobacco abuse presents to emergency department with multiple complaints including nausea, vomiting, abdominal pain, chest pain since 2 days. Patient reports that she started having severe nausea and vomiting, could not keep anything down since 2 days.  Reports left-sided abdominal pain, nonradiating, no aggravating or relieving factors, denies association with dysuria, foul-smelling urine, hematuria, or severe diarrhea. Reports left labial swelling since couple of weeks-was associated with fever, chills, pain however it started draining greenish discharge since couple of days and her fever and pain has resolved now.  She is sexually active with her husband only, denies previous history of STDs.  In the emergency department, she was found to be in DKA.  She was started on insulin drip as well as IV fluid.  Also empiric vancomycin and Zosyn for left labial abscess.  New events last 24 hours / Subjective: States that she is doing better this morning.  Labs improving.  Labial abscess has drained and pain has improved per patient's report.  Assessment & Plan:   Principal Problem:   DKA (diabetic ketoacidosis) ([place]) Active Problems:   Anxiety   Heart palpitations   Obesity   AKI (acute kidney injury) ([place])   Left genital labial abscess   Elevated troponin   Tobacco abuse   DKA -Appreciate diabetic coordinator -Hemoglobin A1c pending -Transition off Endotool, Lantus, NovoLog, sliding scale insulin ordered.  Sepsis secondary to left labial abscess, present on admission -Draining and improving on examination -Transition Vanco, Zosyn to Augmentin  AKI  Hypokalemia -Replace, trend  Hypomagnesemia -Replace, trend  Demand ischemia -Flat  trend in troponin  Anxiety -Continue Zoloft   DVT prophylaxis:  enoxaparin (LOVENOX) injection 40 mg Start: 11/17/19 1600 SCDs Start: 11/17/19 0747  Code Status: Full Family Communication: None at bedside Disposition Plan:  Status is: Inpatient  Remains inpatient appropriate because:Persistent severe electrolyte disturbances and IV treatments appropriate due to intensity of illness or inability to take PO   Dispo: The patient is from: Home              Anticipated d/c is to: Home              Anticipated d/c date is: 1 day              Patient currently is not medically stable to d/c.Transition off endotool today, monitor blood sugars closely on subq insulin. Hopeful dc home 10/22 if stable.   Consultants:   None  Procedures:   None  Antimicrobials:  Anti-infectives (From admission, onward)   Start     Dose/Rate Route Frequency Ordered Stop   11/17/19 1800  vancomycin (VANCOREADY) IVPB 750 mg/150 mL        750 mg 150 mL/hr over 60 Minutes Intravenous Every 12 hours 11/17/19 0835     11/17/19 1400  piperacillin-tazobactam (ZOSYN) IVPB 3.375 g        3.375 g 12.5 mL/hr over 240 Minutes Intravenous Every 8 hours 11/17/19 0835     11/17/19 0645  vancomycin (VANCOCIN) IVPB 1000 mg/200 mL premix        1,000 mg 200 mL/hr over 60 Minutes Intravenous  Once 11/17/19 0634 11/17/19 0854   11/17/19 0645  piperacillin-tazobactam (ZOSYN) IVPB 3.375 g        3.375 g  100 mL/hr over 30 Minutes Intravenous  Once 11/17/19 0634 11/17/19 0854   11/17/19 0645  fluconazole (DIFLUCAN) tablet 100 mg        100 mg Oral  Once 11/17/19 0634 11/17/19 0912        Objective: Vitals:   11/18/19 0541 11/18/19 0737 11/18/19 0800 11/18/19 1151  BP: (!) 114/91 113/78 106/70 111/75  Pulse: 77 90 90 99  Resp: '19 19 14 14  ' Temp: 98.2 F (36.8 C) 99.4 F (37.4 C) 98.5 F (36.9 C) 98.5 F (36.9 C)  TempSrc: Oral Oral Oral Oral  SpO2: 100% 98% 98% 99%  Weight: 70.1 kg     Height:         Intake/Output Summary (Last 24 hours) at 11/18/2019 1346 Last data filed at 11/18/2019 0733 Gross per 24 hour  Intake 4368.93 ml  Output 1350 ml  Net 3018.93 ml   Filed Weights   11/17/19 0600 11/18/19 0541  Weight: 78.5 kg 70.1 kg    Examination:  General exam: Appears calm and comfortable  Respiratory system: Clear to auscultation. Respiratory effort normal. No respiratory distress. No conversational dyspnea.  Cardiovascular system: S1 & S2 heard, RRR. No murmurs. No pedal edema. Gastrointestinal system: Abdomen is nondistended, soft and nontender. Normal bowel sounds heard. Central nervous system: Alert and oriented. No focal neurological deficits. Speech clear.  Extremities: Symmetric in appearance  Skin: No appreciable focal abscess in labia, no drainage noted, no pain or warmth or crepitus noted, not painful with palpation  Psychiatry: Judgement and insight appear normal. Mood & affect appropriate.   Data Reviewed: I have personally reviewed following labs and imaging studies  CBC: Recent Labs  Lab 11/17/19 0330 11/17/19 0635 11/17/19 0813  WBC 18.2*  --  21.5*  HGB 14.6 17.3* 14.3  HCT 46.1* 51.0* 44.9  MCV 109.2*  --  109.0*  PLT 371  --  449   Basic Metabolic Panel: Recent Labs  Lab 11/17/19 0813 11/17/19 1204 11/17/19 1448 11/17/19 1824 11/18/19 0108 11/18/19 0601 11/18/19 1055  NA 133*   < > 131* 131* 134* 136 138  K 5.4*   < > 4.7 3.8 3.0* 3.1* 3.0*  CL 102   < > 101 102 101 102 103  CO2 <7*   < > 10* 15* 19* 21* 22  GLUCOSE 486*   < > 268* 266* 172* 167* 178*  BUN 15   < > '10 7 6 ' <5* <5*  CREATININE 1.50*   < > 1.16* 1.16* 0.90 0.96 0.93  CALCIUM 9.3   < > 8.5* 8.8* 8.8* 8.9 8.8*  MG 2.0  --   --   --   --  1.4*  --   PHOS 4.7*  --   --   --   --   --   --    < > = values in this interval not displayed.   GFR: Estimated Creatinine Clearance: 73.3 mL/min (by C-G formula based on SCr of 0.93 mg/dL). Liver Function Tests: Recent Labs  Lab  11/17/19 0330  AST 34  ALT 34  ALKPHOS 105  BILITOT 1.8*  PROT 9.5*  ALBUMIN 4.8   Recent Labs  Lab 11/17/19 0330  LIPASE 25   No results for input(s): AMMONIA in the last 168 hours. Coagulation Profile: No results for input(s): INR, PROTIME in the last 168 hours. Cardiac Enzymes: No results for input(s): CKTOTAL, CKMB, CKMBINDEX, TROPONINI in the last 168 hours. BNP (last 3 results) No results for input(s):  PROBNP in the last 8760 hours. HbA1C: Recent Labs    11/17/19 0813  HGBA1C 14.7*   CBG: Recent Labs  Lab 11/18/19 0729 11/18/19 0833 11/18/19 0931 11/18/19 1031 11/18/19 1131  GLUCAP 191* 213* 194* 175* 158*   Lipid Profile: No results for input(s): CHOL, HDL, LDLCALC, TRIG, CHOLHDL, LDLDIRECT in the last 72 hours. Thyroid Function Tests: No results for input(s): TSH, T4TOTAL, FREET4, T3FREE, THYROIDAB in the last 72 hours. Anemia Panel: No results for input(s): VITAMINB12, FOLATE, FERRITIN, TIBC, IRON, RETICCTPCT in the last 72 hours. Sepsis Labs: Recent Labs  Lab 11/17/19 0813 11/17/19 1204  LATICACIDVEN 4.7* 3.0*    Recent Results (from the past 240 hour(s))  Blood culture (routine x 2)     Status: None (Preliminary result)   Collection Time: 11/17/19  6:00 AM   Specimen: BLOOD RIGHT HAND  Result Value Ref Range Status   Specimen Description BLOOD RIGHT HAND  Final   Special Requests   Final    BOTTLES DRAWN AEROBIC AND ANAEROBIC Blood Culture adequate volume   Culture   Final    NO GROWTH < 12 HOURS Performed at [facility], [place] [address], [place], [place] [address]    Report Status PENDING  Incomplete  Respiratory Panel by RT PCR (Flu A&B, Covid) - Nasopharyngeal Swab     Status: None   Collection Time: 11/17/19  6:21 AM   Specimen: Nasopharyngeal Swab  Result Value Ref Range Status   SARS Coronavirus 2 by RT PCR NEGATIVE NEGATIVE Final    Comment: (NOTE) SARS-CoV-2 target nucleic acids are NOT DETECTED.  The SARS-CoV-2 RNA is  generally detectable in upper respiratoy specimens during the acute phase of infection. The lowest concentration of SARS-CoV-2 viral copies this assay can detect is 131 copies/mL. A negative result does not preclude SARS-Cov-2 infection and should not be used as the sole basis for treatment or other patient management decisions. A negative result may occur with  improper specimen collection/handling, submission of specimen other than nasopharyngeal swab, presence of viral mutation(s) within the areas targeted by this assay, and inadequate number of viral copies (<131 copies/mL). A negative result must be combined with clinical observations, patient history, and epidemiological information. The expected result is Negative.  Fact Sheet for Patients:  PinkCheek.be  Fact Sheet for Healthcare Providers:  GravelBags.it  This test is no t yet approved or cleared by the [place] FDA and  has been authorized for detection and/or diagnosis of SARS-CoV-2 by FDA under an Emergency Use Authorization (EUA). This EUA will remain  in effect (meaning this test can be used) for the duration of the COVID-19 declaration under Section 564(b)(1) of the Act, 21 U.S.C. section 360bbb-3(b)(1), unless the authorization is terminated or revoked sooner.     Influenza A by PCR NEGATIVE NEGATIVE Final   Influenza B by PCR NEGATIVE NEGATIVE Final    Comment: (NOTE) The Xpert Xpress SARS-CoV-2/FLU/RSV assay is intended as an aid in  the diagnosis of influenza from Nasopharyngeal swab specimens and  should not be used as a sole basis for treatment. Nasal washings and  aspirates are unacceptable for Xpert Xpress SARS-CoV-2/FLU/RSV  testing.  Fact Sheet for Patients: PinkCheek.be  Fact Sheet for Healthcare Providers: GravelBags.it  This test is not yet approved or cleared by the [place] FDA and  has been authorized for detection and/or diagnosis of SARS-CoV-2 by  FDA under an Emergency Use Authorization (EUA). This EUA will remain  in effect (meaning this  test can be used) for the duration of the  Covid-19 declaration under Section 564(b)(1) of the Act, 21  U.S.C. section 360bbb-3(b)(1), unless the authorization is  terminated or revoked. Performed at [facility], [place] [address] [place], [place] [address]   Blood culture (routine x 2)     Status: None (Preliminary result)   Collection Time: 11/17/19  6:21 AM   Specimen: BLOOD LEFT HAND  Result Value Ref Range Status   Specimen Description BLOOD LEFT HAND  Final   Special Requests   Final    BOTTLES DRAWN AEROBIC AND ANAEROBIC Blood Culture adequate volume   Culture   Final    NO GROWTH < 12 HOURS Performed at [facility], [place] [address] [place], [place] [address]    Report Status PENDING  Incomplete      Radiology Studies: DG Chest 2 View  Result Date: 11/17/2019 CLINICAL DATA:  Chest pain and lower abdominal pain. Vomiting tonight. EXAM: CHEST - 2 VIEW COMPARISON:  09/23/2004 FINDINGS: Mild hyperinflation. Normal heart size and pulmonary vascularity. No focal airspace disease or consolidation in the lungs. No blunting of costophrenic angles. No pneumothorax. Mediastinal contours appear intact. IMPRESSION: No active cardiopulmonary disease. Electronically Signed   By: Lucienne Capers M.D.   On: 11/17/2019 03:42      Scheduled Meds: . enoxaparin (LOVENOX) injection  40 mg Subcutaneous Q24H  . insulin aspart  0-5 Units Subcutaneous QHS  . insulin aspart  0-9 Units Subcutaneous TID WC  . insulin aspart  3 Units Subcutaneous TID WC  . [START ON 11/19/2019] insulin glargine  24 Units Subcutaneous Daily  . insulin starter kit- pen needles  1 kit Other Once  . living well with diabetes book   Does not apply Once  . nicotine  14 mg Transdermal Daily   Continuous Infusions: . dextrose 5%  lactated ringers 125 mL/hr at 11/18/19 0436  . insulin 1.1 Units/hr (11/18/19 1311)  . lactated ringers Stopped (11/17/19 1940)  . piperacillin-tazobactam (ZOSYN)  IV 3.375 g (11/18/19 0609)  .  sodium bicarbonate (isotonic) infusion in sterile water 75 mL/hr at 11/17/19 1405  . vancomycin 750 mg (11/18/19 0910)     LOS: 1 day      Time spent: 40 minutes   Dessa Phi, DO [facility] 11/18/2019, 1:46 PM   Available via Epic secure chat 7am-7pm After these hours, please refer to coverage provider listed on [email or web address]

## 2019-11-19 ENCOUNTER — Other Ambulatory Visit (HOSPITAL_COMMUNITY): Payer: Self-pay | Admitting: Family Medicine

## 2019-11-19 ENCOUNTER — Other Ambulatory Visit: Payer: Self-pay

## 2019-11-19 ENCOUNTER — Telehealth: Payer: Self-pay | Admitting: Internal Medicine

## 2019-11-19 LAB — MAGNESIUM: Magnesium: 1.8 mg/dL (ref 1.7–2.4)

## 2019-11-19 LAB — BASIC METABOLIC PANEL
Anion gap: 12 (ref 5–15)
BUN: <5 mg/dL — ABNORMAL LOW (ref 6–20)
CO2: 21 mmol/L — ABNORMAL LOW (ref 22–32)
Calcium: 8.6 mg/dL — ABNORMAL LOW (ref 8.9–10.3)
Chloride: 100 mmol/L (ref 98–111)
Creatinine, Ser: 0.88 mg/dL (ref 0.44–1.00)
GFR, Estimated: >60 mL/min (ref >60)
Glucose, Bld: 180 mg/dL — ABNORMAL HIGH (ref 70–99)
Potassium: 3.6 mmol/L (ref 3.5–5.1)
Sodium: 133 mmol/L — ABNORMAL LOW (ref 135–145)

## 2019-11-19 LAB — CBC
HCT: 32.3 % — ABNORMAL LOW (ref 36.0–46.0)
Hemoglobin: 11.3 g/dL — ABNORMAL LOW (ref 12.0–15.0)
MCH: 34.7 pg — ABNORMAL HIGH (ref 26.0–34.0)
MCHC: 35 g/dL (ref 30.0–36.0)
MCV: 99.1 fL (ref 80.0–100.0)
Platelets: 267 10*3/uL (ref 150–400)
RBC: 3.26 MIL/uL — ABNORMAL LOW (ref 3.87–5.11)
RDW: 12.6 % (ref 11.5–15.5)
WBC: 10.2 10*3/uL (ref 4.0–10.5)
nRBC: 0 % (ref 0.0–0.2)

## 2019-11-19 LAB — GLUCOSE, CAPILLARY: Glucose-Capillary: 277 mg/dL — ABNORMAL HIGH (ref 70–99)

## 2019-11-19 MED ORDER — INSULIN GLARGINE 100 UNIT/ML ~~LOC~~ SOLN: 24.0000 [IU] | Freq: Every day | SUBCUTANEOUS | 1 refills | Status: DC

## 2019-11-19 MED ORDER — BLOOD GLUCOSE METER KIT: PACK | 0 refills | Status: AC

## 2019-11-19 MED ORDER — SULFAMETHOXAZOLE-TRIMETHOPRIM 800-160 MG PO TABS: 1.0000 | ORAL_TABLET | Freq: Two times a day (BID) | ORAL | 0 refills | Status: DC

## 2019-11-19 MED ORDER — INSULIN ASPART 100 UNIT/ML ~~LOC~~ SOLN: 3.0000 [IU] | Freq: Three times a day (TID) | SUBCUTANEOUS | 0 refills | Status: DC

## 2019-11-19 MED ORDER — BASAGLAR KWIKPEN 100 UNIT/ML ~~LOC~~ SOPN: 24.0000 [IU] | PEN_INJECTOR | Freq: Every day | SUBCUTANEOUS | 0 refills | Status: DC

## 2019-11-19 NOTE — Care Management (Signed)
11-19-19 Case Manager spoke with patient regarding glucose meters and where to purchase for less. [facility] [employer] on brand for meter and strips. Patient was appreciative of information. Bethena Roys, RN,BSN Case Manager

## 2019-11-19 NOTE — Telephone Encounter (Signed)
Transition Care Management Unsuccessful Follow-up Telephone Call  Date of discharge and from where:  [facility]  Attempts:  1st Attempt  Reason for unsuccessful TCM follow-up call:  Left voice message

## 2019-11-19 NOTE — Discharge Summary (Signed)
Physician Discharge Summary  Samantha Willis PPI:[id number] DOB: 09-24-1975 DOA: 11/17/2019  PCP: Isaac Bliss, Rayford Halsted, MD  Admit date: 11/17/2019 Discharge date: 11/19/2019  Admitted From: Home Disposition: Home  Recommendations for Outpatient Follow-up:  1. Follow up with PCP in 1-2 weeks 2. Follow-up with GYN in 1 to 2 weeks for follow-up of labial abscess 3. Please obtain BMP/CBC in one week 4. Please follow up with your PCP on the following pending results: Unresulted Labs (From admission, onward)          Start     Ordered   11/24/19 0500  Creatinine, serum  (enoxaparin (LOVENOX)    CrCl >/= 30 ml/min)  Weekly,   R     Comments: while on enoxaparin therapy    11/17/19 0747   11/19/19 0500  Hemoglobin A1c  Tomorrow morning,   R        11/18/19 1137           Home Health: None Equipment/Devices: None  Discharge Condition: Stable CODE STATUS: Full code Diet recommendation: Diabetic  Subjective: Seen and examined.  No complaints.  Wants to go home.  Brief/Interim Summary: Samantha Willis is a 44 y.o.femalewith medical history significant ofanxiety, obesity, tobacco abuse presented to ED with multiple complaints including nausea, vomiting, abdominal pain, chest pain since 2 days associated with left-sided abdominal pain.she denied dysuria, foul-smelling urine, hematuria, or severe diarrhea. Reported left labial swelling since couple of weeks-was associated with fever, chills, pain however it started draining greenish discharge since couple of days and her fever and pain had resolved even before coming to ED. She is sexually active with her husband only, denied previous history of STDs.  In the emergency department, she was found to be in DKA.  She was started on insulin drip as well as IV fluid.  Also empiric vancomycin and Zosyn for left labial abscess.  She was admitted to hospital service.  Since the abscess was already draining so no surgical  team was consulted.  She met sepsis criteria based on tachycardia, tachypnea and source of infection/labial abscess.  She also came in with AKI which was likely secondary to dehydration due to DKA.  Also had mild hyperkalemia due to DKA again.  Mildly elevated troponin which were secondary to demand ischemia.  No changes on EKG.  Patient was started on IV fluids, IV insulin drip and was treated for DKA per protocol.  Blood culture remain negative.  Subsequently when her blood sugar improved, her DKA resolved, she was transitioned to Lantus 24 units along with SSI and Premeal 3 units NovoLog.  Patient's anion gap has remained closed.  I saw her today for the first time.  She was asymptomatic.  Labs are looking good.  She was seen by diabetes coordinator and has been provided with all the education.  They have recommended discharging this patient on Lantus, NovoLog and glucometer kit.  Patient is being discharged in stable condition.  She will be discharged on 24 units of glargine, 3 units of NovoLog Premeal 3 times daily as well as Bactrim DS for 7 days for labial abscess.  She will follow with PCP and gynecology.  Discharge Diagnoses:  Principal Problem:   DKA (diabetic ketoacidosis) ([place]) Active Problems:   Anxiety   Heart palpitations   Obesity   AKI (acute kidney injury) ([place])   Left genital labial abscess   Elevated troponin   Tobacco abuse    Discharge Instructions  Discharge Instructions  Ambulatory referral to Nutrition and Diabetic Education   Complete by: As directed      Allergies as of 11/19/2019   No Known Allergies     Medication List    TAKE these medications   blood glucose meter kit and supplies Dispense based on patient and insurance preference. Use up to four times daily as directed. (FOR ICD-10 E10.9, E11.9).   diphenhydrAMINE 25 MG tablet Commonly known as: BENADRYL Take 25 mg by mouth every 6 (six) hours as needed.   ibuprofen 200 MG tablet Commonly known  as: ADVIL Take 200 mg by mouth every 8 (eight) hours as needed.   insulin aspart 100 UNIT/ML injection Commonly known as: novoLOG Inject 3 Units into the skin 3 (three) times daily with meals.   insulin glargine 100 UNIT/ML injection Commonly known as: LANTUS Inject 0.24 mLs (24 Units total) into the skin daily.   sertraline 50 MG tablet Commonly known as: Zoloft Take 1 tablet (50 mg total) by mouth daily.   sulfamethoxazole-trimethoprim 800-160 MG tablet Commonly known as: BACTRIM DS Take 1 tablet by mouth 2 (two) times daily for 7 days.   Vitamin D3 50 MCG (2000 UT) Tabs Take 1 tablet by mouth daily.       Follow-up Information    Isaac Bliss, Rayford Halsted, MD Follow up in 1 week(s).   Specialty: Internal Medicine Contact information: [place] [address] [phone]              No Known Allergies  Consultations: None   Procedures/Studies: DG Chest 2 View  Result Date: 11/17/2019 CLINICAL DATA:  Chest pain and lower abdominal pain. Vomiting tonight. EXAM: CHEST - 2 VIEW COMPARISON:  09/23/2004 FINDINGS: Mild hyperinflation. Normal heart size and pulmonary vascularity. No focal airspace disease or consolidation in the lungs. No blunting of costophrenic angles. No pneumothorax. Mediastinal contours appear intact. IMPRESSION: No active cardiopulmonary disease. Electronically Signed   By: Lucienne Capers M.D.   On: 11/17/2019 03:42      Discharge Exam: Vitals:   11/19/19 0010 11/19/19 0505  BP: 120/77 109/76  Pulse: 98 84  Resp: 18 19  Temp: 98 F (36.7 C) 98.4 F (36.9 C)  SpO2: 98%    Vitals:   11/18/19 1151 11/18/19 1935 11/19/19 0010 11/19/19 0505  BP: 111/75 119/76 120/77 109/76  Pulse: 99 98 98 84  Resp: '14 18 18 19  ' Temp: 98.5 F (36.9 C) 98.4 F (36.9 C) 98 F (36.7 C) 98.4 F (36.9 C)  TempSrc: Oral Oral Oral Oral  SpO2: 99% 100% 98%   Weight:    71.7 kg  Height:        General: Pt is alert, awake, not  in acute distress Cardiovascular: RRR, S1/S2 +, no rubs, no gallops Respiratory: CTA bilaterally, no wheezing, no rhonchi Abdominal: Soft, NT, ND, bowel sounds + Extremities: no edema, no cyanosis    The results of significant diagnostics from this hospitalization (including imaging, microbiology, ancillary and laboratory) are listed below for reference.     Microbiology: Recent Results (from the past 240 hour(s))  Blood culture (routine x 2)     Status: None (Preliminary result)   Collection Time: 11/17/19  6:00 AM   Specimen: BLOOD RIGHT HAND  Result Value Ref Range Status   Specimen Description BLOOD RIGHT HAND  Final   Special Requests   Final    BOTTLES DRAWN AEROBIC AND ANAEROBIC Blood Culture adequate volume   Culture   Final  NO GROWTH 2 DAYS Performed at [facility], [place] [address], [place], [place] [address]    Report Status PENDING  Incomplete  Respiratory Panel by RT PCR (Flu A&B, Covid) - Nasopharyngeal Swab     Status: None   Collection Time: 11/17/19  6:21 AM   Specimen: Nasopharyngeal Swab  Result Value Ref Range Status   SARS Coronavirus 2 by RT PCR NEGATIVE NEGATIVE Final    Comment: (NOTE) SARS-CoV-2 target nucleic acids are NOT DETECTED.  The SARS-CoV-2 RNA is generally detectable in upper respiratoy specimens during the acute phase of infection. The lowest concentration of SARS-CoV-2 viral copies this assay can detect is 131 copies/mL. A negative result does not preclude SARS-Cov-2 infection and should not be used as the sole basis for treatment or other patient management decisions. A negative result may occur with  improper specimen collection/handling, submission of specimen other than nasopharyngeal swab, presence of viral mutation(s) within the areas targeted by this assay, and inadequate number of viral copies (<131 copies/mL). A negative result must be combined with clinical observations, patient history, and epidemiological information.  The expected result is Negative.  Fact Sheet for Patients:  PinkCheek.be  Fact Sheet for Healthcare Providers:  GravelBags.it  This test is no t yet approved or cleared by the [place] FDA and  has been authorized for detection and/or diagnosis of SARS-CoV-2 by FDA under an Emergency Use Authorization (EUA). This EUA will remain  in effect (meaning this test can be used) for the duration of the COVID-19 declaration under Section 564(b)(1) of the Act, 21 U.S.C. section 360bbb-3(b)(1), unless the authorization is terminated or revoked sooner.     Influenza A by PCR NEGATIVE NEGATIVE Final   Influenza B by PCR NEGATIVE NEGATIVE Final    Comment: (NOTE) The Xpert Xpress SARS-CoV-2/FLU/RSV assay is intended as an aid in  the diagnosis of influenza from Nasopharyngeal swab specimens and  should not be used as a sole basis for treatment. Nasal washings and  aspirates are unacceptable for Xpert Xpress SARS-CoV-2/FLU/RSV  testing.  Fact Sheet for Patients: PinkCheek.be  Fact Sheet for Healthcare Providers: GravelBags.it  This test is not yet approved or cleared by the [place] FDA and  has been authorized for detection and/or diagnosis of SARS-CoV-2 by  FDA under an Emergency Use Authorization (EUA). This EUA will remain  in effect (meaning this test can be used) for the duration of the  Covid-19 declaration under Section 564(b)(1) of the Act, 21  U.S.C. section 360bbb-3(b)(1), unless the authorization is  terminated or revoked. Performed at [facility], [place] [address] [place], [place] [address]   Blood culture (routine x 2)     Status: None (Preliminary result)   Collection Time: 11/17/19  6:21 AM   Specimen: BLOOD LEFT HAND  Result Value Ref Range Status   Specimen Description BLOOD LEFT HAND  Final   Special Requests   Final    BOTTLES DRAWN  AEROBIC AND ANAEROBIC Blood Culture adequate volume   Culture   Final    NO GROWTH 2 DAYS Performed at [facility], [place] [address], [place], [place] [address]    Report Status PENDING  Incomplete     Labs: BNP (last 3 results) No results for input(s): BNP in the last 8760 hours. Basic Metabolic Panel: Recent Labs  Lab 11/17/19 0813 11/17/19 1204 11/18/19 0601 11/18/19 0601 11/18/19 1055 11/18/19 1449 11/18/19 1746 11/18/19 2227 11/19/19 0132  NA 133*   < >  136   < > 138 134* 133* 132* 133*  K 5.4*   < > 3.1*   < > 3.0* 3.5 3.3* 3.3* 3.6  CL 102   < > 102   < > 103 97* 100 99 100  CO2 <7*   < > 21*   < > 22 19* 20* 19* 21*  GLUCOSE 486*   < > 167*   < > 178* 327* 233* 157* 180*  BUN 15   < > <5*   < > <5* <5* <5* <5* <5*  CREATININE 1.50*   < > 0.96   < > 0.93 1.23* 1.08* 0.81 0.88  CALCIUM 9.3   < > 8.9   < > 8.8* 9.1 8.8* 8.5* 8.6*  MG 2.0  --  1.4*  --   --   --   --   --  1.8  PHOS 4.7*  --   --   --   --   --   --   --   --    < > = values in this interval not displayed.   Liver Function Tests: Recent Labs  Lab 11/17/19 0330  AST 34  ALT 34  ALKPHOS 105  BILITOT 1.8*  PROT 9.5*  ALBUMIN 4.8   Recent Labs  Lab 11/17/19 0330  LIPASE 25   No results for input(s): AMMONIA in the last 168 hours. CBC: Recent Labs  Lab 11/17/19 0330 11/17/19 0635 11/17/19 0813 11/19/19 0132  WBC 18.2*  --  21.5* 10.2  HGB 14.6 17.3* 14.3 11.3*  HCT 46.1* 51.0* 44.9 32.3*  MCV 109.2*  --  109.0* 99.1  PLT 371  --  318 267   Cardiac Enzymes: No results for input(s): CKTOTAL, CKMB, CKMBINDEX, TROPONINI in the last 168 hours. BNP: Invalid input(s): POCBNP CBG: Recent Labs  Lab 11/18/19 1351 11/18/19 1448 11/18/19 1648 11/18/19 2032 11/19/19 0745  GLUCAP 277* 319* 300* 168* 277*   D-Dimer No results for input(s): DDIMER in the last 72 hours. Hgb A1c Recent Labs    11/17/19 0813  HGBA1C 14.7*   Lipid Profile No results for input(s): CHOL, HDL,  LDLCALC, TRIG, CHOLHDL, LDLDIRECT in the last 72 hours. Thyroid function studies No results for input(s): TSH, T4TOTAL, T3FREE, THYROIDAB in the last 72 hours.  Invalid input(s): FREET3 Anemia work up No results for input(s): VITAMINB12, FOLATE, FERRITIN, TIBC, IRON, RETICCTPCT in the last 72 hours. Urinalysis    Component Value Date/Time   COLORURINE STRAW (A) 11/17/2019 0324   APPEARANCEUR CLEAR 11/17/2019 0324   LABSPEC 1.022 11/17/2019 0324   PHURINE 5.0 11/17/2019 0324   GLUCOSEU >=500 (A) 11/17/2019 0324   HGBUR MODERATE (A) 11/17/2019 0324   BILIRUBINUR NEGATIVE 11/17/2019 0324   KETONESUR 80 (A) 11/17/2019 0324   PROTEINUR 100 (A) 11/17/2019 0324   UROBILINOGEN 0.2 11/25/2009 2141   NITRITE NEGATIVE 11/17/2019 0324   LEUKOCYTESUR NEGATIVE 11/17/2019 0324   Sepsis Labs Invalid input(s): PROCALCITONIN,  WBC,  LACTICIDVEN Microbiology Recent Results (from the past 240 hour(s))  Blood culture (routine x 2)     Status: None (Preliminary result)   Collection Time: 11/17/19  6:00 AM   Specimen: BLOOD RIGHT HAND  Result Value Ref Range Status   Specimen Description BLOOD RIGHT HAND  Final   Special Requests   Final    BOTTLES DRAWN AEROBIC AND ANAEROBIC Blood Culture adequate volume   Culture   Final    NO GROWTH 2 DAYS Performed at [facility], [address], [place], [place] [address]    Report Status PENDING  Incomplete  Respiratory Panel by RT PCR (Flu A&B, Covid) - Nasopharyngeal Swab     Status: None   Collection Time: 11/17/19  6:21 AM   Specimen: Nasopharyngeal Swab  Result Value Ref Range Status   SARS Coronavirus 2 by RT PCR NEGATIVE NEGATIVE Final    Comment: (NOTE) SARS-CoV-2 target nucleic acids are NOT DETECTED.  The SARS-CoV-2 RNA is generally detectable in upper respiratoy specimens during the acute phase of infection. The lowest concentration of SARS-CoV-2 viral copies this assay can detect is 131 copies/mL. A negative result does not  preclude SARS-Cov-2 infection and should not be used as the sole basis for treatment or other patient management decisions. A negative result may occur with  improper specimen collection/handling, submission of specimen other than nasopharyngeal swab, presence of viral mutation(s) within the areas targeted by this assay, and inadequate number of viral copies (<131 copies/mL). A negative result must be combined with clinical observations, patient history, and epidemiological information. The expected result is Negative.  Fact Sheet for Patients:  PinkCheek.be  Fact Sheet for Healthcare Providers:  GravelBags.it  This test is no t yet approved or cleared by the [place] FDA and  has been authorized for detection and/or diagnosis of SARS-CoV-2 by FDA under an Emergency Use Authorization (EUA). This EUA will remain  in effect (meaning this test can be used) for the duration of the COVID-19 declaration under Section 564(b)(1) of the Act, 21 U.S.C. section 360bbb-3(b)(1), unless the authorization is terminated or revoked sooner.     Influenza A by PCR NEGATIVE NEGATIVE Final   Influenza B by PCR NEGATIVE NEGATIVE Final    Comment: (NOTE) The Xpert Xpress SARS-CoV-2/FLU/RSV assay is intended as an aid in  the diagnosis of influenza from Nasopharyngeal swab specimens and  should not be used as a sole basis for treatment. Nasal washings and  aspirates are unacceptable for Xpert Xpress SARS-CoV-2/FLU/RSV  testing.  Fact Sheet for Patients: PinkCheek.be  Fact Sheet for Healthcare Providers: GravelBags.it  This test is not yet approved or cleared by the [place] FDA and  has been authorized for detection and/or diagnosis of SARS-CoV-2 by  FDA under an Emergency Use Authorization (EUA). This EUA will remain  in effect (meaning this test can be used) for the  duration of the  Covid-19 declaration under Section 564(b)(1) of the Act, 21  U.S.C. section 360bbb-3(b)(1), unless the authorization is  terminated or revoked. Performed at [facility], [place] [address], [place], [place] [address]   Blood culture (routine x 2)     Status: None (Preliminary result)   Collection Time: 11/17/19  6:21 AM   Specimen: BLOOD LEFT HAND  Result Value Ref Range Status   Specimen Description BLOOD LEFT HAND  Final   Special Requests   Final    BOTTLES DRAWN AEROBIC AND ANAEROBIC Blood Culture adequate volume   Culture   Final    NO GROWTH 2 DAYS Performed at [facility], [place] [address], [place], [place] [address]    Report Status PENDING  Incomplete     Time coordinating discharge: Over 30 minutes  SIGNED:   Darliss Cheney, MD  [facility] 11/19/2019, 8:59 AM  If 7PM-7AM, please contact night-coverage [email or web address]

## 2019-11-19 NOTE — Discharge Instructions (Signed)
Local Endocrinologists [facility] [phone]) 1. Dr. Philemon Kingdom 2. Dr. Elayne Snare 3. [facility] [phone]) 1. Dr. Delrae Rend [facility] [phone]) 1. Dr. Jacelyn Pi    Diabetic Ketoacidosis Diabetic ketoacidosis is a serious complication of diabetes. This condition develops when there is not enough insulin in the body. Insulin is an hormone that regulates blood sugar levels in the body. Normally, insulin allows glucose to enter the cells in the body. The cells break down glucose for energy. Without enough insulin, the body cannot break down glucose, so it breaks down fats instead. This leads to high blood glucose levels in the body and the production of acids that are called ketones. Ketones are poisonous at high levels. If diabetic ketoacidosis is not treated, it can cause severe dehydration and can lead to a coma or death. What are the causes? This condition develops when a lack of insulin causes the body to break down fats instead of glucose. This may be triggered by: Stress on the body. This stress is brought on by an illness. Infection. Medicines that raise blood glucose levels. Not taking diabetes medicine. New onset of type 1 diabetes mellitus. What are the signs or symptoms? Symptoms of this condition include: Fatigue. Weight loss. Excessive thirst. Light-headedness. Fruity or sweet-smelling breath. Excessive urination. Vision changes. Confusion or irritability. Nausea. Vomiting. Rapid breathing. Abdominal pain. Feeling flushed. How is this diagnosed? This condition is diagnosed based on your medical history, a physical exam, and blood tests. You may also have a urine test to check for ketones. How is this treated? This condition may be treated with: Fluid replacement. This may be done to correct dehydration. Insulin injections. These may be given through the skin or through an IV  tube. Electrolyte replacement. Electrolytes are minerals in your blood. Electrolytes such as potassium and sodium may be given in pill form or through an IV tube. Antibiotic medicines. These may be prescribed if your condition was caused by an infection. Diabetic ketoacidosis is a serious medical condition. You may need emergency treatment in the hospital to monitor your condition. Follow these instructions at home: Eating and drinking Drink enough fluids to keep your urine clear or pale yellow. If you are not able to eat, drink clear fluids in small amounts as you are able. Clear fluids include water, ice chips, fruit juice with water added (diluted), and low-calorie sports drinks. You may also have sugar-free jello or popsicles. If you are able to eat, follow your usual diet and drink sugar-free liquids, such as water. Medicines Take over-the-counter and prescription medicines only as told by your health care provider. Continue to take insulin and other diabetes medicines as told by your health care provider. If you were prescribed an antibiotic, take it as told by your health care provider. Do not stop taking the antibiotic even if you start to feel better. General instructions  Check your urine for ketones when you are ill and as told by your health care provider. If your blood glucose is 240 mg/dL (13.3 mmol/L) or higher, check your urine ketones every 4-6 hours. Check your blood glucose every day, as often as told by your health care provider. If your blood glucose is high, drink plenty of fluids. This helps to flush out ketones. If your blood glucose is above your target for 2 tests in a row, contact your health care provider. Carry a medical alert card or wear medical alert jewelry that says that you have diabetes. Rest and  exercise only as told by your health care provider. Do not exercise when your blood glucose is high and you have ketones in your urine. If you get sick, call your  health care provider and begin treatment quickly. Your body often needs extra insulin to fight an illness. Check your blood glucose every 4-6 hours when you are sick. Keep all follow-up visits as told by your health care provider. This is important. Contact a health care provider if: Your blood glucose level is higher than 240 mg/dL (13.3 mmol/L) for 2 days in a row. You have moderate or large ketones in your urine. You have a fever. You cannot eat or drink without vomiting. You have been vomiting for more than 2 hours. You continue to have symptoms of diabetic ketoacidosis. You develop new symptoms. Get help right away if: Your blood glucose monitor reads "high" even when you are taking insulin. You faint. You have chest pain. You have trouble breathing. You have sudden trouble speaking or swallowing. You have vomiting or diarrhea that gets worse after 3 hours. You are unable to stay awake. You have trouble thinking. You are severely dehydrated. Symptoms of severe dehydration include: Extreme thirst. Dry mouth. Rapid breathing. These symptoms may represent a serious problem that is an emergency. Do not wait to see if the symptoms will go away. Get medical help right away. Call your local emergency services (911 in the [place] Do not drive yourself to the hospital. Summary Diabetic ketoacidosis is a serious complication of diabetes. This condition develops when there is not enough insulin in the body. This condition is diagnosed based on your medical history, a physical exam, and blood tests. You may also have a urine test to check for ketones. Diabetic ketoacidosis is a serious medical condition. You may need emergency treatment in the hospital to monitor your condition. Contact your health care provider if your blood glucose is higher than 240 mg/dl for 2 days in a row or if you have moderate or large ketones in your urine. This information is not intended to replace advice given to you  by your health care provider. Make sure you discuss any questions you have with your health care provider. Document Revised: 03/01/2016 Document Reviewed: 02/19/2016 Elsevier Patient Education  [place]. 2.  3. Dr. Anda Kraft [facility] [phone]) 1. Dr. Daneil Dolin [facility] [phone]) [[place] office]  [phone]) [[place] office] 1. Dr. Lenna Sciara Sol[place] 2. Dr. Mee Hives [facility]) [phone]) 1. Autumn Hudnall Ronnald Ramp), PA 2. Dr. Amalia Greenhouse 3. Dr. Marsh Dolly. [facility] [phone]) 1. Dr. Glade Lloyd [facility] [phone]) 1. Dr. Orville Govern 2. Dr. Lelon Huh 3. Dr. Jerelene Redden 4. Alwyn Ren, FNP Dr. Carolynn Serve. [facility] in [place] [phone])   Diabetes Mellitus and Sick Day Management Blood sugar (glucose) can be difficult to control when you are sick. Common illnesses that can cause problems for people with diabetes (diabetes mellitus) include colds, fever, flu (influenza), nausea, vomiting, and diarrhea. These illnesses can cause stress and loss of body fluids (dehydration), and those issues can cause blood glucose levels to increase. Because of this, it is very important to take your insulin and diabetes medicines and eat some form of carbohydrate when you are sick. You should make a plan for days when you are sick (sick day plan) as part of your diabetes management plan. You and your health care provider should make this  plan in advance. The following guidelines are intended to help you manage an illness that lasts for about 24 hours or less. Your health care provider may also give you more specific instructions. What do I need to do to manage my blood glucose?   Check your blood glucose every 2-4 hours, or as often as told by your health care provider.  Know your sick day treatment goals.  Your target blood glucose levels may be different when you are sick.  If you use insulin, take your usual dose. ? If your blood glucose continues to be too high, you may need to take an additional insulin dose as told by your health care provider.  If you use oral diabetes medicine, you may need to stop taking it if you are not able to eat or drink normally. Ask your health care provider about whether you need to stop taking these medicines while you are sick.  If you use injectable hormone medicines other than insulin to control your diabetes, ask your health care provider about whether you need to stop taking these medicines while you are sick. What else can I do to manage my diabetes when I am sick? Check your ketones  If you have type 1 diabetes, check your urine ketones every 4 hours.  If you have type 2 diabetes, check your urine ketones as often as told by your health care provider. Drink fluids  Drink enough fluid to keep your urine clear or pale yellow. This is especially important if you have a fever, vomiting, or diarrhea. Those symptoms can lead to dehydration.  Follow any instructions from your health care provider about beverages to avoid. ? Do not drink alcohol, caffeine, or drinks that contain a lot of sugar. Take medicines as directed  Take-over-the-counter and prescription medicines only as told by your health care provider.  Check medicine labels for added sugars. Some medicines may contain sugar or types of sugars that can raise your blood glucose level. What foods can I eat when I am sick?  You need to eat some form of carbohydrates when you are sick. You should eat 45-50 grams (45-50 g) of carbohydrates every 3-4 hours until you feel better. All of the food choices below contain about 15 g of carbohydrates. Plan ahead and keep some of these foods around so you have them if you get sick.  4-6 oz (120-177 mL) carbonated beverage that contains sugar, such as regular  (not diet) soda. You may be able to drink carbonated beverages more easily if you open the beverage and let it sit at room temperature for a few minutes before drinking.   of a twin frozen ice pop.  4 oz (120 g) regular gelatin.  4 oz (120 mL) fruit juice.  4 oz (120 g) ice cream or frozen yogurt.  2 oz (60 g) sherbet.  8 oz (240 mL) clear broth or soup.  4 oz (120 g) regular custard.  4 oz (120 g) regular pudding.  8 oz (240 g) plain yogurt.  1 slice bread or toast.  6 saltine crackers.  5 vanilla wafers. Questions to ask your health care provider Consider asking the following questions so you know what to do on days when you are sick:  Should I adjust my diabetes medicines?  How often do I need to check my blood glucose?  What supplies do I need to manage my diabetes at home when I am sick?  What number can I call if  I have questions?  What foods and drinks should I avoid? Contact a health care provider if:  You develop symptoms of diabetic ketoacidosis, such as: ? Fatigue. ? Weight loss. ? Excessive thirst. ? Light-headedness. ? Fruity or sweet-smelling breath. ? Excessive urination. ? Vision changes. ? Confusion or irritability. ? Nausea. ? Vomiting. ? Rapid breathing. ? Pain in the abdomen. ? Feeling flushed.  You are unable to drink fluids without vomiting.  You have any of the following for more than 6 hours: ? Nausea. ? Vomiting. ? Diarrhea.  Your blood glucose is at or above 240 mg/dL (13.3 mmol/L), even after you take an additional insulin dose.  You have a change in how you think, feel, or act (mental status).  You develop another serious illness.  You have been sick or have had a fever for 2 days or longer and you are not getting better. Get help right away if:  Your blood glucose is lower than 54 mg/dL (3.0 mmol/L).  You have difficulty breathing.  You have moderate or high ketone levels in your urine.  You used emergency  glucagon to treat low blood glucose. Summary  Blood sugar (glucose) can be difficult to control when you are sick. Common illnesses that can cause problems for people with diabetes (diabetes mellitus) include colds, fever, flu (influenza), nausea, vomiting, and diarrhea.  Illnesses can cause stress and loss of body fluids (dehydration), and those issues can cause blood glucose levels to increase.  Make a plan for days when you are sick (sick day plan) as part of your diabetes management plan. You and your health care provider should make this plan in advance.  It is very important to take your insulin and diabetes medicines and to eat some form of carbohydrate when you are sick.  Contact your health care provider if have problems managing your blood glucose levels when you are sick, or if you have been sick or had a fever for 2 days or longer and are not getting better. This information is not intended to replace advice given to you by your health care provider. Make sure you discuss any questions you have with your health care provider. Document Revised: 10/13/2015 Document Reviewed: 10/13/2015 Elsevier Patient Education  [place]. Blood Glucose Monitoring, Adult Monitoring your blood sugar (glucose) is an important part of managing your diabetes (diabetes mellitus). Blood glucose monitoring involves checking your blood glucose as often as directed and keeping a record (log) of your results over time. Checking your blood glucose regularly and keeping a blood glucose log can:  Help you and your health care provider adjust your diabetes management plan as needed, including your medicines or insulin.  Help you understand how food, exercise, illnesses, and medicines affect your blood glucose.  Let you know what your blood glucose is at any time. You can quickly find out if you have low blood glucose (hypoglycemia) or high blood glucose (hyperglycemia). Your health care provider will  set individualized treatment goals for you. Your goals will be based on your age, other medical conditions you have, and how you respond to diabetes treatment. Generally, the goal of treatment is to maintain the following blood glucose levels:  Before meals (preprandial): 80-130 mg/dL (4.4-7.2 mmol/L).  After meals (postprandial): below 180 mg/dL (10 mmol/L).  A1c level: less than 7%. Supplies needed:  Blood glucose meter.  Test strips for your meter. Each meter has its own strips. You must use the strips that came with your meter.  A needle to prick your finger (lancet). Do not use a lancet more than one time.  A device that holds the lancet (lancing device).  A journal or log book to write down your results. How to check your blood glucose  1. Wash your hands with soap and water. 2. Prick the side of your finger (not the tip) with the lancet. Use a different finger each time. 3. Gently rub the finger until a small drop of blood appears. 4. Follow instructions that come with your meter for inserting the test strip, applying blood to the strip, and using your blood glucose meter. 5. Write down your result and any notes. Some meters allow you to use areas of your body other than your finger (alternative sites) to test your blood. The most common alternative sites are:  Forearm.  Thigh.  Palm of the hand. If you think you may have hypoglycemia, or if you have a history of not knowing when your blood glucose is getting low (hypoglycemia unawareness), do not use alternative sites. Use your finger instead. Alternative sites may not be as accurate as the fingers, because blood flow is slower in these areas. This means that the result you get may be delayed, and it may be different from the result that you would get from your finger. Follow these instructions at home: Blood glucose log   Every time you check your blood glucose, write down your result. Also write down any notes about  things that may be affecting your blood glucose, such as your diet and exercise for the day. This information can help you and your health care provider: ? Look for patterns in your blood glucose over time. ? Adjust your diabetes management plan as needed.  Check if your meter allows you to download your records to a computer. Most glucose meters store a record of glucose readings in the meter. If you have type 1 diabetes:  Check your blood glucose 2 or more times a day.  Also check your blood glucose: ? Before every insulin injection. ? Before and after exercise. ? Before meals. ? 2 hours after a meal. ? Occasionally between 2:00 a.m. and 3:00 a.m., as directed. ? Before potentially dangerous tasks, like driving or using heavy machinery. ? At bedtime.  You may need to check your blood glucose more often, up to 6-10 times a day, if you: ? Use an insulin pump. ? Need multiple daily injections (MDI). ? Have diabetes that is not well-controlled. ? Are ill. ? Have a history of severe hypoglycemia. ? Have hypoglycemia unawareness. If you have type 2 diabetes:  If you take insulin or other diabetes medicines, check your blood glucose 2 or more times a day.  If you are on intensive insulin therapy, check your blood glucose 4 or more times a day. Occasionally, you may also need to check between 2:00 a.m. and 3:00 a.m., as directed.  Also check your blood glucose: ? Before and after exercise. ? Before potentially dangerous tasks, like driving or using heavy machinery.  You may need to check your blood glucose more often if: ? Your medicine is being adjusted. ? Your diabetes is not well-controlled. ? You are ill. General tips  Always keep your supplies with you.  If you have questions or need help, all blood glucose meters have a 24-hour "hotline" phone number that you can call. You may also contact your health care provider.  After you use a few boxes of test strips, adjust  (  calibrate) your blood glucose meter by following instructions that came with your meter. Contact a health care provider if:  Your blood glucose is at or above 240 mg/dL (13.3 mmol/L) for 2 days in a row.  You have been sick or have had a fever for 2 days or longer, and you are not getting better.  You have any of the following problems for more than 6 hours: ? You cannot eat or drink. ? You have nausea or vomiting. ? You have diarrhea. Get help right away if:  Your blood glucose is lower than 54 mg/dL (3 mmol/L).  You become confused or you have trouble thinking clearly.  You have difficulty breathing.  You have moderate or large ketone levels in your urine. Summary  Monitoring your blood sugar (glucose) is an important part of managing your diabetes (diabetes mellitus).  Blood glucose monitoring involves checking your blood glucose as often as directed and keeping a record (log) of your results over time.  Your health care provider will set individualized treatment goals for you. Your goals will be based on your age, other medical conditions you have, and how you respond to diabetes treatment.  Every time you check your blood glucose, write down your result. Also write down any notes about things that may be affecting your blood glucose, such as your diet and exercise for the day. This information is not intended to replace advice given to you by your health care provider. Make sure you discuss any questions you have with your health care provider. Document Revised: 11/07/2017 Document Reviewed: 06/26/2015 Elsevier Patient Education  [place]. Hypoglycemia Hypoglycemia is when the sugar (glucose) level in your blood is too low. Signs of low blood sugar may include:  Feeling: ? Hungry. ? Worried or nervous (anxious). ? Sweaty and clammy. ? Confused. ? Dizzy. ? Sleepy. ? Sick to your stomach (nauseous).  Having: ? A fast heartbeat. ? A headache. ? A change in  your vision. ? Tingling or no feeling (numbness) around your mouth, lips, or tongue. ? Jerky movements that you cannot control (seizure).  Having trouble with: ? Moving (coordination). ? Sleeping. ? Passing out (fainting). ? Getting upset easily (irritability). Low blood sugar can happen to people who have diabetes and people who do not have diabetes. Low blood sugar can happen quickly, and it can be an emergency. Treating low blood sugar Low blood sugar is often treated by eating or drinking something sugary right away, such as:  Fruit juice, 4-6 oz (120-150 mL).  Regular soda (not diet soda), 4-6 oz (120-150 mL).  Low-fat milk, 4 oz (120 mL).  Several pieces of hard candy.  Sugar or honey, 1 Tbsp (15 mL). Treating low blood sugar if you have diabetes If you can think clearly and swallow safely, follow the 15:15 rule:  Take 15 grams of a fast-acting carb (carbohydrate). Talk with your doctor about how much you should take.  Always keep a source of fast-acting carb with you, such as: ? Sugar tablets (glucose pills). Take 3-4 pills. ? 6-8 pieces of hard candy. ? 4-6 oz (120-150 mL) of fruit juice. ? 4-6 oz (120-150 mL) of regular (not diet) soda. ? 1 Tbsp (15 mL) honey or sugar.  Check your blood sugar 15 minutes after you take the carb.  If your blood sugar is still at or below 70 mg/dL (3.9 mmol/L), take 15 grams of a carb again.  If your blood sugar does not go above 70 mg/dL (  3.9 mmol/L) after 3 tries, get help right away.  After your blood sugar goes back to normal, eat a meal or a snack within 1 hour.  Treating very low blood sugar If your blood sugar is at or below 54 mg/dL (3 mmol/L), you have very low blood sugar (severe hypoglycemia). This may also cause:  Passing out.  Jerky movements you cannot control (seizure).  Losing consciousness (coma). This is an emergency. Do not wait to see if the symptoms will go away. Get medical help right away. Call your local  emergency services (911 in the [place] Do not drive yourself to the hospital. If you have very low blood sugar and you cannot eat or drink, you may need a glucagon shot (injection). A family member or friend should learn how to check your blood sugar and how to give you a glucagon shot. Ask your doctor if you need to have a glucagon shot kit at home. Follow these instructions at home: General instructions  Take over-the-counter and prescription medicines only as told by your doctor.  Stay aware of your blood sugar as told by your doctor.  Limit alcohol intake to no more than 1 drink a day for nonpregnant women and 2 drinks a day for men. One drink equals 12 oz of beer (355 mL), 5 oz of wine (148 mL), or 1 oz of hard liquor (44 mL).  Keep all follow-up visits as told by your doctor. This is important. If you have diabetes:   Follow your diabetes care plan as told by your doctor. Make sure you: ? Know the signs of low blood sugar. ? Take your medicines as told. ? Follow your exercise and meal plan. ? Eat on time. Do not skip meals. ? Check your blood sugar as often as told by your doctor. Always check it before and after exercise. ? Follow your sick day plan when you cannot eat or drink normally. Make this plan ahead of time with your doctor.  Share your diabetes care plan with: ? Your work or school. ? People you live with.  Check your pee (urine) for ketones: ? When you are sick. ? As told by your doctor.  Carry a card or wear jewelry that says you have diabetes. Contact a doctor if:  You have trouble keeping your blood sugar in your target range.  You have low blood sugar often. Get help right away if:  You still have symptoms after you eat or drink something sugary.  Your blood sugar is at or below 54 mg/dL (3 mmol/L).  You have jerky movements that you cannot control.  You pass out. These symptoms may be an emergency. Do not wait to see if the symptoms will go away.  Get medical help right away. Call your local emergency services (911 in the [place] Do not drive yourself to the hospital. Summary  Hypoglycemia happens when the level of sugar (glucose) in your blood is too low.  Low blood sugar can happen to people who have diabetes and people who do not have diabetes. Low blood sugar can happen quickly, and it can be an emergency.  Make sure you know the signs of low blood sugar and know how to treat it.  Always keep a source of sugar (fast-acting carb) with you to treat low blood sugar. This information is not intended to replace advice given to you by your health care provider. Make sure you discuss any questions you have with your health care provider.  Document Revised: 05/07/2018 Document Reviewed: 02/17/2015 Elsevier Patient Education  [place]. Hyperglycemia Hyperglycemia occurs when the level of sugar (glucose) in the blood is too high. Glucose is a type of sugar that provides the body's main source of energy. Certain hormones (insulin and glucagon) control the level of glucose in the blood. Insulin lowers blood glucose, and glucagon increases blood glucose. Hyperglycemia can result from having too little insulin in the bloodstream, or from the body not responding normally to insulin. Hyperglycemia occurs most often in people who have diabetes (diabetes mellitus), but it can happen in people who do not have diabetes. It can develop quickly, and it can be life-threatening if it causes you to become severely dehydrated (diabetic ketoacidosis or hyperglycemic hyperosmolar state). Severe hyperglycemia is a medical emergency. What are the causes? If you have diabetes, hyperglycemia may be caused by:  Diabetes medicine.  Medicines that increase blood glucose or affect your diabetes control.  Not eating enough, or not eating often enough.  Changes in physical activity level.  Being sick or having an infection. If you have prediabetes or  undiagnosed diabetes:  Hyperglycemia may be caused by those conditions. If you do not have diabetes, hyperglycemia may be caused by:  Certain medicines, including steroid medicines, beta-blockers, epinephrine, and thiazide diuretics.  Stress.  Serious illness.  Surgery.  Diseases of the pancreas.  Infection. What increases the risk? Hyperglycemia is more likely to develop in people who have risk factors for diabetes, such as:  Having a family member with diabetes.  Having a gene for type 1 diabetes that is passed from parent to child (inherited).  Living in an area with cold weather conditions.  Exposure to certain viruses.  Certain conditions in which the body's disease-fighting (immune) system attacks itself (autoimmune disorders).  Being overweight or obese.  Having an inactive (sedentary) lifestyle.  Having been diagnosed with insulin resistance.  Having a history of prediabetes, gestational diabetes, or polycystic ovarian syndrome (PCOS).  Being of American-Indian, African-American, Hispanic/Latino, or Asian/Pacific Islander descent. What are the signs or symptoms? Hyperglycemia may not cause any symptoms. If you do have symptoms, they may include early warning signs, such as:  Increased thirst.  Hunger.  Feeling very tired.  Needing to urinate more often than usual.  Blurry vision. Other symptoms may develop if hyperglycemia gets worse, such as:  Dry mouth.  Loss of appetite.  Fruity-smelling breath.  Weakness.  Unexpected or rapid weight gain or weight loss.  Tingling or numbness in the hands or feet.  Headache.  Skin that does not quickly return to normal after being lightly pinched and released (poor skin turgor).  Abdominal pain.  Cuts or bruises that are slow to heal. How is this diagnosed? Hyperglycemia is diagnosed with a blood test to measure your blood glucose level. This blood test is usually done while you are having symptoms.  Your health care provider may also do a physical exam and review your medical history. You may have more tests to determine the cause of your hyperglycemia, such as:  A fasting blood glucose (FBG) test. You will not be allowed to eat (you will fast) for at least 8 hours before a blood sample is taken.  An A1c (hemoglobin A1c) blood test. This provides information about blood glucose control over the previous 2-3 months.  An oral glucose tolerance test (OGTT). This measures your blood glucose at two times: ? After fasting. This is your baseline blood glucose level. ? Two hours after drinking  a beverage that contains glucose. How is this treated? Treatment depends on the cause of your hyperglycemia. Treatment may include:  Taking medicine to regulate your blood glucose levels. If you take insulin or other diabetes medicines, your medicine or dosage may be adjusted.  Lifestyle changes, such as exercising more, eating healthier foods, or losing weight.  Treating an illness or infection, if this caused your hyperglycemia.  Checking your blood glucose more often.  Stopping or reducing steroid medicines, if these caused your hyperglycemia. If your hyperglycemia becomes severe and it results in hyperglycemic hyperosmolar state, you must be hospitalized and given IV fluids. Follow these instructions at home:  General instructions  Take over-the-counter and prescription medicines only as told by your health care provider.  Do not use any products that contain nicotine or tobacco, such as cigarettes and e-cigarettes. If you need help quitting, ask your health care provider.  Limit alcohol intake to no more than 1 drink per day for nonpregnant women and 2 drinks per day for men. One drink equals 12 oz of beer, 5 oz of wine, or 1 oz of hard liquor.  Learn to manage stress. If you need help with this, ask your health care provider.  Keep all follow-up visits as told by your health care  provider. This is important. Eating and drinking   Maintain a healthy weight.  Exercise regularly, as directed by your health care provider.  Stay hydrated, especially when you exercise, get sick, or spend time in hot temperatures.  Eat healthy foods, such as: ? Lean proteins. ? Complex carbohydrates. ? Fresh fruits and vegetables. ? Low-fat dairy products. ? Healthy fats.  Drink enough fluid to keep your urine clear or pale yellow. If you have diabetes:  Make sure you know the symptoms of hyperglycemia.  Follow your diabetes management plan, as told by your health care provider. Make sure you: ? Take your insulin and medicines as directed. ? Follow your exercise plan. ? Follow your meal plan. Eat on time, and do not skip meals. ? Check your blood glucose as often as directed. Make sure to check your blood glucose before and after exercise. If you exercise longer or in a different way than usual, check your blood glucose more often. ? Follow your sick day plan whenever you cannot eat or drink normally. Make this plan in advance with your health care provider.  Share your diabetes management plan with people in your workplace, school, and household.  Check your urine for ketones when you are ill and as told by your health care provider.  Carry a medical alert card or wear medical alert jewelry. Contact a health care provider if:  Your blood glucose is at or above 240 mg/dL (13.3 mmol/L) for 2 days in a row.  You have problems keeping your blood glucose in your target range.  You have frequent episodes of hyperglycemia. Get help right away if:  You have difficulty breathing.  You have a change in how you think, feel, or act (mental status).  You have nausea or vomiting that does not go away. These symptoms may represent a serious problem that is an emergency. Do not wait to see if the symptoms will go away. Get medical help right away. Call your local emergency services  (911 in the [place] Do not drive yourself to the hospital. Summary  Hyperglycemia occurs when the level of sugar (glucose) in the blood is too high.  Hyperglycemia is diagnosed with a blood test to  measure your blood glucose level. This blood test is usually done while you are having symptoms. Your health care provider may also do a physical exam and review your medical history.  If you have diabetes, follow your diabetes management plan as told by your health care provider.  Contact your health care provider if you have problems keeping your blood glucose in your target range. This information is not intended to replace advice given to you by your health care provider. Make sure you discuss any questions you have with your health care provider. Document Revised: 10/02/2015 Document Reviewed: 10/02/2015 Elsevier Patient Education  [place]. Hemoglobin A1c Test Why am I having this test? You may have the hemoglobin A1c test (HbA1c test) done to:  Evaluate your risk for developing diabetes (diabetes mellitus).  Diagnose diabetes.  Monitor long-term control of blood sugar (glucose) in people who have diabetes and help make treatment decisions. This test may be done with other blood glucose tests, such as fasting blood glucose and oral glucose tolerance tests. What is being tested? Hemoglobin is a type of protein in the blood that carries oxygen. Glucose attaches to hemoglobin to form glycated hemoglobin. This test checks the amount of glycated hemoglobin in your blood, which is a good indicator of the average amount of glucose in your blood during the past 2-3 months. What kind of sample is taken?  A blood sample is required for this test. It is usually collected by inserting a needle into a blood vessel. Tell a health care provider about:  All medicines you are taking, including vitamins, herbs, eye drops, creams, and over-the-counter medicines.  Any blood disorders you  have.  Any surgeries you have had.  Any medical conditions you have.  Whether you are pregnant or may be pregnant. How are the results reported? Your results will be reported as a percentage that indicates how much of your hemoglobin has glucose attached to it (is glycated). Your health care provider will compare your results to normal ranges that were established after testing a large group of people (reference ranges). Reference ranges may vary among labs and hospitals. For this test, common reference ranges are:  Adult or child without diabetes: 4-5.6%.  Adult or child with diabetes and good blood glucose control: less than 7%. What do the results mean? If you have diabetes:  A result of less than 7% is considered normal, meaning that your blood glucose is well controlled.  A result higher than 7% means that your blood glucose is not well controlled, and your treatment plan may need to be adjusted. If you do not have diabetes:  A result within the reference range is considered normal, meaning that you are not at high risk for diabetes.  A result of 5.7-6.4% means that you have a high risk of developing diabetes, and you may have prediabetes. Prediabetes is the condition of having a blood glucose level that is higher than it should be, but not high enough for you to be diagnosed with diabetes. Having prediabetes puts you at risk for developing type 2 diabetes (type 2 diabetes mellitus). You may have more tests, including a repeat HbA1c test.  Results of 6.5% or higher on two separate HbA1c tests mean that you have diabetes. You may have more tests to confirm the diagnosis. Abnormally low HbA1c values may be caused by:  Pregnancy.  Severe blood loss.  Receiving donated blood (transfusions).  Low red blood cell count (anemia).  Long-term kidney failure.  Some unusual forms (variants) of hemoglobin. Talk with your health care provider about what your results mean. Questions to  ask your health care provider Ask your health care provider, or the department that is doing the test:  When will my results be ready?  How will I get my results?  What are my treatment options?  What other tests do I need?  What are my next steps? Summary  The hemoglobin A1c test (HbA1c test) may be done to evaluate your risk for developing diabetes, to diagnose diabetes, and to monitor long-term control of blood sugar (glucose) in people who have diabetes and help make treatment decisions.  Hemoglobin is a type of protein in the blood that carries oxygen. Glucose attaches to hemoglobin to form glycated hemoglobin. This test checks the amount of glycated hemoglobin in your blood, which is a good indicator of the average amount of glucose in your blood during the past 2-3 months.  Talk with your health care provider about what your results mean. This information is not intended to replace advice given to you by your health care provider. Make sure you discuss any questions you have with your health care provider. Document Revised: 12/27/2016 Document Reviewed: 08/27/2016 Elsevier Patient Education  [place].

## 2019-11-19 NOTE — Consult Note (Signed)
   [facility] CM Inpatient Consult   11/19/2019  Samantha Willis [birth year] [id number]  Follow up:  Attempts to reach out to patient.  Plan:  Will assign for post hospital referral follow up.  Natividad Brood, RN BSN [facility] Liaison  [phone] business mobile phone Toll free office [phone]  Fax number: [phone] [email or web address]

## 2019-11-20 LAB — HEMOGLOBIN A1C
Hgb A1c MFr Bld: 14.2 % — ABNORMAL HIGH (ref 4.8–5.6)
Mean Plasma Glucose: 361 mg/dL

## 2019-11-22 ENCOUNTER — Other Ambulatory Visit: Payer: Self-pay | Admitting: *Deleted

## 2019-11-22 ENCOUNTER — Telehealth: Payer: Self-pay | Admitting: *Deleted

## 2019-11-22 LAB — CULTURE, BLOOD (ROUTINE X 2)
Culture: NO GROWTH
Culture: NO GROWTH
Special Requests: ADEQUATE
Special Requests: ADEQUATE

## 2019-11-22 NOTE — Telephone Encounter (Signed)
I have made the 2nd attempt to contact the patient or family member in charge, in order to follow up from recently being discharged from the hospital. I left a message on voicemail requesting a CB. -MM  

## 2019-11-22 NOTE — Patient Outreach (Signed)
[place] [facility]) Care Management  11/22/2019  Samantha Willis 06/09/1975 [id number]   Telephone Screening  Transition of Care by PCP   Referral received: 11/19/19 Referral source: [facility] Liaison  Date of Admission : 11/17/19 Diagnosis : Diabetic Ketoacidosis, New onset Diabetes  Facility : [place] attempt #1  Subjective: Unsuccessful outreach call to patient, no answer able to leave a HIPAA compliant voicemail message for return call.   I also received the following  communication by email on 11/22/19.  Received communication from [place] from  patient outreach on 11/20/19 to [facility] triage line.  Pt contacted [facility] surveyor with concerned about blood sugar level of 391 . Pt is a newly diagnosed type 1 diabetic who's currently on antibiotic and insulin. Pt states she ate mac and cheese and potpie. Pt denies being thirsty, increased urination, blurred vision, or fatigue. Pt informed to call provider tomorrow with her concerns. Pt advised to follow care advice handout sent via email and callback if symptoms develop. .   Plan Will send unsuccessful outreach letter and plan return call in the next 4 business days if no return call from patient on today.    Joylene Draft, RN, BSN  [place] Management Coordinator  [phone]- Mobile [phone]- Toll Free Main Office

## 2019-11-22 NOTE — Telephone Encounter (Signed)
Patient called stating she is returning a call to the nurse

## 2019-11-23 NOTE — Telephone Encounter (Signed)
I have no record of calling the patient.

## 2019-11-24 ENCOUNTER — Encounter: Payer: Self-pay | Admitting: Internal Medicine

## 2019-11-24 ENCOUNTER — Telehealth: Payer: Self-pay | Admitting: Internal Medicine

## 2019-11-24 NOTE — Telephone Encounter (Signed)
Transition Care Management Unsuccessful Follow-up Telephone Call  Date of discharge and from where:  11/19/2019 from [facility]  Attempts:  3rd Attempt  Reason for unsuccessful TCM follow-up call:  Unable to leave message

## 2019-11-24 NOTE — Telephone Encounter (Signed)
Transition Care Management Follow-up Telephone Call  Date of discharge and from where: 11/19/2019 from [facility]   How have you been since you were released from the hospital? Patient has been ok has been concerned about blood sugars. They have been running 170-350 Any questions or concerns? Yes , has concerns of blood sugars that have still been running elevated. Items Reviewed:  Did the pt receive and understand the discharge instructions provided? Yes   Medications obtained and verified? Yes   Other? No   Any new allergies since your discharge? No   Dietary orders reviewed? Yes  Do you have support at home? Yes   Home Care and Equipment/Supplies: Were home health services ordered? no If so, what is the name of the agency? n/a  Has the agency set up a time to come to the patient's home? not applicable Were any new equipment or medical supplies ordered?  No What is the name of the medical supply agency? N/A Were you able to get the supplies/equipment? not applicable Do you have any questions related to the use of the equipment or supplies? No  Functional Questionnaire: (I = Independent and D = Dependent) ADLs: I  Bathing/Dressing- I  Meal Prep- I  Eating- I  Maintaining continence- I  Transferring/Ambulation- I  Managing Meds- I  Follow up appointments reviewed:   PCP Hospital f/u appt confirmed? Yes  Scheduled to see Dr. Jerilee Hoh on 12/01/2019 @ 11:30 am.  [facility] f/u appt confirmed? No    Are transportation arrangements needed? No   If their condition worsens, is the pt aware to call PCP or go to the Emergency Dept.? Yes  Was the patient provided with contact information for the PCP's office or ED? Yes  Was to pt encouraged to call back with questions or concerns? Yes

## 2019-11-24 NOTE — Telephone Encounter (Signed)
Letter sent to patient informing her that we have been trying to reach her

## 2019-11-25 ENCOUNTER — Other Ambulatory Visit: Payer: Self-pay | Admitting: *Deleted

## 2019-11-25 NOTE — Patient Outreach (Signed)
[facility] [facility]) Care Management  [place]  11/25/2019   Samantha Willis 09-Mar-1975 [id number]  Samantha Willis [birth year] [id number]   Telephone Screening  Transition of Care by PCP   Referral received: 11/19/19 Referral source: [facility] Liaison  Date of Admission : 11/17/19 Diagnosis : Diabetic Ketoacidosis, New onset Diabetes  Facility : [place] attempt #2  Subjective: Unsuccessful outreach call to patient, no answer able to leave a HIPAA compliant voicemail message for return call.   I also received the following  communication by email on 11/22/19.  Received communication from [place] from  patient outreach on 11/20/19 to [facility] triage line.  Pt contacted [facility] surveyor with concerned about blood sugar level of 391 . Pt is a newly diagnosed type 1 diabetic who's currently on antibiotic and insulin. Pt states she ate mac and cheese and potpie. Pt denies being thirsty, increased urination, blurred vision, or fatigue. Pt informed to call provider tomorrow with her concerns. Pt advised to follow care advice handout sent via email and callback if symptoms develop. .   Plan Will plan return call in  next 4 business days if no return call from patient on today.   Joylene Draft, RN, BSN  [place] Management Coordinator  [phone]- Mobile [phone]- Toll Free Main Office

## 2019-11-30 ENCOUNTER — Other Ambulatory Visit: Payer: Self-pay | Admitting: *Deleted

## 2019-11-30 NOTE — Patient Outreach (Signed)
[facility]) Care Management  11/30/2019  Nailyn Dear[place] 1975-11-01 [id number]   Telephone Screening  Transition of Careby PCP  Referral received:11/19/19 Referralsource: [facility] Liaison  Date of Admission : 11/17/19 Diagnosis : Diabetic Ketoacidosis, New onset Diabetes  Facility : [place] attempt #3 Unsuccessful call attempt, no answer able to leave a HIPAA compliant voicemail message for return call.   Plan If no return call on today, will plan return call in the next 3 weeks.   Joylene Draft, RN, BSN  [place] Management Coordinator  [phone]- Mobile [phone]- Toll Free Main Office

## 2019-12-01 ENCOUNTER — Telehealth: Payer: Self-pay | Admitting: Internal Medicine

## 2019-12-01 ENCOUNTER — Inpatient Hospital Stay: Payer: 59 | Admitting: Internal Medicine

## 2019-12-01 ENCOUNTER — Other Ambulatory Visit: Payer: Self-pay

## 2019-12-01 DIAGNOSIS — L0293 Carbuncle, unspecified: Secondary | ICD-10-CM

## 2019-12-01 NOTE — Telephone Encounter (Signed)
Left message on machine for patient returning her call 

## 2019-12-01 NOTE — Telephone Encounter (Signed)
Not sure what this is about. Is this boil on her private areas?

## 2019-12-01 NOTE — Telephone Encounter (Signed)
Pt needs a referral for GYN - pt was referred to [facility] for her boil. The office stated, "Rejection Reason - Unapproved Service - Per triage, we do not treat dx. Would recommend pt see OB/GYN for dx. " [facility] said on Jul 06, 2019, 1:44 PM.

## 2019-12-03 NOTE — Telephone Encounter (Signed)
Referral placed with GYN

## 2019-12-15 ENCOUNTER — Other Ambulatory Visit: Payer: Self-pay

## 2019-12-15 ENCOUNTER — Ambulatory Visit (INDEPENDENT_AMBULATORY_CARE_PROVIDER_SITE_OTHER): Payer: 59 | Admitting: Internal Medicine

## 2019-12-15 ENCOUNTER — Encounter: Payer: Self-pay | Admitting: Internal Medicine

## 2019-12-15 VITALS — BP 102/62 | HR 79 | Temp 98.7°F | Wt 167.0 lb

## 2019-12-15 DIAGNOSIS — Z09 Encounter for follow-up examination after completed treatment for conditions other than malignant neoplasm: Secondary | ICD-10-CM | POA: Diagnosis not present

## 2019-12-15 DIAGNOSIS — Z794 Long term (current) use of insulin: Secondary | ICD-10-CM

## 2019-12-15 DIAGNOSIS — E119 Type 2 diabetes mellitus without complications: Secondary | ICD-10-CM | POA: Insufficient documentation

## 2019-12-15 DIAGNOSIS — E1165 Type 2 diabetes mellitus with hyperglycemia: Secondary | ICD-10-CM | POA: Diagnosis not present

## 2019-12-15 MED ORDER — INSULIN PEN NEEDLE 32G X 4 MM MISC: 3 refills | Status: DC

## 2019-12-15 MED ORDER — FREESTYLE LIBRE 14 DAY SENSOR MISC: 12 refills | Status: DC

## 2019-12-15 NOTE — Progress Notes (Signed)
Established Patient Office Visit     This visit occurred during the SARS-CoV-2 public health emergency.  Safety protocols were in place, including screening questions prior to the visit, additional usage of staff PPE, and extensive cleaning of exam room while observing appropriate contact time as indicated for disinfecting solutions.    CC/Reason for Visit: Hospital follow-up  HPI: Samantha Willis is a 44 y.o. female who is coming in today for the above mentioned reasons. Past Medical History is significant for: Anxiety and newly diagnosed diabetes.  She presented to the hospital due to fatigue, polydipsia, polyphagia, palpitations, dizziness and weight loss.  Upon discharge she was placed on Basaglar 24 units which she has been taking in the morning, she has also been doing rapid acting insulin 3 units with each meal.  She tells me that her fasting blood sugars are usually around 90-100, after breakfast usually around 136, after lunch in the 150 range and after dinner in the 100s.  Her A1c upon presentation on October 22 was 14.2.  She has not yet had a microalbumin.  She did have an eye exam about 2 weeks ago and did inform her eye practitioner that she is newly diagnosed diabetic.  She has not yet had her lipids checked.  She has not yet had diabetes education especially as pertains to diet.  She has been eating mostly protein and vegetables and has been reducing her carbohydrate content.  She was given a freestyle libre while in the hospital and is wondering if she can be prescribed this today.   Past Medical/Surgical History: Past Medical History:  Diagnosis Date  . Anxiety   . Carpal tunnel syndrome 05/06/2014    Bilateral  . Diabetic ketoacidosis ([place]) 11/17/2019  . GERD (gastroesophageal reflux disease)   . Graves disease    Graves Disease  . Heart palpitations   . Obesity     Past Surgical History:  Procedure Laterality Date  . Biopsy of Right Breast     At [facility] Right 11/21/2015   Procedure: CARPAL TUNNEL RELEASE right;  Surgeon: Daryll Brod, MD;  Location: [facility];  Service: Orthopedics;  Laterality: Right;  FAB  . CARPAL TUNNEL RELEASE Left 06/11/2016   Procedure: LEFT CARPAL TUNNEL RELEASE;  Surgeon: Daryll Brod, MD;  Location: [facility];  Service: Orthopedics;  Laterality: Left;  REG/FAB  . CESAREAN SECTION    . TUBAL LIGATION  2011  . WISDOM TOOTH EXTRACTION      Social History:  reports that she has been smoking. She has been smoking about 0.25 packs per day. She has never used smokeless tobacco. She reports current alcohol use. She reports that she does not use drugs.  Allergies: No Known Allergies  Family History:  Family History  Problem Relation Age of Onset  . Heart attack Mother   . Cancer Mother 59       breast  . Diabetes Mother        II  . Hypertension Father   . Hyperlipidemia Father   . Gout Father   . Healthy Sister   . Healthy Brother   . Healthy Brother      Current Outpatient Medications:  .  blood glucose meter kit and supplies, Dispense based on patient and insurance preference. Use up to four times daily as directed. (FOR ICD-10 E10.9, E11.9)., Disp: 1 each, Rfl: 0 .  Cholecalciferol (VITAMIN D3) 2000 UNITS  TABS, Take 1 tablet by mouth daily., Disp: , Rfl:  .  diphenhydrAMINE (BENADRYL) 25 MG tablet, Take 25 mg by mouth every 6 (six) hours as needed., Disp: , Rfl:  .  ibuprofen (ADVIL,MOTRIN) 200 MG tablet, Take 200 mg by mouth every 8 (eight) hours as needed., Disp: , Rfl:  .  insulin aspart (NOVOLOG) 100 UNIT/ML injection, Inject 3 Units into the skin 3 (three) times daily with meals., Disp: 10 mL, Rfl: 0 .  Insulin Glargine (BASAGLAR KWIKPEN) 100 UNIT/ML, Inject 24 Units into the skin daily., Disp: 9 mL, Rfl: 0 .  sertraline (ZOLOFT) 50 MG tablet, Take 1 tablet (50 mg total) by mouth daily., Disp: 90 tablet, Rfl: 1 .  Continuous Blood  Gluc Sensor (FREESTYLE LIBRE 14 DAY SENSOR) MISC, Apply one sensor every 14 days., Disp: 1 each, Rfl: 12 .  Insulin Pen Needle 32G X 4 MM MISC, Use daily for glucose control, Disp: 100 each, Rfl: 3  Review of Systems:  Constitutional: Denies fever, chills, diaphoresis, appetite change and fatigue.  HEENT: Denies photophobia, eye pain, redness, hearing loss, ear pain, congestion, sore throat, rhinorrhea, sneezing, mouth sores, trouble swallowing, neck pain, neck stiffness and tinnitus.   Respiratory: Denies SOB, DOE, cough, chest tightness,  and wheezing.   Cardiovascular: Denies chest pain, palpitations and leg swelling.  Gastrointestinal: Denies nausea, vomiting, abdominal pain, diarrhea, constipation, blood in stool and abdominal distention.  Genitourinary: Denies dysuria, urgency, frequency, hematuria, flank pain and difficulty urinating.  Endocrine: Denies: hot or cold intolerance, sweats, changes in hair or nails, polyuria, polydipsia. Musculoskeletal: Denies myalgias, back pain, joint swelling, arthralgias and gait problem.  Skin: Denies pallor, rash and wound.  Neurological: Denies dizziness, seizures, syncope, weakness, light-headedness, numbness and headaches.  Hematological: Denies adenopathy. Easy bruising, personal or family bleeding history  Psychiatric/Behavioral: Denies suicidal ideation, mood changes, confusion, nervousness, sleep disturbance and agitation    Physical Exam: Vitals:   12/15/19 1541  BP: 102/62  Pulse: 79  Temp: 98.7 F (37.1 C)  TempSrc: Oral  SpO2: 97%  Weight: 167 lb (75.8 kg)    Body mass index is 29.58 kg/m.   Constitutional: NAD, calm, comfortable Eyes: PERRL, lids and conjunctivae normal ENMT: Mucous membranes are moist.  Respiratory: clear to auscultation bilaterally, no wheezing, no crackles. Normal respiratory effort. No accessory muscle use.  Cardiovascular: Regular rate and rhythm, no murmurs / rubs / gallops. No extremity  edema. Neurologic: Grossly intact and nonfocal Psychiatric: Normal judgment and insight. Alert and oriented x 3. Normal mood.    Impression and Plan:  Hospital discharge follow-up  Type 2 diabetes mellitus with hyperglycemia, with long-term current use of insulin ([place])  -With her current CBGs, I suspect her A1c will be much improved upon follow-up visit. -I will refer her to diabetes education. -Samples of Basaglar and Humalog have been given today. -Her eye exam is up-to-date. -I will check a microalbumin today. -She will also have lipids checked today, goal LDL is less than 70. -Follow-up in 3 months.    Patient Instructions  -Nice seeing you today!!  -Lab work today; will notify you once results are available.  -Schedule follow up in 3 months.     Lelon Frohlich, MD [facility]

## 2019-12-15 NOTE — Patient Instructions (Signed)
-  Nice seeing you today!!  -Lab work today; will notify you once results are available.  -Schedule follow up in 3 months. 

## 2019-12-21 ENCOUNTER — Other Ambulatory Visit: Payer: Self-pay | Admitting: *Deleted

## 2019-12-21 NOTE — Patient Outreach (Signed)
[facility]) Care Management  12/21/2019  Samantha Willis 1975/05/23 [id number]   Telephone Screening  Transition of Careby PCP  Referral received:11/19/19 Referralsource: [facility] Liaison  Date of Admission : 11/17/19 Diagnosis : Diabetic Ketoacidosis, New onset Diabetes  Facility : [facility] attempt #4 Unsuccessful call attempt, no answer able to leave a HIPAA compliant voicemail message for return call.   Plan Unsuccessful call attempt x4, no response from outreach letter, will plan case closure per workflow.   Joylene Draft, RN, BSN  [place] Management Coordinator  [phone]- Mobile [phone]- Toll Free Main Office .

## 2019-12-29 ENCOUNTER — Telehealth: Payer: Self-pay | Admitting: Internal Medicine

## 2019-12-29 NOTE — Telephone Encounter (Signed)
FMLA forms have been received by fax  When completed fax to: [phone]  Disposition: Dr's Folder

## 2019-12-29 NOTE — Telephone Encounter (Signed)
Pt is checking up on FMLA forms please call [phone]

## 2019-12-29 NOTE — Telephone Encounter (Signed)
Pt called to check the status of the form and to stated that she has a deadline to get them by in to her job.  Pt is aware that the fax machine had been down and that the forms has been placed in the providers folder for completion.

## 2019-12-31 NOTE — Telephone Encounter (Signed)
Patient is aware that the form has been completed and faxed. Copy placed upfront.

## 2020-01-03 ENCOUNTER — Telehealth: Payer: Self-pay | Admitting: Internal Medicine

## 2020-01-03 NOTE — Telephone Encounter (Signed)
insulin aspart (NOVOLOG) 100 UNIT/ML injection  [id number] - [place], [place] Phone:  [phone]  Fax:  [phone]

## 2020-01-04 ENCOUNTER — Telehealth: Payer: Self-pay | Admitting: Internal Medicine

## 2020-01-04 MED ORDER — INSULIN ASPART 100 UNIT/ML ~~LOC~~ SOLN: 3.0000 [IU] | Freq: Three times a day (TID) | SUBCUTANEOUS | 0 refills | Status: DC

## 2020-01-04 NOTE — Telephone Encounter (Signed)
Is this a refill request? If so, ok to send.

## 2020-01-04 NOTE — Addendum Note (Signed)
Addended by: Westley Hummer B on: 01/04/2020 08:04 AM   Modules accepted: Orders

## 2020-01-04 NOTE — Telephone Encounter (Signed)
Refill sent.

## 2020-01-04 NOTE — Telephone Encounter (Signed)
Patient needs needles called into the pharmacy for her insulin. She was using the pen but know she is on the valve and the was no needles when she picked it up.  [id number] - [place], [place] Phone:  [phone]  Fax:  [phone]

## 2020-01-05 MED ORDER — "INSULIN SYRINGE-NEEDLE U-100 27G X 1/2"" 1 ML MISC": 3 refills | Status: AC

## 2020-01-05 NOTE — Telephone Encounter (Signed)
Refill sent.

## 2020-01-12 ENCOUNTER — Encounter: Payer: 59 | Attending: Internal Medicine | Admitting: Registered"

## 2020-01-12 ENCOUNTER — Ambulatory Visit (INDEPENDENT_AMBULATORY_CARE_PROVIDER_SITE_OTHER): Payer: 59 | Admitting: Nurse Practitioner

## 2020-01-12 ENCOUNTER — Other Ambulatory Visit: Payer: Self-pay

## 2020-01-12 ENCOUNTER — Encounter: Payer: Self-pay | Admitting: Nurse Practitioner

## 2020-01-12 VITALS — BP 100/68 | Ht 63.0 in | Wt 170.0 lb

## 2020-01-12 DIAGNOSIS — Z01419 Encounter for gynecological examination (general) (routine) without abnormal findings: Secondary | ICD-10-CM | POA: Diagnosis not present

## 2020-01-12 DIAGNOSIS — D259 Leiomyoma of uterus, unspecified: Secondary | ICD-10-CM

## 2020-01-12 DIAGNOSIS — T8332XD Displacement of intrauterine contraceptive device, subsequent encounter: Secondary | ICD-10-CM

## 2020-01-12 NOTE — Progress Notes (Signed)
Samantha Willis [birth year] [id number]   History:  44 y.o. G 3P0032 presents as new patient to establish care. Mirena IUD inserted greater than 6 years ago. Removal was attempted by former provider but was left in place and told she could keep in place for 7 years. Ultrasound was performed at that time due to inability to find strings and was told device is under "tangerine-size fibroid". BTL. She wants to stop hormones and see what her cycles do for 2-3 months. If she decides to restart she would like OCPs. Abnormal pap > 20 years ago, benign biopsy. Mother and 2 aunts with breast cancer. T2DM, Graves disease managed elsewhere.   Gynecologic History Patient's last menstrual period was 01/10/2020. Period Cycle (Days): 28 Period Duration (Days): 5 Period Pattern: Regular Menstrual Flow: Moderate Menstrual Control: Maxi pad,Tampon Dysmenorrhea: (!) Mild Dysmenorrhea Symptoms: Cramping Contraception: IUD and tubal ligation Last Pap: 2 years ago. Results were: normal Last mammogram: 2019. Results were:  normal  Past medical history, past surgical history, family history and social history were all reviewed and documented in the EPIC chart.  ROS:  A ROS was performed and pertinent positives and negatives are included.  Exam:  Vitals:   01/12/20 1034  BP: 100/68  Weight: 170 lb (77.1 kg)  Height: 5\' 3"  (1.6 m)   Body mass index is 30.11 kg/m.  General appearance:  Normal Thyroid:  Symmetrical, normal in size, without palpable masses or nodularity. Respiratory  Auscultation:  Clear without wheezing or rhonchi Cardiovascular  Auscultation:  Regular rate, without rubs, murmurs or gallops  Edema/varicosities:  Not grossly evident Abdominal  Soft,nontender, without masses, guarding or rebound.  Liver/spleen:  No organomegaly noted  Hernia:  None appreciated  Skin  Inspection:  Grossly normal   Breasts: Examined lying and sitting.   Right: Without masses,  retractions, discharge or axillary adenopathy.   Left: Without masses, retractions, discharge or axillary adenopathy. Gentitourinary   Inguinal/mons:  Normal without inguinal adenopathy  External genitalia:  Normal  BUS/Urethra/Skene's glands:  Normal  Vagina:  Normal  Cervix:  Normal, IUD string not visualized  Uterus:  Normal in size, shape and contour.  Midline and mobile  Adnexa/parametria:     Rt: Without masses or tenderness.   Lt: Without masses or tenderness.  Anus and perineum: Normal  Assessment/Plan:  44 y.o. [id number] to establish care.   Well female exam with routine gynecological exam - Education provided on SBEs, importance of preventative screenings, current guidelines, high calcium diet, regular exercise, and multivitamin daily. Labs done elsewhere.   Intrauterine contraceptive device threads lost, subsequent encounter - IUD strings no visualized in cervical os. Removal was attempted by former provider March 2020 but was left in place and told she could keep in place for 7 years. Ultrasound was performed at that time due to inability to find strings and was told device is under "tangerine-sized fibroid".  We will schedule ultrasound for IUD removal with Dr. Delilah Shan.  Uterine leiomyoma - identified on ultrasound March 2020 per patient  Screening for cervical cancer -abnormal Pap greater than 20 years ago, benign biopsy.  Normal Pap smear 2 years ago per patient.  Will repeat at 5-year interval per guidelines.  Screening for breast cancer -normal mammogram history.  Overdue for mammogram and plans to schedule this soon.  Mother and 2 aunts with history of breast cancer.  Normal breast exam today.  Follow-up in 1 year for annual.     Samantha Willis [facility], 10:47 AM  01/12/2020  

## 2020-01-12 NOTE — Patient Instructions (Addendum)
Health Maintenance, Female Adopting a healthy lifestyle and getting preventive care are important in promoting health and wellness. Ask your health care provider about:  The right schedule for you to have regular tests and exams.  Things you can do on your own to prevent diseases and keep yourself healthy. What should I know about diet, weight, and exercise? Eat a healthy diet   Eat a diet that includes plenty of vegetables, fruits, low-fat dairy products, and lean protein.  Do not eat a lot of foods that are high in solid fats, added sugars, or sodium. Maintain a healthy weight Body mass index (BMI) is used to identify weight problems. It estimates body fat based on height and weight. Your health care provider can help determine your BMI and help you achieve or maintain a healthy weight. Get regular exercise Get regular exercise. This is one of the most important things you can do for your health. Most adults should:  Exercise for at least 150 minutes each week. The exercise should increase your heart rate and make you sweat (moderate-intensity exercise).  Do strengthening exercises at least twice a week. This is in addition to the moderate-intensity exercise.  Spend less time sitting. Even light physical activity can be beneficial. Watch cholesterol and blood lipids Have your blood tested for lipids and cholesterol at 43 years of age, then have this test every 5 years. Have your cholesterol levels checked more often if:  Your lipid or cholesterol levels are high.  You are older than 43 years of age.  You are at high risk for heart disease. What should I know about cancer screening? Depending on your health history and family history, you may need to have cancer screening at various ages. This may include screening for:  Breast cancer.  Cervical cancer.  Colorectal cancer.  Skin cancer.  Lung cancer. What should I know about heart disease, diabetes, and high blood  pressure? Blood pressure and heart disease  High blood pressure causes heart disease and increases the risk of stroke. This is more likely to develop in people who have high blood pressure readings, are of African descent, or are overweight.  Have your blood pressure checked: ? Every 3-5 years if you are 18-39 years of age. ? Every year if you are 40 years old or older. Diabetes Have regular diabetes screenings. This checks your fasting blood sugar level. Have the screening done:  Once every three years after age 40 if you are at a normal weight and have a low risk for diabetes.  More often and at a younger age if you are overweight or have a high risk for diabetes. What should I know about preventing infection? Hepatitis B If you have a higher risk for hepatitis B, you should be screened for this virus. Talk with your health care provider to find out if you are at risk for hepatitis B infection. Hepatitis C Testing is recommended for:  Everyone born from 1945 through 1965.  Anyone with known risk factors for hepatitis C. Sexually transmitted infections (STIs)  Get screened for STIs, including gonorrhea and chlamydia, if: ? You are sexually active and are younger than 43 years of age. ? You are older than 43 years of age and your health care provider tells you that you are at risk for this type of infection. ? Your sexual activity has changed since you were last screened, and you are at increased risk for chlamydia or gonorrhea. Ask your health care provider if   you are at risk.  Ask your health care provider about whether you are at high risk for HIV. Your health care provider may recommend a prescription medicine to help prevent HIV infection. If you choose to take medicine to prevent HIV, you should first get tested for HIV. You should then be tested every 3 months for as long as you are taking the medicine. Pregnancy  If you are about to stop having your period (premenopausal) and  you may become pregnant, seek counseling before you get pregnant.  Take 400 to 800 micrograms (mcg) of folic acid every day if you become pregnant.  Ask for birth control (contraception) if you want to prevent pregnancy. Osteoporosis and menopause Osteoporosis is a disease in which the bones lose minerals and strength with aging. This can result in bone fractures. If you are 65 years old or older, or if you are at risk for osteoporosis and fractures, ask your health care provider if you should:  Be screened for bone loss.  Take a calcium or vitamin D supplement to lower your risk of fractures.  Be given hormone replacement therapy (HRT) to treat symptoms of menopause. Follow these instructions at home: Lifestyle  Do not use any products that contain nicotine or tobacco, such as cigarettes, e-cigarettes, and chewing tobacco. If you need help quitting, ask your health care provider.  Do not use street drugs.  Do not share needles.  Ask your health care provider for help if you need support or information about quitting drugs. Alcohol use  Do not drink alcohol if: ? Your health care provider tells you not to drink. ? You are pregnant, may be pregnant, or are planning to become pregnant.  If you drink alcohol: ? Limit how much you use to 0-1 drink a day. ? Limit intake if you are breastfeeding.  Be aware of how much alcohol is in your drink. In the [place] one drink equals one 12 oz bottle of beer (355 mL), one 5 oz glass of wine (148 mL), or one 1 oz glass of hard liquor (44 mL). General instructions  Schedule regular health, dental, and eye exams.  Stay current with your vaccines.  Tell your health care provider if: ? You often feel depressed. ? You have ever been abused or do not feel safe at home. Summary  Adopting a healthy lifestyle and getting preventive care are important in promoting health and wellness.  Follow your health care provider's instructions about healthy  diet, exercising, and getting tested or screened for diseases.  Follow your health care provider's instructions on monitoring your cholesterol and blood pressure. This information is not intended to replace advice given to you by your health care provider. Make sure you discuss any questions you have with your health care provider. Document Revised: 01/07/2018 Document Reviewed: 01/07/2018 Elsevier Patient Education  2020 [employer]vier Inc.  

## 2020-02-08 ENCOUNTER — Telehealth: Payer: Self-pay | Admitting: Internal Medicine

## 2020-02-08 NOTE — Telephone Encounter (Signed)
Patient is calling and stated that she faxed over some FMLA paperwork and wanted to see if forms have been completed and faxed back, please advise. CB is [phone]

## 2020-02-09 NOTE — Telephone Encounter (Signed)
Left message on machine for patient to call back with more information as to why she needs more than 2 days a month for FLMA?

## 2020-02-09 NOTE — Telephone Encounter (Signed)
Pt is returning the call back to the office concerning her FMLA pw.

## 2020-02-10 ENCOUNTER — Other Ambulatory Visit: Payer: 59

## 2020-02-10 ENCOUNTER — Ambulatory Visit: Payer: 59 | Admitting: Obstetrics and Gynecology

## 2020-02-10 NOTE — Telephone Encounter (Signed)
Patient calling back and had some questions regarding paperwork, please advise. CB is [phone]

## 2020-02-10 NOTE — Telephone Encounter (Signed)
Spoke with patient and a virtual visit scheduled  

## 2020-02-11 ENCOUNTER — Other Ambulatory Visit: Payer: Self-pay

## 2020-02-11 ENCOUNTER — Telehealth (INDEPENDENT_AMBULATORY_CARE_PROVIDER_SITE_OTHER): Payer: 59 | Admitting: Internal Medicine

## 2020-02-11 DIAGNOSIS — Z794 Long term (current) use of insulin: Secondary | ICD-10-CM | POA: Diagnosis not present

## 2020-02-11 DIAGNOSIS — E1165 Type 2 diabetes mellitus with hyperglycemia: Secondary | ICD-10-CM

## 2020-02-11 DIAGNOSIS — F411 Generalized anxiety disorder: Secondary | ICD-10-CM

## 2020-02-11 DIAGNOSIS — R0981 Nasal congestion: Secondary | ICD-10-CM

## 2020-02-11 MED ORDER — SERTRALINE HCL 100 MG PO TABS: 100.0000 mg | ORAL_TABLET | Freq: Every day | ORAL | 1 refills | Status: DC

## 2020-02-11 NOTE — Progress Notes (Signed)
Virtual Visit via Telephone Note  I connected with Samantha Willis on 02/11/20 at  3:00 PM EST by telephone and verified that I am speaking with the correct person using two identifiers.   I discussed the limitations, risks, security and privacy concerns of performing an evaluation and management service by telephone and the availability of in person appointments. I also discussed with the patient that there may be a patient responsible charge related to this service. The patient expressed understanding and agreed to proceed.  Location patient: home Location provider: work office Participants present for the call: patient, provider Patient did not have a visit in the prior 7 days to address this/these issue(s).   History of Present Illness:  She has scheduled this visit to discuss multiple concerns:  1.She feels like she has sinus congestion.  She believes it is a sinus infection and is requesting antibiotic therapy.  2.Her anxiety has been worsening.  She is tearful throughout our conversation.  3.  She has been dealing with labile diabetes.  She tells me that at times her sugars will be above 300 and others will drop below 50.  Her most recent A1c was over 14 and that was upon diagnosis.  I have not seen her since.  She is unable to tell me what her diabetic regimen is.  4.  We had filled out intermittent FMLA papers for her in order for her to have 2 possible days off a month for her newly diagnosed diabetes for doctors visits.  We received paperwork from her job that she has missed work 16 times between November 18 and December 31.  Her job is requesting her FMLA be updated.  She states that she missed all these days due to headache.  She did not notify the office or schedule a visit.   Observations/Objective: Patient sounds anxious and tearful on the phone. I do not appreciate any increased work of breathing. Speech and thought processing are grossly intact. Patient  reported vitals: None reported   Current Outpatient Medications:  .  blood glucose meter kit and supplies, Dispense based on patient and insurance preference. Use up to four times daily as directed. (FOR ICD-10 E10.9, E11.9)., Disp: 1 each, Rfl: 0 .  Cholecalciferol (VITAMIN D3) 2000 UNITS TABS, Take 1 tablet by mouth daily., Disp: , Rfl:  .  Continuous Blood Gluc Sensor (FREESTYLE LIBRE 14 DAY SENSOR) MISC, Apply one sensor every 14 days., Disp: 1 each, Rfl: 12 .  diphenhydrAMINE (BENADRYL) 25 MG tablet, Take 25 mg by mouth every 6 (six) hours as needed., Disp: , Rfl:  .  ibuprofen (ADVIL,MOTRIN) 200 MG tablet, Take 200 mg by mouth every 8 (eight) hours as needed., Disp: , Rfl:  .  insulin aspart (NOVOLOG) 100 UNIT/ML injection, Inject 3 Units into the skin 3 (three) times daily with meals., Disp: 10 mL, Rfl: 0 .  Insulin Glargine (BASAGLAR KWIKPEN) 100 UNIT/ML, Inject 24 Units into the skin daily., Disp: 9 mL, Rfl: 0 .  Insulin Pen Needle 32G X 4 MM MISC, Use daily for glucose control, Disp: 100 each, Rfl: 3 .  Insulin Syringe-Needle U-100 27G X 1/2" 1 ML MISC, Use daily for glucose control.  DxE11.9, Disp: 100 each, Rfl: 3 .  sertraline (ZOLOFT) 100 MG tablet, Take 1 tablet (100 mg total) by mouth daily., Disp: 90 tablet, Rfl: 1  Review of Systems:  Constitutional: Denies fever, chills, diaphoresis, appetite change and fatigue.  HEENT: Denies photophobia, eye pain, redness,  hearing loss, ear pain,  mouth sores, trouble swallowing, neck pain, neck stiffness and tinnitus.   Respiratory: Denies SOB, DOE, cough, chest tightness,  and wheezing.   Cardiovascular: Denies chest pain, palpitations and leg swelling.  Gastrointestinal: Denies nausea, vomiting, abdominal pain, diarrhea, constipation, blood in stool and abdominal distention.  Genitourinary: Denies dysuria, urgency, frequency, hematuria, flank pain and difficulty urinating.  Endocrine: Denies: hot or cold intolerance, sweats, changes in  hair or nails, polyuria, polydipsia. Musculoskeletal: Denies myalgias, back pain, joint swelling, arthralgias and gait problem.  Skin: Denies pallor, rash and wound.  Neurological: Denies dizziness, seizures, syncope, weakness, light-headedness, numbness and headaches.  Hematological: Denies adenopathy. Easy bruising, personal or family bleeding history  Psychiatric/Behavioral: Denies suicidal ideation, , confusion, nervousness, sleep disturbance and agitation   Assessment and Plan:  Type 2 diabetes mellitus with hyperglycemia, with long-term current use of insulin ([place])  -Most recent A1c on file was 14.2 from her initial diagnosis. -Given she is describing labile diabetes and is currently on insulin, I believe referral to endocrinology is appropriate.  GAD (generalized anxiety disorder)  -She is visibly tearful, admits to significant anxiety and sleeping issues. -Increase Zoloft from 50 to 100 mg daily.  Have advised CBT sessions, she will consider.  Congestion of nasal sinus -Have advised COVID testing, she will notify us if test is positive.  I have advised her that I am unfortunately not able to retroactively justify her significant missed days from work as she did not call the office to inform us of her illness for evaluation by [place].    I discussed the assessment and treatment plan with the patient. The patient was provided an opportunity to ask questions and all were answered. The patient agreed with the plan and demonstrated an understanding of the instructions.   The patient was advised to call back or seek an in-person evaluation if the symptoms worsen or if the condition fails to improve as anticipated.  I provided 24 minutes of non-face-to-face time during this encounter.   Lelon Frohlich, MD [facility] at [facility]

## 2020-02-23 ENCOUNTER — Telehealth: Payer: Self-pay | Admitting: Internal Medicine

## 2020-02-23 NOTE — Telephone Encounter (Signed)
Dr Jerilee Hoh explained to the patient at her last office visit that she was unable to complete her FMLA.  Fax was also sent and confirmed x 2.

## 2020-02-23 NOTE — Telephone Encounter (Signed)
The patient called and wanted to know the status of her second FMLA forms that were sent by fax  She said her claim has been denied because the company did not receive the forms back and want to know if the forms were sent and want a call back from the nurse

## 2020-03-02 ENCOUNTER — Encounter: Payer: Self-pay | Admitting: Internal Medicine

## 2020-03-15 ENCOUNTER — Encounter: Payer: Self-pay | Admitting: Internal Medicine

## 2020-03-15 ENCOUNTER — Other Ambulatory Visit: Payer: Self-pay | Admitting: Internal Medicine

## 2020-03-15 ENCOUNTER — Ambulatory Visit (INDEPENDENT_AMBULATORY_CARE_PROVIDER_SITE_OTHER): Payer: 59 | Admitting: Internal Medicine

## 2020-03-15 ENCOUNTER — Other Ambulatory Visit: Payer: Self-pay

## 2020-03-15 VITALS — BP 124/84 | HR 111 | Temp 98.5°F | Wt 164.3 lb

## 2020-03-15 DIAGNOSIS — E1169 Type 2 diabetes mellitus with other specified complication: Secondary | ICD-10-CM

## 2020-03-15 DIAGNOSIS — E785 Hyperlipidemia, unspecified: Secondary | ICD-10-CM

## 2020-03-15 DIAGNOSIS — Z794 Long term (current) use of insulin: Secondary | ICD-10-CM | POA: Diagnosis not present

## 2020-03-15 DIAGNOSIS — E1165 Type 2 diabetes mellitus with hyperglycemia: Secondary | ICD-10-CM

## 2020-03-15 DIAGNOSIS — Z720 Tobacco use: Secondary | ICD-10-CM | POA: Diagnosis not present

## 2020-03-15 LAB — POCT GLYCOSYLATED HEMOGLOBIN (HGB A1C): Hemoglobin A1C: 8.3 % — AB (ref 4.0–5.6)

## 2020-03-15 LAB — CBC WITH DIFFERENTIAL/PLATELET
Basophils Absolute: 0 10*3/uL (ref 0.0–0.1)
Basophils Relative: 0.5 % (ref 0.0–3.0)
Eosinophils Absolute: 0 10*3/uL (ref 0.0–0.7)
Eosinophils Relative: 0.2 % (ref 0.0–5.0)
HCT: 40.7 % (ref 36.0–46.0)
Hemoglobin: 14.1 g/dL (ref 12.0–15.0)
Lymphocytes Relative: 27.7 % (ref 12.0–46.0)
Lymphs Abs: 1.9 10*3/uL (ref 0.7–4.0)
MCHC: 34.7 g/dL (ref 30.0–36.0)
MCV: 98.1 fl (ref 78.0–100.0)
Monocytes Absolute: 0.5 10*3/uL (ref 0.1–1.0)
Monocytes Relative: 7.2 % (ref 3.0–12.0)
Neutro Abs: 4.5 10*3/uL (ref 1.4–7.7)
Neutrophils Relative %: 64.4 % (ref 43.0–77.0)
Platelets: 290 10*3/uL (ref 150.0–400.0)
RBC: 4.15 Mil/uL (ref 3.87–5.11)
RDW: 13.6 % (ref 11.5–15.5)
WBC: 7 10*3/uL (ref 4.0–10.5)

## 2020-03-15 LAB — COMPREHENSIVE METABOLIC PANEL
ALT: 15 U/L (ref 0–35)
AST: 15 U/L (ref 0–37)
Albumin: 4.9 g/dL (ref 3.5–5.2)
Alkaline Phosphatase: 62 U/L (ref 39–117)
BUN: 7 mg/dL (ref 6–23)
CO2: 23 mEq/L (ref 19–32)
Calcium: 10.1 mg/dL (ref 8.4–10.5)
Chloride: 98 mEq/L (ref 96–112)
Creatinine, Ser: 0.83 mg/dL (ref 0.40–1.20)
GFR: 86.05 mL/min (ref >60.00)
Glucose, Bld: 309 mg/dL — ABNORMAL HIGH (ref 70–99)
Potassium: 4.5 mEq/L (ref 3.5–5.1)
Sodium: 133 mEq/L — ABNORMAL LOW (ref 135–145)
Total Bilirubin: 0.7 mg/dL (ref 0.2–1.2)
Total Protein: 8.4 g/dL — ABNORMAL HIGH (ref 6.0–8.3)

## 2020-03-15 LAB — LIPID PANEL
Cholesterol: 236 mg/dL — ABNORMAL HIGH (ref 0–200)
HDL: 85.3 mg/dL (ref >39.00)
LDL Cholesterol: 125 mg/dL — ABNORMAL HIGH (ref 0–99)
NonHDL: 151.13
Total CHOL/HDL Ratio: 3
Triglycerides: 133 mg/dL (ref 0.0–149.0)
VLDL: 26.6 mg/dL (ref 0.0–40.0)

## 2020-03-15 MED ORDER — TRULICITY 0.75 MG/0.5ML ~~LOC~~ SOAJ: 0.7500 mg | SUBCUTANEOUS | 2 refills | Status: DC

## 2020-03-15 MED ORDER — ATORVASTATIN CALCIUM 40 MG PO TABS: 40.0000 mg | ORAL_TABLET | Freq: Every day | ORAL | 1 refills | Status: DC

## 2020-03-15 MED ORDER — FREESTYLE LIBRE 14 DAY SENSOR MISC: 1.0000 | Freq: Every day | 2 refills | Status: AC

## 2020-03-15 MED ORDER — METFORMIN HCL 500 MG PO TABS: 500.0000 mg | ORAL_TABLET | Freq: Two times a day (BID) | ORAL | 1 refills | Status: DC

## 2020-03-15 MED ORDER — FREESTYLE LIBRE 14 DAY READER DEVI: 1.0000 | Freq: Every day | 2 refills | Status: DC

## 2020-03-15 MED ORDER — BASAGLAR KWIKPEN 100 UNIT/ML ~~LOC~~ SOPN: 10.0000 [IU] | PEN_INJECTOR | Freq: Every day | SUBCUTANEOUS | 0 refills | Status: DC

## 2020-03-15 NOTE — Patient Instructions (Signed)
-  Nice seeing you today!!  -Lab work today; will notify you once results are available.  -decrease basaglar to 10 units at bedtime.  -Stop taking 3 units of mealtime insulin.  -Start metformin 500 mg twice daily.  -Start trulicity 0.40 injected weekly.  -Schedule follow up in 3 months.

## 2020-03-15 NOTE — Progress Notes (Signed)
Established Patient Office Visit     This visit occurred during the SARS-CoV-2 public health emergency.  Safety protocols were in place, including screening questions prior to the visit, additional usage of staff PPE, and extensive cleaning of exam room while observing appropriate contact time as indicated for disinfecting solutions.    CC/Reason for Visit: Diabetic follow-up  HPI: Samantha Willis is a 45 y.o. female who is coming in today for the above mentioned reasons. Past Medical History is significant for: Newly diagnosed diabetes in November 2021 with an A1c of 14.2 upon diagnosis.  She was sent home from the hospital on a basal bolus regimen.  She has been doing Engineer, agricultural 24 units as well as rapid acting insulin 3 units with each meal.  She tells me that she rarely takes the [place] and has only been doing NovoLog with meals.  She also has a history of generalized anxiety disorder and is on sertraline.   Past Medical/Surgical History: Past Medical History:  Diagnosis Date  . Anxiety   . Carpal tunnel syndrome 05/06/2014    Bilateral  . Diabetic ketoacidosis ([place]) 11/17/2019  . GERD (gastroesophageal reflux disease)   . Graves disease    Graves Disease  . Heart palpitations   . Obesity     Past Surgical History:  Procedure Laterality Date  . Biopsy of Right Breast     At [place] Right 11/21/2015   Procedure: CARPAL TUNNEL RELEASE right;  Surgeon: Daryll Brod, MD;  Location: [facility];  Service: Orthopedics;  Laterality: Right;  FAB  . CARPAL TUNNEL RELEASE Left 06/11/2016   Procedure: LEFT CARPAL TUNNEL RELEASE;  Surgeon: Daryll Brod, MD;  Location: [facility];  Service: Orthopedics;  Laterality: Left;  REG/FAB  . CESAREAN SECTION    . TUBAL LIGATION  2011  . WISDOM TOOTH EXTRACTION      Social History:  reports that she quit smoking about 4 months ago. She smoked 0.25 packs per day. She  has never used smokeless tobacco. She reports current alcohol use. She reports that she does not use drugs.  Allergies: No Known Allergies  Family History:  Family History  Problem Relation Age of Onset  . Heart attack Mother   . Cancer Mother 62       breast  . Diabetes Mother        II  . Hypertension Father   . Hyperlipidemia Father   . Gout Father   . Healthy Sister   . Healthy Brother   . Healthy Brother      Current Outpatient Medications:  .  blood glucose meter kit and supplies, Dispense based on patient and insurance preference. Use up to four times daily as directed. (FOR ICD-10 E10.9, E11.9)., Disp: 1 each, Rfl: 0 .  Cholecalciferol (VITAMIN D3) 2000 UNITS TABS, Take 1 tablet by mouth daily., Disp: , Rfl:  .  Continuous Blood Gluc Sensor (FREESTYLE LIBRE 14 DAY SENSOR) MISC, Apply one sensor every 14 days., Disp: 1 each, Rfl: 12 .  diphenhydrAMINE (BENADRYL) 25 MG tablet, Take 25 mg by mouth every 6 (six) hours as needed., Disp: , Rfl:  .  Dulaglutide (TRULICITY) 1.57 WI/2.0BT SOPN, Inject 0.75 mg into the skin once a week., Disp: 2 mL, Rfl: 2 .  ibuprofen (ADVIL,MOTRIN) 200 MG tablet, Take 200 mg by mouth every 8 (eight) hours as needed., Disp: , Rfl:  .  Insulin Pen Needle 32G  X 4 MM MISC, Use daily for glucose control, Disp: 100 each, Rfl: 3 .  Insulin Syringe-Needle U-100 27G X 1/2" 1 ML MISC, Use daily for glucose control.  DxE11.9, Disp: 100 each, Rfl: 3 .  metFORMIN (GLUCOPHAGE) 500 MG tablet, Take 1 tablet (500 mg total) by mouth 2 (two) times daily with a meal., Disp: 180 tablet, Rfl: 1 .  sertraline (ZOLOFT) 100 MG tablet, Take 1 tablet (100 mg total) by mouth daily., Disp: 90 tablet, Rfl: 1 .  Insulin Glargine (BASAGLAR KWIKPEN) 100 UNIT/ML, Inject 10 Units into the skin at bedtime., Disp: 9 mL, Rfl: 0  Review of Systems:  Constitutional: Denies fever, chills, diaphoresis, appetite change and fatigue.  HEENT: Denies photophobia, eye pain, redness, hearing  loss, ear pain, congestion, sore throat, rhinorrhea, sneezing, mouth sores, trouble swallowing, neck pain, neck stiffness and tinnitus.   Respiratory: Denies SOB, DOE, cough, chest tightness,  and wheezing.   Cardiovascular: Denies chest pain, palpitations and leg swelling.  Gastrointestinal: Denies nausea, vomiting, abdominal pain, diarrhea, constipation, blood in stool and abdominal distention.  Genitourinary: Denies dysuria, urgency, frequency, hematuria, flank pain and difficulty urinating.  Endocrine: Denies: hot or cold intolerance, sweats, changes in hair or nails, polyuria, polydipsia. Musculoskeletal: Denies myalgias, back pain, joint swelling, arthralgias and gait problem.  Skin: Denies pallor, rash and wound.  Neurological: Denies dizziness, seizures, syncope, weakness, light-headedness, numbness and headaches.  Hematological: Denies adenopathy. Easy bruising, personal or family bleeding history  Psychiatric/Behavioral: Denies suicidal ideation, mood changes, confusion, nervousness, sleep disturbance and agitation    Physical Exam: Vitals:   03/15/20 1309  BP: 124/84  Pulse: (!) 111  Temp: 98.5 F (36.9 C)  TempSrc: Oral  SpO2: 97%  Weight: 164 lb 4.8 oz (74.5 kg)    Body mass index is 29.1 kg/m.   Constitutional: NAD, calm, comfortable Eyes: PERRL, lids and conjunctivae normal ENMT: Mucous membranes are moist.  Respiratory: clear to auscultation bilaterally, no wheezing, no crackles. Normal respiratory effort. No accessory muscle use.  Cardiovascular: Regular rate and rhythm, no murmurs / rubs / gallops. No extremity edema.  Neurologic: Grossly intact and nonfocal Psychiatric: Normal judgment and insight. Alert and oriented x 3. Normal mood.    Impression and Plan:  Type 2 diabetes mellitus with hyperglycemia, with long-term current use of insulin ([facility])  -A1c is much improved today down to 8.3 although still not at goal. -She appears to only be taking 9 units of  rapid acting insulin throughout the day. -I am hoping to take her completely off insulin. -Start Metformin 500 mg twice daily, start Trulicity 6.43 weekly. -I will bring down Basaglar from 24 to 10 units. -She will stop taking her rapid acting insulin with meals. -She tells me she has follow-up with endocrinology scheduled soon. -Check lipids today. -We have discussed need for eye care.    Patient Instructions  -Nice seeing you today!!  -Lab work today; will notify you once results are available.  -decrease basaglar to 10 units at bedtime.  -Stop taking 3 units of mealtime insulin.  -Start metformin 500 mg twice daily.  -Start trulicity 8.38 injected weekly.  -Schedule follow up in 3 months.     Lelon Frohlich, MD [facility] [facility] at [facility]

## 2020-03-15 NOTE — Addendum Note (Signed)
Addended by: Westley Hummer B on: 03/15/2020 02:03 PM   Modules accepted: Orders

## 2020-03-16 ENCOUNTER — Other Ambulatory Visit: Payer: Self-pay | Admitting: Internal Medicine

## 2020-03-16 DIAGNOSIS — E1169 Type 2 diabetes mellitus with other specified complication: Secondary | ICD-10-CM

## 2020-03-16 DIAGNOSIS — E785 Hyperlipidemia, unspecified: Secondary | ICD-10-CM

## 2020-03-22 ENCOUNTER — Telehealth: Payer: Self-pay | Admitting: *Deleted

## 2020-03-22 NOTE — Telephone Encounter (Signed)
Prior auth approved for Trulicity 9.43 Key: [id number] 03/22/20 -03/22/23

## 2020-03-27 ENCOUNTER — Other Ambulatory Visit: Payer: Self-pay | Admitting: Internal Medicine

## 2020-04-18 ENCOUNTER — Ambulatory Visit (INDEPENDENT_AMBULATORY_CARE_PROVIDER_SITE_OTHER): Payer: 59 | Admitting: Endocrinology

## 2020-04-18 ENCOUNTER — Other Ambulatory Visit: Payer: Self-pay

## 2020-04-18 VITALS — BP 120/60 | HR 99 | Ht 63.0 in | Wt 165.6 lb

## 2020-04-18 DIAGNOSIS — Z794 Long term (current) use of insulin: Secondary | ICD-10-CM | POA: Diagnosis not present

## 2020-04-18 DIAGNOSIS — E1165 Type 2 diabetes mellitus with hyperglycemia: Secondary | ICD-10-CM

## 2020-04-18 MED ORDER — TRULICITY 1.5 MG/0.5ML ~~LOC~~ SOAJ: 1.5000 mg | SUBCUTANEOUS | 3 refills | Status: DC

## 2020-04-18 MED ORDER — BASAGLAR KWIKPEN 100 UNIT/ML ~~LOC~~ SOPN: 5.0000 [IU] | PEN_INJECTOR | SUBCUTANEOUS | 0 refills | Status: DC

## 2020-04-18 NOTE — Progress Notes (Signed)
Subjective:    Patient ID: Samantha Willis, female    DOB: 1976-01-26, 45 y.o.   MRN: [id number]  HPI pt is referred by Dr Doreatha Lew, for diabetes.  Pt states DM was dx'ed in 2021; he is unaware of any chronic complications; she has been on insulin since dx; pt says her diet and exercise are good; she has never had GDM, pancreatitis, pancreatic surgery, or severe hypoglycemia.  She had DKA in 2021.  Basaglar has been reduced to 10 units qd.  She also takes Trulicity and metformin.  I reviewed continuous glucose monitor data, but data are minimal.  It is in general higher as the day goes on.   Past Medical History:  Diagnosis Date  . Anxiety   . Carpal tunnel syndrome 05/06/2014    Bilateral  . Diabetic ketoacidosis ([place]) 11/17/2019  . GERD (gastroesophageal reflux disease)   . Graves disease    Graves Disease  . Heart palpitations   . Obesity     Past Surgical History:  Procedure Laterality Date  . Biopsy of Right Breast     At [place] Right 11/21/2015   Procedure: CARPAL TUNNEL RELEASE right;  Surgeon: Daryll Brod, MD;  Location: [facility];  Service: Orthopedics;  Laterality: Right;  FAB  . CARPAL TUNNEL RELEASE Left 06/11/2016   Procedure: LEFT CARPAL TUNNEL RELEASE;  Surgeon: Daryll Brod, MD;  Location: [facility];  Service: Orthopedics;  Laterality: Left;  REG/FAB  . CESAREAN SECTION    . TUBAL LIGATION  2011  . WISDOM TOOTH EXTRACTION      Social History   Socioeconomic History  . Marital status: Married    Spouse name: Not on file  . Number of children: 2  . Years of education: some col.  . Highest education level: Not on file  Occupational History  . Occupation: unemployed  Tobacco Use  . Smoking status: Former Smoker    Packs/day: 0.25    Quit date: 11/10/2019    Years since quitting: 0.4  . Smokeless tobacco: Never Used  Vaping Use  . Vaping Use: Never used  Substance and Sexual Activity   . Alcohol use: Yes    Alcohol/week: 0.0 standard drinks    Comment: Occassionally  . Drug use: No  . Sexual activity: Yes    Birth control/protection: Surgical, I.U.D., None  Other Topics Concern  . Not on file  Social History Narrative   Lives at home w/ her fiance, son, brother and dad   Patient drinks 1 soda per week.   Patient is right handed   Social Determinants of Health   Financial Resource Strain: Not on file  Food Insecurity: Not on file  Transportation Needs: Not on file  Physical Activity: Not on file  Stress: Not on file  Social Connections: Not on file  Intimate Partner Violence: Not on file    Current Outpatient Medications on File Prior to Visit  Medication Sig Dispense Refill  . atorvastatin (LIPITOR) 40 MG tablet Take 1 tablet (40 mg total) by mouth daily. 90 tablet 1  . blood glucose meter kit and supplies Dispense based on patient and insurance preference. Use up to four times daily as directed. (FOR ICD-10 E10.9, E11.9). 1 each 0  . Cholecalciferol (VITAMIN D3) 2000 UNITS TABS Take 1 tablet by mouth daily.    . Continuous Blood Gluc Receiver (FREESTYLE LIBRE 14 DAY READER) DEVI 1 each by Does not apply  route daily. 1 each 2  . diphenhydrAMINE (BENADRYL) 25 MG tablet Take 25 mg by mouth every 6 (six) hours as needed.    Marland Kitchen ibuprofen (ADVIL,MOTRIN) 200 MG tablet Take 200 mg by mouth every 8 (eight) hours as needed.    . Insulin Pen Needle 32G X 4 MM MISC Use daily for glucose control 100 each 3  . Insulin Syringe-Needle U-100 27G X 1/2" 1 ML MISC Use daily for glucose control.  DxE11.9 100 each 3  . metFORMIN (GLUCOPHAGE) 500 MG tablet Take 1 tablet (500 mg total) by mouth 2 (two) times daily with a meal. 180 tablet 1  . sertraline (ZOLOFT) 100 MG tablet Take 1 tablet (100 mg total) by mouth daily. 90 tablet 1  . sertraline (ZOLOFT) 50 MG tablet TAKE 1 TABLET BY MOUTH EVERY DAY 90 tablet 1   No current facility-administered medications on file prior to visit.     No Known Allergies  Family History  Problem Relation Age of Onset  . Heart attack Mother   . Cancer Mother 62       breast  . Diabetes Mother        II  . Hypertension Father   . Hyperlipidemia Father   . Gout Father   . Healthy Sister   . Healthy Brother   . Healthy Brother     BP 120/60 (BP Location: Right Arm, Patient Position: Sitting, Cuff Size: Normal)   Pulse 99   Ht _0  (1.6 m)   Wt 165 lb 9.6 oz (75.1 kg)   SpO2 96%   BMI 29.33 kg/m     Review of Systems denies weight loss, chest pain, sob, n/v, urinary frequency, memory loss, and depression.     Objective:   Physical Exam VITAL SIGNS:  See vs page GENERAL: no distress Pulses: dorsalis pedis intact bilat.   MSK: no deformity of the feet CV: no leg edema Skin:  no ulcer on the feet.  normal color and temp on the feet. Neuro: sensation is intact to touch on the feet.    A1c=7.7%  Lab Results  Component Value Date   CREATININE 0.83 03/15/2020   BUN 7 03/15/2020   NA 133 (L) 03/15/2020   K 4.5 03/15/2020   CL 98 03/15/2020   CO2 23 03/15/2020    I have reviewed outside records, and summarized: Pt was noted to have elevated A1c, and referred here.  She was admitted with vulvar abscess, and was noted to be in DKA      Assessment & Plan:  Type 1 DM.  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  We discussed.  She wants to d/c insulin on a trial basis.   Patient Instructions  good diet and exercise significantly improve the control of your diabetes.  please let me know if you wish to be referred to a dietician.  high blood sugar is very risky to your health.  you should see an eye doctor and dentist every year.  It is very important to get all recommended vaccinations.  Controlling your blood pressure and cholesterol drastically reduces the damage diabetes does to your body.  Those who smoke should quit.  Please discuss these with your doctor.  check your blood sugar twice a day.  vary the  time of day when you check, between before the 3 meals, and at bedtime.  also check if you have symptoms of your blood sugar being too high or too low.  please keep a  record of the readings and bring it to your next appointment here (or you can bring the meter itself).  You can write it on any piece of paper.  please call us sooner if your blood sugar goes below 70, or if most of your readings are over 200.  Instead, you can use your continuous glucose monitor We will need to take this complex situation in stages For now, please reduce the Basaglar to 5 units per day, and increase the Trulicity.  Please continue the same metformin. As we are tapering off the insulin, there is some risk that the "ketoacidosis" condition you had last year could come back.  Therefore, you should keep insulin available, and call if your blood sugar stays over 200. Please come back for a follow-up appointment in 2 months.

## 2020-04-18 NOTE — Patient Instructions (Addendum)
good diet and exercise significantly improve the control of your diabetes.  please let me know if you wish to be referred to a dietician.  high blood sugar is very risky to your health.  you should see an eye doctor and dentist every year.  It is very important to get all recommended vaccinations.  Controlling your blood pressure and cholesterol drastically reduces the damage diabetes does to your body.  Those who smoke should quit.  Please discuss these with your doctor.  check your blood sugar twice a day.  vary the time of day when you check, between before the 3 meals, and at bedtime.  also check if you have symptoms of your blood sugar being too high or too low.  please keep a record of the readings and bring it to your next appointment here (or you can bring the meter itself).  You can write it on any piece of paper.  please call us sooner if your blood sugar goes below 70, or if most of your readings are over 200.  Instead, you can use your continuous glucose monitor We will need to take this complex situation in stages For now, please reduce the Basaglar to 5 units per day, and increase the Trulicity.  Please continue the same metformin. As we are tapering off the insulin, there is some risk that the "ketoacidosis" condition you had last year could come back.  Therefore, you should keep insulin available, and call if your blood sugar stays over 200. Please come back for a follow-up appointment in 2 months.

## 2020-05-03 ENCOUNTER — Telehealth: Payer: Self-pay

## 2020-05-03 NOTE — Telephone Encounter (Signed)
Not needed

## 2020-05-31 ENCOUNTER — Telehealth: Payer: Self-pay | Admitting: Internal Medicine

## 2020-05-31 NOTE — Telephone Encounter (Signed)
Pt calling to check the status of her FMLA  Forms [phone]

## 2020-05-31 NOTE — Telephone Encounter (Signed)
Placed in Dr Hernandez's folder 

## 2020-06-02 NOTE — Telephone Encounter (Signed)
Appointment scheduled to complete forms

## 2020-06-08 ENCOUNTER — Ambulatory Visit (INDEPENDENT_AMBULATORY_CARE_PROVIDER_SITE_OTHER): Payer: 59 | Admitting: Internal Medicine

## 2020-06-08 ENCOUNTER — Other Ambulatory Visit: Payer: Self-pay

## 2020-06-08 VITALS — BP 130/70 | HR 95 | Temp 98.8°F | Ht 63.0 in | Wt 158.8 lb

## 2020-06-08 DIAGNOSIS — Z794 Long term (current) use of insulin: Secondary | ICD-10-CM | POA: Diagnosis not present

## 2020-06-08 DIAGNOSIS — E1165 Type 2 diabetes mellitus with hyperglycemia: Secondary | ICD-10-CM | POA: Diagnosis not present

## 2020-06-08 LAB — POCT GLUCOSE (DEVICE FOR HOME USE): POC Glucose: 330 mg/dl — AB (ref 70–99)

## 2020-06-08 LAB — COMPREHENSIVE METABOLIC PANEL
ALT: 13 U/L (ref 0–35)
AST: 13 U/L (ref 0–37)
Albumin: 4.4 g/dL (ref 3.5–5.2)
Alkaline Phosphatase: 45 U/L (ref 39–117)
BUN: 7 mg/dL (ref 6–23)
CO2: 23 mEq/L (ref 19–32)
Calcium: 9.2 mg/dL (ref 8.4–10.5)
Chloride: 99 mEq/L (ref 96–112)
Creatinine, Ser: 0.74 mg/dL (ref 0.40–1.20)
GFR: 98.6 mL/min (ref >60.00)
Glucose, Bld: 346 mg/dL — ABNORMAL HIGH (ref 70–99)
Potassium: 3.5 mEq/L (ref 3.5–5.1)
Sodium: 133 mEq/L — ABNORMAL LOW (ref 135–145)
Total Bilirubin: 0.5 mg/dL (ref 0.2–1.2)
Total Protein: 7.4 g/dL (ref 6.0–8.3)

## 2020-06-08 LAB — CBC WITH DIFFERENTIAL/PLATELET
Basophils Absolute: 0.1 10*3/uL (ref 0.0–0.1)
Basophils Relative: 1.1 % (ref 0.0–3.0)
Eosinophils Absolute: 0 10*3/uL (ref 0.0–0.7)
Eosinophils Relative: 0.1 % (ref 0.0–5.0)
HCT: 37.7 % (ref 36.0–46.0)
Hemoglobin: 12.8 g/dL (ref 12.0–15.0)
Lymphocytes Relative: 30.6 % (ref 12.0–46.0)
Lymphs Abs: 2.7 10*3/uL (ref 0.7–4.0)
MCHC: 33.9 g/dL (ref 30.0–36.0)
MCV: 98.8 fl (ref 78.0–100.0)
Monocytes Absolute: 0.6 10*3/uL (ref 0.1–1.0)
Monocytes Relative: 6.8 % (ref 3.0–12.0)
Neutro Abs: 5.3 10*3/uL (ref 1.4–7.7)
Neutrophils Relative %: 61.4 % (ref 43.0–77.0)
Platelets: 289 10*3/uL (ref 150.0–400.0)
RBC: 3.82 Mil/uL — ABNORMAL LOW (ref 3.87–5.11)
RDW: 13.5 % (ref 11.5–15.5)
WBC: 8.7 10*3/uL (ref 4.0–10.5)

## 2020-06-08 LAB — POCT GLYCOSYLATED HEMOGLOBIN (HGB A1C): Hemoglobin A1C: 10.9 % — AB (ref 4.0–5.6)

## 2020-06-08 NOTE — Progress Notes (Signed)
Established Patient Office Visit     This visit occurred during the SARS-CoV-2 public health emergency.  Safety protocols were in place, including screening questions prior to the visit, additional usage of staff PPE, and extensive cleaning of exam room while observing appropriate contact time as indicated for disinfecting solutions.    CC/Reason for Visit: Requests FMLA, discuss acute concerns  HPI: Samantha Willis is a 45 y.o. female who is coming in today for the above mentioned reasons. Past Medical History is significant for: Diabetes, presumably type I.  She was diagnosed in December 2021.  She was on insulin but was referred to endocrinology.  Patient wanted to try and go off insulin so she did in March.  She is now on Trulicity, metformin.  She is scheduled to go back to see her endocrinologist next week.  For the past 10 days she has noticed sugars consistently in the 300-400 range.  She has been extremely fatigued, thirsty, nauseous and diaphoretic.  She is requesting intermittent FMLA for 2 days a month due to her diabetes and doctors appointments.   Past Medical/Surgical History: Past Medical History:  Diagnosis Date  . Anxiety   . Carpal tunnel syndrome 05/06/2014    Bilateral  . Diabetic ketoacidosis ([place]) 11/17/2019  . GERD (gastroesophageal reflux disease)   . Graves disease    Graves Disease  . Heart palpitations   . Obesity     Past Surgical History:  Procedure Laterality Date  . Biopsy of Right Breast     At [place] Right 11/21/2015   Procedure: CARPAL TUNNEL RELEASE right;  Surgeon: Daryll Brod, MD;  Location: [place];  Service: Orthopedics;  Laterality: Right;  FAB  . CARPAL TUNNEL RELEASE Left 06/11/2016   Procedure: LEFT CARPAL TUNNEL RELEASE;  Surgeon: Daryll Brod, MD;  Location: [place];  Service: Orthopedics;  Laterality: Left;  REG/FAB  . CESAREAN SECTION    . TUBAL  LIGATION  2011  . WISDOM TOOTH EXTRACTION      Social History:  reports that she quit smoking about 6 months ago. She smoked 0.25 packs per day. She has never used smokeless tobacco. She reports current alcohol use. She reports that she does not use drugs.  Allergies: No Known Allergies  Family History:  Family History  Problem Relation Age of Onset  . Heart attack Mother   . Cancer Mother 44       breast  . Diabetes Mother        II  . Hypertension Father   . Hyperlipidemia Father   . Gout Father   . Healthy Sister   . Healthy Brother   . Healthy Brother      Current Outpatient Medications:  .  atorvastatin (LIPITOR) 40 MG tablet, Take 1 tablet (40 mg total) by mouth daily., Disp: 90 tablet, Rfl: 1 .  blood glucose meter kit and supplies, Dispense based on patient and insurance preference. Use up to four times daily as directed. (FOR ICD-10 E10.9, E11.9)., Disp: 1 each, Rfl: 0 .  Cholecalciferol (VITAMIN D3) 2000 UNITS TABS, Take 1 tablet by mouth daily., Disp: , Rfl:  .  diphenhydrAMINE (BENADRYL) 25 MG tablet, Take 25 mg by mouth every 6 (six) hours as needed., Disp: , Rfl:  .  Dulaglutide (TRULICITY) 1.5 AQ/7.6AU SOPN, Inject 1.5 mg into the skin once a week., Disp: 6 mL, Rfl: 3 .  ibuprofen (ADVIL,MOTRIN) 200 MG  tablet, Take 200 mg by mouth every 8 (eight) hours as needed., Disp: , Rfl:  .  insulin aspart (NOVOLOG) 100 UNIT/ML FlexPen, INJECT 3 UNITS INTO THE SKIN THREE TIMES DAILY WITH MEALS., Disp: 3 mL, Rfl: 0 .  Insulin Glargine (BASAGLAR KWIKPEN) 100 UNIT/ML, Inject 5 Units into the skin every morning., Disp: 9 mL, Rfl: 0 .  Insulin Pen Needle 32G X 4 MM MISC, Use daily for glucose control, Disp: 100 each, Rfl: 3 .  Insulin Pen Needle 32G X 4 MM MISC, USE AS DIRECTED, Disp: 100 each, Rfl: 0 .  Insulin Syringe-Needle U-100 27G X 1/2" 1 ML MISC, Use daily for glucose control.  DxE11.9, Disp: 100 each, Rfl: 3 .  metFORMIN (GLUCOPHAGE) 500 MG tablet, Take 1 tablet (500 mg  total) by mouth 2 (two) times daily with a meal., Disp: 180 tablet, Rfl: 1 .  sertraline (ZOLOFT) 100 MG tablet, Take 1 tablet (100 mg total) by mouth daily., Disp: 90 tablet, Rfl: 1 .  sertraline (ZOLOFT) 50 MG tablet, TAKE 1 TABLET BY MOUTH EVERY DAY, Disp: 90 tablet, Rfl: 1 .  sulfamethoxazole-trimethoprim (BACTRIM DS) 800-160 MG tablet, TAKE 1 TABLET BY MOUTH TWO TIMES DAILY FOR 7 DAYS. (Patient taking differently: Take 1 tablet by mouth 2 (two) times daily. for 7 days), Disp: 14 tablet, Rfl: 0 .  Continuous Blood Gluc Receiver (FREESTYLE LIBRE 14 DAY READER) DEVI, 1 each by Does not apply route daily. (Patient not taking: Reported on 06/08/2020), Disp: 1 each, Rfl: 2  Review of Systems:  Constitutional: Denies fever, chills. HEENT: Denies photophobia, eye pain, redness, hearing loss, ear pain, congestion, sore throat, rhinorrhea, sneezing, mouth sores, trouble swallowing, neck pain, neck stiffness and tinnitus.   Respiratory: Denies SOB, DOE, cough, chest tightness,  and wheezing.   Cardiovascular: Denies chest pain, palpitations and leg swelling.  Gastrointestinal: Denies nausea, vomiting, abdominal pain, diarrhea, constipation, blood in stool and abdominal distention.  Genitourinary: Denies dysuria, urgency, frequency, hematuria, flank pain and difficulty urinating.  Endocrine: Denies: hot or cold intolerance, sweats, changes in hair or nails, polyuria, polydipsia. Musculoskeletal: Denies myalgias, back pain, joint swelling, arthralgias and gait problem.  Skin: Denies pallor, rash and wound.  Neurological: Denies dizziness, seizures, syncope, weakness, light-headedness, numbness and headaches.  Hematological: Denies adenopathy. Easy bruising, personal or family bleeding history  Psychiatric/Behavioral: Denies suicidal ideation, mood changes, confusion, nervousness, sleep disturbance and agitation    Physical Exam: Vitals:   06/08/20 1436  BP: 130/70  Pulse: 95  Temp: 98.8 F (37.1  C)  TempSrc: Oral  SpO2: 96%  Weight: 158 lb 12.8 oz (72 kg)  Height: '5\' 3"'  (1.6 m)    Body mass index is 28.13 kg/m.   Constitutional: NAD, calm, comfortable Eyes: PERRL, lids and conjunctivae normal ENMT: Mucous membranes are moist.  Respiratory: clear to auscultation bilaterally, no wheezing, no crackles. Normal respiratory effort. No accessory muscle use.  Cardiovascular: Regular rate and rhythm, no murmurs / rubs / gallops. No extremity edema. Neurologic: Grossly intact and nonfocal Psychiatric: Normal judgment and insight. Alert and oriented x 3. Normal mood.    Impression and Plan:  Type 2 diabetes mellitus with hyperglycemia, with long-term current use of insulin ([place])  -I am concerned that she might again be in DKA given her symptoms and lack of insulin and elevated CBGs. -CBG in office today was 333. -I will order stat chemistries.  She has been instructed to go back on her insulin as she was before. -If chemistries are consistent  with DKA, she will need to be evaluated in the emergency department tonight, otherwise okay to wait to see her endocrinologist next week as long as she is doing her insulin injections appropriately. -Her A1c has increased from 8.3 in February to 10.9 today  I will fill out her FMLA for 2 days a month.  Time spent: 32 minutes reviewing chart, interviewing and examining patient and formulating plan of care.   Patient Instructions  -Nice seeing you today!!  -Lab work today; will notify you once results are available.  -If you are in DKA, we will ask you to go to the hospital tonight.     Lelon Frohlich, MD [facility]

## 2020-06-08 NOTE — Patient Instructions (Signed)
-  Nice seeing you today!!  -Lab work today; will notify you once results are available.  -If you are in DKA, we will ask you to go to the hospital tonight.

## 2020-06-09 ENCOUNTER — Telehealth: Payer: Self-pay | Admitting: Internal Medicine

## 2020-06-09 NOTE — Telephone Encounter (Signed)
Pt is calling to get her lab results

## 2020-06-09 NOTE — Telephone Encounter (Signed)
Labs reviewed with patient.

## 2020-06-13 ENCOUNTER — Ambulatory Visit (INDEPENDENT_AMBULATORY_CARE_PROVIDER_SITE_OTHER): Payer: 59 | Admitting: Obstetrics & Gynecology

## 2020-06-13 ENCOUNTER — Ambulatory Visit (INDEPENDENT_AMBULATORY_CARE_PROVIDER_SITE_OTHER): Payer: 59

## 2020-06-13 ENCOUNTER — Other Ambulatory Visit: Payer: Self-pay

## 2020-06-13 ENCOUNTER — Encounter: Payer: Self-pay | Admitting: Obstetrics & Gynecology

## 2020-06-13 DIAGNOSIS — T8332XA Displacement of intrauterine contraceptive device, initial encounter: Secondary | ICD-10-CM

## 2020-06-13 DIAGNOSIS — T8332XS Displacement of intrauterine contraceptive device, sequela: Secondary | ICD-10-CM

## 2020-06-13 DIAGNOSIS — D259 Leiomyoma of uterus, unspecified: Secondary | ICD-10-CM | POA: Diagnosis not present

## 2020-06-13 DIAGNOSIS — Z30432 Encounter for removal of intrauterine contraceptive device: Secondary | ICD-10-CM

## 2020-06-13 DIAGNOSIS — T8332XD Displacement of intrauterine contraceptive device, subsequent encounter: Secondary | ICD-10-CM

## 2020-06-13 NOTE — Progress Notes (Signed)
    Samantha Willis 1975-02-16 [id number]        45 y.o.  [id number] S/P TL.  RP: IUD strings lost/previous unsuccessful attempt to remove the IUD  HPI: Mirena IUD overdue for removal. IUD strings lost/previous unsuccessful attempt to remove the IUD.  H/O Fibroids.   OB History  Gravida Para Term Preterm AB Living  3       3 2   SAB IAB Ectopic Multiple Live Births  0   3        # Outcome Date GA Lbr Len/2nd Weight Sex Delivery Anes PTL Lv  3 Ectopic           2 Ectopic           1 Ectopic             Past medical history,surgical history, problem list, medications, allergies, family history and social history were all reviewed and documented in the EPIC chart.   Directed ROS with pertinent positives and negatives documented in the history of present illness/assessment and plan.  Exam:  There were no vitals filed for this visit. General appearance:  Normal  Pelvic US today: T/V images.  Retroverted uterus with 3 intramural fibroids.  The overall uterine size is measured at 9.2 x 8.17 x 8.42 cm.  3 intramural fibroids are noted the largest of which is measured at 6.1 cm anteriorly.  1 subserosal fibroid is measured at 2.7 cm.  The IUD is noted centrally within the endometrium.  The endometrial lining is measured at 4.51 mm.  Both ovaries are normal in size with normal follicular pattern.  No adnexal masses present.  No free fluid in the posterior cul-de-sac.  Verbal consent obtained for Mirena IUD removal under [place]: Speculum inserted in the vagina.  Betadine prep.  Hurricane spray.  IUD strings not visible at the EO.  Attempt to retrieve the IUD with the small IUD clamp in the endocervix.  Under US guidance, IUD visualized posterior to the fibroid in the IUD cavity and grasped with the IUD clamp.  IUD easily removed intact and complete under ultrasound guidance.  IUD shown to patient and discarded.  Speculum removed.  Procedure well tolerated by patient without  complication.    Assessment/Plan:  45 y.o. [id number]   1. Intrauterine contraceptive device threads lost, sequela IUD in place for more than 5 years.  Loss IUD strings.  Previous unsuccessful attempt to remove the IUD.  Successful IUD removal under ultrasound guidance today.  Procedure well-tolerated with no complication.  2. Encounter for IUD removal Successful IUD removal under ultrasound guidance.  IUD complete and intact.  Postprocedure precautions reviewed.  Patient is status post tubal sterilization.  3. Uterine leiomyoma, unspecified location Intramural fibroids x3 and 1 subserosal fibroid.  Will observe.  Princess Bruins MD, 8:48 PM 06/13/2020

## 2020-06-14 ENCOUNTER — Ambulatory Visit: Payer: 59 | Admitting: Internal Medicine

## 2020-06-21 ENCOUNTER — Ambulatory Visit (INDEPENDENT_AMBULATORY_CARE_PROVIDER_SITE_OTHER): Payer: 59 | Admitting: Endocrinology

## 2020-06-21 ENCOUNTER — Other Ambulatory Visit: Payer: Self-pay

## 2020-06-21 VITALS — BP 130/78 | HR 100 | Ht 63.0 in | Wt 162.6 lb

## 2020-06-21 DIAGNOSIS — Z794 Long term (current) use of insulin: Secondary | ICD-10-CM

## 2020-06-21 DIAGNOSIS — E1165 Type 2 diabetes mellitus with hyperglycemia: Secondary | ICD-10-CM | POA: Diagnosis not present

## 2020-06-21 LAB — POCT GLYCOSYLATED HEMOGLOBIN (HGB A1C): Hemoglobin A1C: 11.1 % — AB (ref 4.0–5.6)

## 2020-06-21 MED ORDER — INSULIN ASPART 100 UNIT/ML FLEXPEN: 4.0000 [IU] | PEN_INJECTOR | Freq: Three times a day (TID) | SUBCUTANEOUS | 3 refills | Status: DC

## 2020-06-21 MED ORDER — BASAGLAR KWIKPEN 100 UNIT/ML ~~LOC~~ SOPN: 5.0000 [IU] | PEN_INJECTOR | SUBCUTANEOUS | 3 refills | Status: DC

## 2020-06-21 NOTE — Progress Notes (Signed)
Subjective:    Patient ID: Samantha Willis, female    DOB: 07-18-1975, 44 y.o.   MRN: [id number]  HPI Pt returns for f/u of diabetes mellitus: DM type: 1 Dx'ed: 4270 Complications: PN Therapy: insulin since dx GDM: never DKA: 2021 Severe hypoglycemia: never Pancreatitis: never Pancreatic imaging: normal on 2021 CT SDOH: none Other: she takes multiple daily injections; she is intermittently ketosis prone Interval history: She was seen with cbg in the 300's, so basaglar was resumed at 5 units qd, and Novolog at 3 units 3 times a day (just before each meal).  On this, cbg's varied from 134-330.  It was in general higher as the day goes on.  4 days ago, she ran out of Novolog, but she still takes [employer], [employer].   Past Medical History:  Diagnosis Date  . Anxiety   . Carpal tunnel syndrome 05/06/2014    Bilateral  . Diabetic ketoacidosis ([place]) 11/17/2019  . GERD (gastroesophageal reflux disease)   . Graves disease    Graves Disease  . Heart palpitations   . Obesity     Past Surgical History:  Procedure Laterality Date  . Biopsy of Right Breast     At [place] Right 11/21/2015   Procedure: CARPAL TUNNEL RELEASE right;  Surgeon: Daryll Brod, MD;  Location: [place];  Service: Orthopedics;  Laterality: Right;  FAB  . CARPAL TUNNEL RELEASE Left 06/11/2016   Procedure: LEFT CARPAL TUNNEL RELEASE;  Surgeon: Daryll Brod, MD;  Location: [place];  Service: Orthopedics;  Laterality: Left;  REG/FAB  . CESAREAN SECTION    . TUBAL LIGATION  2011  . WISDOM TOOTH EXTRACTION      Social History   Socioeconomic History  . Marital status: Married    Spouse name: Not on file  . Number of children: 2  . Years of education: some col.  . Highest education level: Not on file  Occupational History  . Occupation: unemployed  Tobacco Use  . Smoking status: Former Smoker    Packs/day: 0.25    Quit date: 11/10/2019     Years since quitting: 0.6  . Smokeless tobacco: Never Used  Vaping Use  . Vaping Use: Never used  Substance and Sexual Activity  . Alcohol use: Yes    Alcohol/week: 0.0 standard drinks    Comment: Occassionally  . Drug use: No  . Sexual activity: Yes    Birth control/protection: Surgical, I.U.D., None  Other Topics Concern  . Not on file  Social History Narrative   Lives at home w/ her fiance, son, brother and dad   Patient drinks 1 soda per week.   Patient is right handed   Social Determinants of Health   Financial Resource Strain: Not on file  Food Insecurity: Not on file  Transportation Needs: Not on file  Physical Activity: Not on file  Stress: Not on file  Social Connections: Not on file  Intimate Partner Violence: Not on file    Current Outpatient Medications on File Prior to Visit  Medication Sig Dispense Refill  . atorvastatin (LIPITOR) 40 MG tablet Take 1 tablet (40 mg total) by mouth daily. 90 tablet 1  . blood glucose meter kit and supplies Dispense based on patient and insurance preference. Use up to four times daily as directed. (FOR ICD-10 E10.9, E11.9). 1 each 0  . Cholecalciferol (VITAMIN D3) 2000 UNITS TABS Take 1 tablet by mouth daily.    Marland Kitchen  Continuous Blood Gluc Receiver (FREESTYLE LIBRE 14 DAY READER) DEVI 1 each by Does not apply route daily. 1 each 2  . diphenhydrAMINE (BENADRYL) 25 MG tablet Take 25 mg by mouth every 6 (six) hours as needed.    . Dulaglutide (TRULICITY) 1.5 QP/6.1PJ SOPN Inject 1.5 mg into the skin once a week. 6 mL 3  . ibuprofen (ADVIL,MOTRIN) 200 MG tablet Take 200 mg by mouth every 8 (eight) hours as needed.    . Insulin Pen Needle 32G X 4 MM MISC Use daily for glucose control 100 each 3  . Insulin Syringe-Needle U-100 27G X 1/2" 1 ML MISC Use daily for glucose control.  DxE11.9 100 each 3  . metFORMIN (GLUCOPHAGE) 500 MG tablet Take 1 tablet (500 mg total) by mouth 2 (two) times daily with a meal. 180 tablet 1  . sertraline (ZOLOFT)  100 MG tablet Take 1 tablet (100 mg total) by mouth daily. 90 tablet 1  . sertraline (ZOLOFT) 50 MG tablet TAKE 1 TABLET BY MOUTH EVERY DAY 90 tablet 1   No current facility-administered medications on file prior to visit.    No Known Allergies  Family History  Problem Relation Age of Onset  . Heart attack Mother   . Cancer Mother 33       breast  . Diabetes Mother        II  . Hypertension Father   . Hyperlipidemia Father   . Gout Father   . Healthy Sister   . Healthy Brother   . Healthy Brother     BP 130/78 (BP Location: Right Arm, Patient Position: Sitting, Cuff Size: Normal)   Pulse 100   Ht '5\' 3"'  (1.6 m)   Wt 162 lb 9.6 oz (73.8 kg)   SpO2 94%   BMI 28.80 kg/m    Review of Systems     Objective:   Physical Exam VITAL SIGNS:  See vs page GENERAL: no distress Pulses: dorsalis pedis intact bilat.   MSK: no deformity of the feet CV: no leg edema Skin:  no ulcer on the feet.  normal color and temp on the feet. Neuro: sensation is intact to touch on the feet.    Lab Results  Component Value Date   HGBA1C 11.1 (A) 06/21/2020       Assessment & Plan:  Type 1 DM: uncontrolled.  Trial of reducing insulin was unsuccessful.    Patient Instructions  check your blood sugar twice a day.  vary the time of day when you check, between before the 3 meals, and at bedtime.  also check if you have symptoms of your blood sugar being too high or too low.  please keep a record of the readings and bring it to your next appointment here (or you can bring the meter itself).  You can write it on any piece of paper.  please call us sooner if your blood sugar goes below 70, or if most of your readings are over 200.   We will need to take this complex situation in stages.  We need to give up on going off the insulin.    For now, please continue the same Basaglar: 5 units per day, and increase the Novolog to 4 units 3 times a day (just before each meal) Please continue the same  other medications.   Please come back for a follow-up appointment in 2 months.

## 2020-06-21 NOTE — Patient Instructions (Addendum)
check your blood sugar twice a day.  vary the time of day when you check, between before the 3 meals, and at bedtime.  also check if you have symptoms of your blood sugar being too high or too low.  please keep a record of the readings and bring it to your next appointment here (or you can bring the meter itself).  You can write it on any piece of paper.  please call us sooner if your blood sugar goes below 70, or if most of your readings are over 200.   We will need to take this complex situation in stages.  We need to give up on going off the insulin.    For now, please continue the same Basaglar: 5 units per day, and increase the Novolog to 4 units 3 times a day (just before each meal) Please continue the same other medications.   Please come back for a follow-up appointment in 2 months.

## 2020-09-21 ENCOUNTER — Other Ambulatory Visit: Payer: Self-pay

## 2020-09-21 ENCOUNTER — Ambulatory Visit (INDEPENDENT_AMBULATORY_CARE_PROVIDER_SITE_OTHER): Payer: 59 | Admitting: Endocrinology

## 2020-09-21 VITALS — BP 126/78 | HR 82 | Ht 63.0 in | Wt 161.6 lb

## 2020-09-21 DIAGNOSIS — E1165 Type 2 diabetes mellitus with hyperglycemia: Secondary | ICD-10-CM | POA: Diagnosis not present

## 2020-09-21 DIAGNOSIS — Z794 Long term (current) use of insulin: Secondary | ICD-10-CM | POA: Diagnosis not present

## 2020-09-21 LAB — POCT GLYCOSYLATED HEMOGLOBIN (HGB A1C): Hemoglobin A1C: 7.3 % — AB (ref 4.0–5.6)

## 2020-09-21 MED ORDER — FREESTYLE LIBRE 2 SENSOR MISC: 1.0000 | 3 refills | Status: DC

## 2020-09-21 MED ORDER — FREESTYLE LIBRE 2 READER DEVI: 1.0000 | Freq: Once | 1 refills | Status: AC

## 2020-09-21 MED ORDER — INSULIN ASPART 100 UNIT/ML FLEXPEN: PEN_INJECTOR | SUBCUTANEOUS | 3 refills | Status: DC

## 2020-09-21 NOTE — Patient Instructions (Addendum)
check your blood sugar twice a day.  vary the time of day when you check, between before the 3 meals, and at bedtime.  also check if you have symptoms of your blood sugar being too high or too low.  please keep a record of the readings and bring it to your next appointment here (or you can bring the meter itself).  You can write it on any piece of paper.  please call us sooner if your blood sugar goes below 70, or if most of your readings are over 200.    For now, please continue the same Basaglar: 5 units per day, and change the Novolog to 3 times a day (just before each meal), 3-4-5 units Please continue the same other medications.  Please Vaughan Basta, to consider an insulin pump. I have sent a prescription to your pharmacy, for the continuous glucose monitor.  Please come back for a follow-up appointment in 3 months.

## 2020-09-21 NOTE — Progress Notes (Signed)
Subjective:    Patient ID: Samantha Willis, female    DOB: 1975/07/29, 45 y.o.   MRN: [id number]  HPI Pt returns for f/u of diabetes mellitus: DM type: 1 (intermittently ketosis prone) Dx'ed: 3159 Complications: PN Therapy: insulin since dx, metformin, and Trulicity GDM: never DKA: 2021 Severe hypoglycemia: never Pancreatitis: never Pancreatic imaging: normal on 2021 CT SDOH: none Other: she takes multiple daily injections; she is intermittently ketosis prone Interval history: She has mild hypoglycemia approx once per week.  This happens before lunch.  no cbg record, but states cbg's vary from 75-263.  It is in general highest at HS.  She takes meds as rx'ed.  Past Medical History:  Diagnosis Date   Anxiety    Carpal tunnel syndrome 05/06/2014    Bilateral   Diabetic ketoacidosis ([place]) 11/17/2019   GERD (gastroesophageal reflux disease)    Graves disease    Graves Disease   Heart palpitations    Obesity     Past Surgical History:  Procedure Laterality Date   Biopsy of Right Breast     At [place] Right 11/21/2015   Procedure: CARPAL TUNNEL RELEASE right;  Surgeon: Daryll Brod, MD;  Location: [place];  Service: Orthopedics;  Laterality: Right;  FAB   CARPAL TUNNEL RELEASE Left 06/11/2016   Procedure: LEFT CARPAL TUNNEL RELEASE;  Surgeon: Daryll Brod, MD;  Location: [place];  Service: Orthopedics;  Laterality: Left;  REG/FAB   CESAREAN SECTION     TUBAL LIGATION  2011   WISDOM TOOTH EXTRACTION      Social History   Socioeconomic History   Marital status: Married    Spouse name: Not on file   Number of children: 2   Years of education: some col.   Highest education level: Not on file  Occupational History   Occupation: unemployed  Tobacco Use   Smoking status: Former    Packs/day: 0.25    Types: Cigarettes    Quit date: 11/10/2019    Years since quitting: 0.8   Smokeless tobacco:  Never  Vaping Use   Vaping Use: Never used  Substance and Sexual Activity   Alcohol use: Yes    Alcohol/week: 0.0 standard drinks    Comment: Occassionally   Drug use: No   Sexual activity: Yes    Birth control/protection: Surgical, I.U.D., None  Other Topics Concern   Not on file  Social History Narrative   Lives at home w/ her fiance, son, brother and dad   Patient drinks 1 soda per week.   Patient is right handed   Social Determinants of Health   Financial Resource Strain: Not on file  Food Insecurity: Not on file  Transportation Needs: Not on file  Physical Activity: Not on file  Stress: Not on file  Social Connections: Not on file  Intimate Partner Violence: Not on file    Current Outpatient Medications on File Prior to Visit  Medication Sig Dispense Refill   atorvastatin (LIPITOR) 40 MG tablet Take 1 tablet (40 mg total) by mouth daily. 90 tablet 1   blood glucose meter kit and supplies Dispense based on patient and insurance preference. Use up to four times daily as directed. (FOR ICD-10 E10.9, E11.9). 1 each 0   Cholecalciferol (VITAMIN D3) 2000 UNITS TABS Take 1 tablet by mouth daily.     Continuous Blood Gluc Receiver (FREESTYLE LIBRE 14 DAY READER) DEVI 1 each by Does not apply  route daily. 1 each 2   diphenhydrAMINE (BENADRYL) 25 MG tablet Take 25 mg by mouth every 6 (six) hours as needed.     Dulaglutide (TRULICITY) 1.5 PJ/1.2TK SOPN Inject 1.5 mg into the skin once a week. 6 mL 3   ibuprofen (ADVIL,MOTRIN) 200 MG tablet Take 200 mg by mouth every 8 (eight) hours as needed.     Insulin Glargine (BASAGLAR KWIKPEN) 100 UNIT/ML Inject 5 Units into the skin every morning. 15 mL 3   Insulin Pen Needle 32G X 4 MM MISC Use daily for glucose control 100 each 3   Insulin Syringe-Needle U-100 27G X 1/2" 1 ML MISC Use daily for glucose control.  DxE11.9 100 each 3   metFORMIN (GLUCOPHAGE) 500 MG tablet Take 1 tablet (500 mg total) by mouth 2 (two) times daily with a meal.  180 tablet 1   sertraline (ZOLOFT) 100 MG tablet Take 1 tablet (100 mg total) by mouth daily. 90 tablet 1   sertraline (ZOLOFT) 50 MG tablet TAKE 1 TABLET BY MOUTH EVERY DAY 90 tablet 1   No current facility-administered medications on file prior to visit.    No Known Allergies  Family History  Problem Relation Age of Onset   Heart attack Mother    Cancer Mother 81       breast   Diabetes Mother        II   Hypertension Father    Hyperlipidemia Father    Gout Father    Healthy Sister    Healthy Brother    Healthy Brother     BP 126/78 (BP Location: Right Arm, Patient Position: Sitting, Cuff Size: Normal)   Pulse 82   Ht '5\' 3"'  (1.6 m)   Wt 161 lb 9.6 oz (73.3 kg)   SpO2 96%   BMI 28.63 kg/m   Review of Systems     Objective:   Physical Exam Pulses: dorsalis pedis intact bilat.   MSK: no deformity of the feet CV: no leg edema Skin:  no ulcer on the feet.  normal color and temp on the feet. Neuro: sensation is intact to touch on the feet   Lab Results  Component Value Date   HGBA1C 7.3 (A) 09/21/2020      Assessment & Plan:  Type 1 DM. Hypoglycemia, due to insulin: this limits aggressiveness of glycemic control.    Patient Instructions  check your blood sugar twice a day.  vary the time of day when you check, between before the 3 meals, and at bedtime.  also check if you have symptoms of your blood sugar being too high or too low.  please keep a record of the readings and bring it to your next appointment here (or you can bring the meter itself).  You can write it on any piece of paper.  please call us sooner if your blood sugar goes below 70, or if most of your readings are over 200.    For now, please continue the same Basaglar: 5 units per day, and change the Novolog to 3 times a day (just before each meal), 3-4-5 units Please continue the same other medications.  Please Vaughan Basta, to consider an insulin pump. I have sent a prescription to your pharmacy, for the  continuous glucose monitor.  Please come back for a follow-up appointment in 3 months.

## 2020-11-21 ENCOUNTER — Ambulatory Visit: Payer: 59 | Admitting: Endocrinology

## 2020-12-06 ENCOUNTER — Ambulatory Visit (INDEPENDENT_AMBULATORY_CARE_PROVIDER_SITE_OTHER): Payer: 59 | Admitting: Endocrinology

## 2020-12-06 ENCOUNTER — Other Ambulatory Visit: Payer: Self-pay

## 2020-12-06 VITALS — BP 118/80 | HR 92 | Ht 63.0 in | Wt 161.2 lb

## 2020-12-06 DIAGNOSIS — E1165 Type 2 diabetes mellitus with hyperglycemia: Secondary | ICD-10-CM

## 2020-12-06 DIAGNOSIS — Z794 Long term (current) use of insulin: Secondary | ICD-10-CM | POA: Diagnosis not present

## 2020-12-06 LAB — POCT GLYCOSYLATED HEMOGLOBIN (HGB A1C): Hemoglobin A1C: 9.5 % — AB (ref 4.0–5.6)

## 2020-12-06 MED ORDER — TRULICITY 1.5 MG/0.5ML ~~LOC~~ SOAJ: 1.5000 mg | SUBCUTANEOUS | 3 refills | Status: DC

## 2020-12-06 MED ORDER — BASAGLAR KWIKPEN 100 UNIT/ML ~~LOC~~ SOPN: 15.0000 [IU] | PEN_INJECTOR | SUBCUTANEOUS | 3 refills | Status: DC

## 2020-12-06 NOTE — Patient Instructions (Addendum)
check your blood sugar twice a day.  vary the time of day when you check, between before the 3 meals, and at bedtime.  also check if you have symptoms of your blood sugar being too high or too low.  please keep a record of the readings and bring it to your next appointment here (or you can bring the meter itself).  You can write it on any piece of paper.  please call us sooner if your blood sugar goes below 70, or if most of your readings are over 200.   I have sent a prescription to your pharmacy, to increase the Basaglar to 15 units each morning, and:  You can stop taking the Novolog.   Please continue the same Trulicity.    Please come back for a follow-up appointment in January.

## 2020-12-06 NOTE — Progress Notes (Signed)
Subjective:    Patient ID: Samantha Willis, female    DOB: 12-19-1975, 45 y.o.   MRN: [id number]  HPI Pt returns for f/u of diabetes mellitus: DM type: 1 (intermittently ketosis prone) Dx'ed: 3491 Complications: PN Therapy: insulin since dx, metformin, and Trulicity GDM: never DKA: 2021 Severe hypoglycemia: never Pancreatitis: never Pancreatic imaging: normal on 2021 CT SDOH: she could not afford continuous glucose monitor sensors. Other: she takes multiple daily injections; she was ref to CDE to consider pump, but she did not make appt.   Interval history: She has mild hypoglycemia approx once per month.  This happens before lunch.  no cbg record, but states cbg's vary from 125-450.  It is in general highest at HS.  She takes 1-2 doses of Novolog per day.  She wants to d/c multiple daily injections.   Past Medical History:  Diagnosis Date   Anxiety    Carpal tunnel syndrome 05/06/2014    Bilateral   Diabetic ketoacidosis ([place]) 11/17/2019   GERD (gastroesophageal reflux disease)    Graves disease    Graves Disease   Heart palpitations    Obesity     Past Surgical History:  Procedure Laterality Date   Biopsy of Right Breast     At [facility] Right 11/21/2015   Procedure: CARPAL TUNNEL RELEASE right;  Surgeon: Daryll Brod, MD;  Location: [facility];  Service: Orthopedics;  Laterality: Right;  FAB   CARPAL TUNNEL RELEASE Left 06/11/2016   Procedure: LEFT CARPAL TUNNEL RELEASE;  Surgeon: Daryll Brod, MD;  Location: [facility];  Service: Orthopedics;  Laterality: Left;  REG/FAB   CESAREAN SECTION     TUBAL LIGATION  2011   WISDOM TOOTH EXTRACTION      Social History   Socioeconomic History   Marital status: Married    Spouse name: Not on file   Number of children: 2   Years of education: some col.   Highest education level: Not on file  Occupational History   Occupation: unemployed  Tobacco Use    Smoking status: Former    Packs/day: 0.25    Types: Cigarettes    Quit date: 11/10/2019    Years since quitting: 1.0   Smokeless tobacco: Never  Vaping Use   Vaping Use: Never used  Substance and Sexual Activity   Alcohol use: Yes    Alcohol/week: 0.0 standard drinks    Comment: Occassionally   Drug use: No   Sexual activity: Yes    Birth control/protection: Surgical, I.U.D., None  Other Topics Concern   Not on file  Social History Narrative   Lives at home w/ her fiance, son, brother and dad   Patient drinks 1 soda per week.   Patient is right handed   Social Determinants of Health   Financial Resource Strain: Not on file  Food Insecurity: Not on file  Transportation Needs: Not on file  Physical Activity: Not on file  Stress: Not on file  Social Connections: Not on file  Intimate Partner Violence: Not on file    Current Outpatient Medications on File Prior to Visit  Medication Sig Dispense Refill   atorvastatin (LIPITOR) 40 MG tablet Take 1 tablet (40 mg total) by mouth daily. 90 tablet 1   blood glucose meter kit and supplies Dispense based on patient and insurance preference. Use up to four times daily as directed. (FOR ICD-10 E10.9, E11.9). 1 each 0   Cholecalciferol (VITAMIN  D3) 2000 UNITS TABS Take 1 tablet by mouth daily.     Continuous Blood Gluc Receiver (FREESTYLE LIBRE 14 DAY READER) DEVI 1 each by Does not apply route daily. 1 each 2   Continuous Blood Gluc Sensor (FREESTYLE LIBRE 2 SENSOR) MISC 1 Device by Does not apply route every 14 (fourteen) days. 6 each 3   diphenhydrAMINE (BENADRYL) 25 MG tablet Take 25 mg by mouth every 6 (six) hours as needed.     ibuprofen (ADVIL,MOTRIN) 200 MG tablet Take 200 mg by mouth every 8 (eight) hours as needed.     Insulin Pen Needle 32G X 4 MM MISC Use daily for glucose control 100 each 3   Insulin Syringe-Needle U-100 27G X 1/2" 1 ML MISC Use daily for glucose control.  DxE11.9 100 each 3   metFORMIN (GLUCOPHAGE) 500 MG  tablet Take 1 tablet (500 mg total) by mouth 2 (two) times daily with a meal. 180 tablet 1   sertraline (ZOLOFT) 100 MG tablet Take 1 tablet (100 mg total) by mouth daily. 90 tablet 1   sertraline (ZOLOFT) 50 MG tablet TAKE 1 TABLET BY MOUTH EVERY DAY 90 tablet 1   No current facility-administered medications on file prior to visit.    No Known Allergies  Family History  Problem Relation Age of Onset   Heart attack Mother    Cancer Mother 48       breast   Diabetes Mother        II   Hypertension Father    Hyperlipidemia Father    Gout Father    Healthy Sister    Healthy Brother    Healthy Brother     BP 118/80 (BP Location: Right Arm, Patient Position: Sitting, Cuff Size: Normal)   Pulse 92   Ht '5\' 3"'  (1.6 m)   Wt 161 lb 3.2 oz (73.1 kg)   SpO2 97%   BMI 28.56 kg/m    Review of Systems She has HB    Objective:   Physical Exam    Lab Results  Component Value Date   HGBA1C 9.5 (A) 12/06/2020      Assessment & Plan:  Insulin-requiring type 2 DM: uncontrolled.  We'll d/c multiple daily injections.    Patient Instructions  check your blood sugar twice a day.  vary the time of day when you check, between before the 3 meals, and at bedtime.  also check if you have symptoms of your blood sugar being too high or too low.  please keep a record of the readings and bring it to your next appointment here (or you can bring the meter itself).  You can write it on any piece of paper.  please call us sooner if your blood sugar goes below 70, or if most of your readings are over 200.   I have sent a prescription to your pharmacy, to increase the Basaglar to 15 units each morning, and:  You can stop taking the Novolog.   Please continue the same Trulicity.    Please come back for a follow-up appointment in January.

## 2020-12-12 ENCOUNTER — Other Ambulatory Visit: Payer: Self-pay | Admitting: Internal Medicine

## 2020-12-12 DIAGNOSIS — E1165 Type 2 diabetes mellitus with hyperglycemia: Secondary | ICD-10-CM

## 2020-12-28 ENCOUNTER — Other Ambulatory Visit: Payer: Self-pay | Admitting: Internal Medicine

## 2021-01-17 ENCOUNTER — Ambulatory Visit: Payer: 59 | Admitting: Nurse Practitioner

## 2021-01-17 DIAGNOSIS — Z0289 Encounter for other administrative examinations: Secondary | ICD-10-CM

## 2021-01-17 NOTE — Progress Notes (Deleted)
° °  Samantha Willis 19-May-1975 [id number]   History:  45 y.o. [id number] presents for annual exam. BTL. Abnormal pap > 20 years ago, benign biopsy.  Fibroid uterus. IUD removed 05/2020 and she decided she wanted to remain off of hormonal contraception to see how her cycles do. T2DM, Graves disease managed by endocrinology, HLD and depression managed by PCP.   Gynecologic History No LMP recorded.   Contraception: tubal ligation Sexually active: Yes  Health maintenance Last Pap: 3 years ago. Results were: Normal per patient Last mammogram: 2019. Results were:  Normal Last colonoscopy: Not indicated Last Dexa:  Not indicated  Past medical history, past surgical history, family history and social history were all reviewed and documented in the EPIC chart. Married. Mother diagnosed with breast cancer at age 24, 2 aunts with breast cancer.  ROS:  A ROS was performed and pertinent positives and negatives are included.  Exam:  There were no vitals filed for this visit.  There is no height or weight on file to calculate BMI.  General appearance:  Normal Thyroid:  Symmetrical, normal in size, without palpable masses or nodularity. Respiratory  Auscultation:  Clear without wheezing or rhonchi Cardiovascular  Auscultation:  Regular rate, without rubs, murmurs or gallops  Edema/varicosities:  Not grossly evident Abdominal  Soft,nontender, without masses, guarding or rebound.  Liver/spleen:  No organomegaly noted  Hernia:  None appreciated  Skin  Inspection:  Grossly normal   Breasts: Examined lying and sitting.   Right: Without masses, retractions, discharge or axillary adenopathy.   Left: Without masses, retractions, discharge or axillary adenopathy. Genitourinary   Inguinal/mons:  Normal without inguinal adenopathy  External genitalia:  Normal appearing vulva with no masses, tenderness, or lesions  BUS/Urethra/Skene's glands:  Normal  Vagina:  Normal appearing with normal  color and discharge, no lesions  Cervix:  Normal appearing without discharge or lesions  Uterus:  Normal in size, shape and contour.  Midline and mobile, nontender  Adnexa/parametria:     Rt: Normal in size, without masses or tenderness.   Lt: Normal in size, without masses or tenderness.  Anus and perineum: Normal  Digital rectal exam: Normal sphincter tone without palpated masses or tenderness  Patient informed chaperone available to be present for breast and pelvic exam. Patient has requested no chaperone to be present. Patient has been advised what will be completed during breast and pelvic exam.  Assessment/Plan:  45 y.o. [id number] to establish care.     Screening for cervical cancer -abnormal Pap greater than 20 years ago, benign biopsy.  Normal Pap smear 2 years ago per patient.  Will repeat at 5-year interval per guidelines.  Screening for breast cancer -normal mammogram history.  Overdue for mammogram and plans to schedule this soon.  Mother and 2 aunts with history of breast cancer.  Normal breast exam today.  Follow-up in 1 year for annual.     [name] [facility], 8:20 AM 01/17/2021

## 2021-02-05 ENCOUNTER — Ambulatory Visit (INDEPENDENT_AMBULATORY_CARE_PROVIDER_SITE_OTHER): Payer: 59 | Admitting: Endocrinology

## 2021-02-05 ENCOUNTER — Other Ambulatory Visit: Payer: Self-pay

## 2021-02-05 VITALS — BP 130/84 | HR 114 | Ht 63.0 in | Wt 165.4 lb

## 2021-02-05 DIAGNOSIS — Z794 Long term (current) use of insulin: Secondary | ICD-10-CM | POA: Diagnosis not present

## 2021-02-05 DIAGNOSIS — E1165 Type 2 diabetes mellitus with hyperglycemia: Secondary | ICD-10-CM

## 2021-02-05 LAB — POCT GLYCOSYLATED HEMOGLOBIN (HGB A1C): Hemoglobin A1C: 8.6 % — AB (ref 4.0–5.6)

## 2021-02-05 MED ORDER — BASAGLAR KWIKPEN 100 UNIT/ML ~~LOC~~ SOPN: 10.0000 [IU] | PEN_INJECTOR | SUBCUTANEOUS | 3 refills | Status: DC

## 2021-02-05 MED ORDER — METFORMIN HCL ER 500 MG PO TB24: 1000.0000 mg | ORAL_TABLET | Freq: Every day | ORAL | 3 refills | Status: DC

## 2021-02-05 MED ORDER — TRULICITY 3 MG/0.5ML ~~LOC~~ SOAJ: 3.0000 mg | SUBCUTANEOUS | 3 refills | Status: DC

## 2021-02-05 NOTE — Patient Instructions (Addendum)
check your blood sugar twice a day.  vary the time of day when you check, between before the 3 meals, and at bedtime.  also check if you have symptoms of your blood sugar being too high or too low.  please keep a record of the readings and bring it to your next appointment here (or you can bring the meter itself).  You can write it on any piece of paper.  please call us sooner if your blood sugar goes below 70, or if most of your readings are over 200.   I have sent 3 prescriptions to your pharmacy: to decrease the Basaglar to 10 units each morning, increase the Trulicity, and to change the metformin to extended-release.   Please come back for a follow-up appointment in 2 months.

## 2021-02-05 NOTE — Progress Notes (Signed)
Subjective:    Patient ID: Samantha Willis, female    DOB: 1975/06/12, 46 y.o.   MRN: [id number]  HPI Pt returns for f/u of diabetes mellitus: DM type: 1 (intermittently ketosis prone) Dx'ed: 4401 Complications: PN Therapy: insulin since dx, metformin, and Trulicity: never DKA: 0272 Severe hypoglycemia: never Pancreatitis: never Pancreatic imaging: normal on 2021 CT SDOH: she could not afford continuous glucose monitor sensors. Other: she chose to d/c multiple daily injections; she was ref to CDE to consider pump, but she did not make appt.   Interval history: She has mild hypoglycemia approx once per month.  This happens after she missed lunch. no cbg record, but states cbg's vary from 79-351.  It is in general highest at HS.   Past Medical History:  Diagnosis Date   Anxiety    Carpal tunnel syndrome 05/06/2014    Bilateral   Diabetic ketoacidosis ([place]) 11/17/2019   GERD (gastroesophageal reflux disease)    Graves disease    Graves Disease   Heart palpitations    Obesity     Past Surgical History:  Procedure Laterality Date   Biopsy of Right Breast     At [facility] Right 11/21/2015   Procedure: CARPAL TUNNEL RELEASE right;  Surgeon: Daryll Brod, MD;  Location: [facility];  Service: Orthopedics;  Laterality: Right;  FAB   CARPAL TUNNEL RELEASE Left 06/11/2016   Procedure: LEFT CARPAL TUNNEL RELEASE;  Surgeon: Daryll Brod, MD;  Location: [place];  Service: Orthopedics;  Laterality: Left;  REG/FAB   CESAREAN SECTION     TUBAL LIGATION  2011   WISDOM TOOTH EXTRACTION      Social History   Socioeconomic History   Marital status: Married    Spouse name: Not on file   Number of children: 2   Years of education: some col.   Highest education level: Not on file  Occupational History   Occupation: unemployed  Tobacco Use   Smoking status: Former    Packs/day: 0.25    Types: Cigarettes    Quit  date: 11/10/2019    Years since quitting: 1.2   Smokeless tobacco: Never  Vaping Use   Vaping Use: Never used  Substance and Sexual Activity   Alcohol use: Yes    Alcohol/week: 0.0 standard drinks    Comment: Occassionally   Drug use: No   Sexual activity: Yes    Birth control/protection: Surgical, I.U.D., None  Other Topics Concern   Not on file  Social History Narrative   Lives at home w/ her fiance, son, brother and dad   Patient drinks 1 soda per week.   Patient is right handed   Social Determinants of Health   Financial Resource Strain: Not on file  Food Insecurity: Not on file  Transportation Needs: Not on file  Physical Activity: Not on file  Stress: Not on file  Social Connections: Not on file  Intimate Partner Violence: Not on file    Current Outpatient Medications on File Prior to Visit  Medication Sig Dispense Refill   atorvastatin (LIPITOR) 40 MG tablet Take 1 tablet (40 mg total) by mouth daily. 90 tablet 1   blood glucose meter kit and supplies Dispense based on patient and insurance preference. Use up to four times daily as directed. (FOR ICD-10 E10.9, E11.9). 1 each 0   Cholecalciferol (VITAMIN D3) 2000 UNITS TABS Take 1 tablet by mouth daily.  Blood Gluc Receiver (FREESTYLE LIBRE 14 DAY READER) DEVI 1 each by Does not apply route daily. 1 each 2  ° Continuous Blood Gluc Sensor (FREESTYLE LIBRE 2 SENSOR) MISC 1 Device by Does not apply route every 14 (fourteen) days. 6 each 3  ° diphenhydrAMINE (BENADRYL) 25 MG tablet Take 25 mg by mouth every 6 (six) hours as needed.    ° ibuprofen (ADVIL,MOTRIN) 200 MG tablet Take 200 mg by mouth every 8 (eight) hours as needed.    ° Insulin Pen Needle 32G X 4 MM MISC Use daily for glucose control 100 each 3  ° Insulin Syringe-Needle U-100 27G X 1/2" 1 ML MISC Use daily for glucose control.  DxE11.9 100 each 3  ° sertraline (ZOLOFT) 100 MG tablet Take 1 tablet (100 mg total) by mouth daily. 90 tablet 1  ° sertraline  (ZOLOFT) 50 MG tablet TAKE 1 TABLET BY MOUTH EVERY DAY 90 tablet 1  ° °No current facility-administered medications on file prior to visit.  ° ° °No Known Allergies ° °Family History  °Problem Relation Age of Onset  ° Heart attack Mother   ° Cancer Mother 32  °     breast  ° Diabetes Mother   °     II  ° Hypertension Father   ° Hyperlipidemia Father   ° Gout Father   ° Healthy Sister   ° Healthy Brother   ° Healthy Brother   ° ° °BP 130/84    Pulse (!) 114    Ht 5' 3" (1.6 m)    Wt 165 lb 6.4 oz (75 kg)    SpO2 97%    BMI 29.30 kg/m²  ° ° °Review of Systems °She has mild HB °   °Objective:  ° Physical Exam ° ° ° °Lab Results  °Component Value Date  ° CREATININE 0.74 06/08/2020  ° BUN 7 06/08/2020  ° NA 133 (L) 06/08/2020  ° K 3.5 06/08/2020  ° CL 99 06/08/2020  ° CO2 23 06/08/2020  ° °A1c=8.6% °   °Assessment & Plan:  °Intermittently ketosis prone DM, uncontrolled.  ° °Patient Instructions  °check your blood sugar twice a day.  vary the time of day when you check, between before the 3 meals, and at bedtime.  also check if you have symptoms of your blood sugar being too high or too low.  please keep a record of the readings and bring it to your next appointment here (or you can bring the meter itself).  You can write it on any piece of paper.  please call us sooner if your blood sugar goes below 70, or if most of your readings are over 200.   °I have sent 3 prescriptions to your pharmacy: to decrease the Basaglar to 10 units each morning, increase the Trulicity, and to change the metformin to extended-release.   °Please come back for a follow-up appointment in 2 months.  ° ° °

## 2021-02-06 ENCOUNTER — Other Ambulatory Visit: Payer: Self-pay | Admitting: Internal Medicine

## 2021-02-06 DIAGNOSIS — F411 Generalized anxiety disorder: Secondary | ICD-10-CM

## 2021-02-20 ENCOUNTER — Other Ambulatory Visit: Payer: Self-pay | Admitting: Internal Medicine

## 2021-02-20 DIAGNOSIS — F411 Generalized anxiety disorder: Secondary | ICD-10-CM

## 2021-04-04 ENCOUNTER — Ambulatory Visit (INDEPENDENT_AMBULATORY_CARE_PROVIDER_SITE_OTHER): Payer: 59 | Admitting: Endocrinology

## 2021-04-04 ENCOUNTER — Other Ambulatory Visit: Payer: Self-pay

## 2021-04-04 VITALS — BP 110/80 | HR 100 | Ht 63.0 in | Wt 159.4 lb

## 2021-04-04 DIAGNOSIS — E1165 Type 2 diabetes mellitus with hyperglycemia: Secondary | ICD-10-CM

## 2021-04-04 DIAGNOSIS — Z794 Long term (current) use of insulin: Secondary | ICD-10-CM | POA: Diagnosis not present

## 2021-04-04 LAB — POCT GLYCOSYLATED HEMOGLOBIN (HGB A1C): Hemoglobin A1C: 10.4 % — AB (ref 4.0–5.6)

## 2021-04-04 MED ORDER — TRULICITY 4.5 MG/0.5ML ~~LOC~~ SOAJ: 4.5000 mg | SUBCUTANEOUS | 3 refills | Status: DC

## 2021-04-04 MED ORDER — BASAGLAR KWIKPEN 100 UNIT/ML ~~LOC~~ SOPN: 15.0000 [IU] | PEN_INJECTOR | SUBCUTANEOUS | 3 refills | Status: DC

## 2021-04-04 NOTE — Patient Instructions (Addendum)
check your blood sugar twice a day.  vary the time of day when you check, between before the 3 meals, and at bedtime.  also check if you have symptoms of your blood sugar being too high or too low.  please keep a record of the readings and bring it to your next appointment here (or you can bring the meter itself).  You can write it on any piece of paper.  please call us sooner if your blood sugar goes below 70, or if most of your readings are over 200.   ?I have sent 2 prescriptions to your pharmacy: to increase the Basaglar to 15 units each morning, and to increase the Trulicity. ?Please continue the same metformin.   ?Please come back for a follow-up appointment in 2 months.  ?

## 2021-04-04 NOTE — Progress Notes (Signed)
? ?Subjective:  ? ? Patient ID: Samantha Willis, female    DOB: [birth year], 46 y.o.   MRN: [id number] ? ?HPI ?Pt returns for f/u of diabetes mellitus: ?DM type: 1 (intermittently ketosis prone) ?Dx'ed: 2021 ?Complications: PN ?Therapy: insulin since dx, metformin, and Trulicity.  ?DKA: 2021 ?Severe hypoglycemia: never ?Pancreatitis: never ?Pancreatic imaging: normal on 2021 CT ?SDOH: she could not afford continuous glucose monitor sensors. ?Other: she chose to d/c multiple daily injections; she was ref to CDE to consider pump, but she did not make appt; she has had TL.   ?Interval history: no cbg record, but states cbg's vary from 117-409.  She seldom misses meds. No recent steroids.   ?Past Medical History:  ?Diagnosis Date  ? Anxiety   ? Carpal tunnel syndrome 05/06/2014  ?  Bilateral  ? Diabetic ketoacidosis ([place]) 11/17/2019  ? GERD (gastroesophageal reflux disease)   ? Graves disease   ? Graves Disease  ? Heart palpitations   ? Obesity   ? ? ?Past Surgical History:  ?Procedure Laterality Date  ? Biopsy of Right Breast    ? At [facility]  ? CARPAL TUNNEL RELEASE Right 11/21/2015  ? Procedure: CARPAL TUNNEL RELEASE right;  Surgeon: Daryll Brod, MD;  Location: [place];  Service: Orthopedics;  Laterality: Right;  FAB  ? CARPAL TUNNEL RELEASE Left 06/11/2016  ? Procedure: LEFT CARPAL TUNNEL RELEASE;  Surgeon: Daryll Brod, MD;  Location: [place];  Service: Orthopedics;  Laterality: Left;  REG/FAB  ? CESAREAN SECTION    ? TUBAL LIGATION  2011  ? WISDOM TOOTH EXTRACTION    ? ? ?Social History  ? ?Socioeconomic History  ? Marital status: Married  ?  Spouse name: Not on file  ? Number of children: 2  ? Years of education: some col.  ? Highest education level: Not on file  ?Occupational History  ? Occupation: unemployed  ?Tobacco Use  ? Smoking status: Former  ?  Packs/day: 0.25  ?  Types: Cigarettes  ?  Quit date: 11/10/2019  ?  Years since quitting: 1.4  ? Smokeless  tobacco: Never  ?Vaping Use  ? Vaping Use: Never used  ?Substance and Sexual Activity  ? Alcohol use: Yes  ?  Alcohol/week: 0.0 standard drinks  ?  Comment: Occassionally  ? Drug use: No  ? Sexual activity: Yes  ?  Birth control/protection: Surgical, I.U.D., None  ?Other Topics Concern  ? Not on file  ?Social History Narrative  ? Lives at home w/ her fiance, son, brother and dad  ? Patient drinks 1 soda per week.  ? Patient is right handed  ? ?Social Determinants of Health  ? ?Financial Resource Strain: Not on file  ?Food Insecurity: Not on file  ?Transportation Needs: Not on file  ?Physical Activity: Not on file  ?Stress: Not on file  ?Social Connections: Not on file  ?Intimate Partner Violence: Not on file  ? ? ?Current Outpatient Medications on File Prior to Visit  ?Medication Sig Dispense Refill  ? atorvastatin (LIPITOR) 40 MG tablet Take 1 tablet (40 mg total) by mouth daily. 90 tablet 1  ? blood glucose meter kit and supplies Dispense based on patient and insurance preference. Use up to four times daily as directed. (FOR ICD-10 E10.9, E11.9). 1 each 0  ? Cholecalciferol (VITAMIN D3) 2000 UNITS TABS Take 1 tablet by mouth daily.    ? Continuous Blood Gluc Receiver (FREESTYLE LIBRE 14 DAY READER) DEVI 1 each  by Does not apply route daily. 1 each 2  ? Continuous Blood Gluc Sensor (FREESTYLE LIBRE 2 SENSOR) MISC 1 Device by Does not apply route every 14 (fourteen) days. 6 each 3  ? diphenhydrAMINE (BENADRYL) 25 MG tablet Take 25 mg by mouth every 6 (six) hours as needed.    ? ibuprofen (ADVIL,MOTRIN) 200 MG tablet Take 200 mg by mouth every 8 (eight) hours as needed.    ? Insulin Pen Needle 32G X 4 MM MISC Use daily for glucose control 100 each 3  ? Insulin Syringe-Needle U-100 27G X 1/2" 1 ML MISC Use daily for glucose control.  DxE11.9 100 each 3  ? metFORMIN (GLUCOPHAGE-XR) 500 MG 24 hr tablet Take 2 tablets (1,000 mg total) by mouth daily. 180 tablet 3  ? sertraline (ZOLOFT) 100 MG tablet Take 1 tablet (100 mg  total) by mouth daily. APPT NEEDED FOR FUTURE REFILLS 30 tablet 0  ? sertraline (ZOLOFT) 50 MG tablet TAKE 1 TABLET BY MOUTH EVERY DAY 90 tablet 1  ? ?No current facility-administered medications on file prior to visit.  ? ? ?No Known Allergies ? ?Family History  ?Problem Relation Age of Onset  ? Heart attack Mother   ? Cancer Mother 35  ?     breast  ? Diabetes Mother   ?     II  ? Hypertension Father   ? Hyperlipidemia Father   ? Gout Father   ? Healthy Sister   ? Healthy Brother   ? Healthy Brother   ? ? ?BP 110/80   Pulse 100   Ht 5' 3" (1.6 m)   Wt 159 lb 6.4 oz (72.3 kg)   SpO2 95%   BMI 28.24 kg/m?  ? ? ?Review of Systems ?She denies hypoglycemia/N/V/HB ?   ?Objective:  ? Physical Exam ? ? ? ?Lab Results  ?Component Value Date  ? HGBA1C 10.4 (A) 04/04/2021  ? ?   ?Assessment & Plan:  ?DM: uncontrolled ? ?Patient Instructions  ?check your blood sugar twice a day.  vary the time of day when you check, between before the 3 meals, and at bedtime.  also check if you have symptoms of your blood sugar being too high or too low.  please keep a record of the readings and bring it to your next appointment here (or you can bring the meter itself).  You can write it on any piece of paper.  please call us sooner if your blood sugar goes below 70, or if most of your readings are over 200.   ?I have sent 2 prescriptions to your pharmacy: to increase the Basaglar to 15 units each morning, and to increase the Trulicity. ?Please continue the same metformin.   ?Please come back for a follow-up appointment in 2 months.  ? ? ?

## 2021-04-18 ENCOUNTER — Encounter: Payer: Self-pay | Admitting: Obstetrics & Gynecology

## 2021-04-25 LAB — HM MAMMOGRAPHY

## 2021-04-26 ENCOUNTER — Encounter: Payer: Self-pay | Admitting: Internal Medicine

## 2021-04-27 ENCOUNTER — Encounter: Payer: Self-pay | Admitting: Obstetrics & Gynecology

## 2021-05-09 ENCOUNTER — Other Ambulatory Visit: Payer: Self-pay

## 2021-05-11 ENCOUNTER — Encounter: Payer: Self-pay | Admitting: Obstetrics & Gynecology

## 2021-06-06 ENCOUNTER — Ambulatory Visit: Payer: 59 | Admitting: Endocrinology

## 2021-06-06 ENCOUNTER — Encounter: Payer: Self-pay | Admitting: Obstetrics & Gynecology

## 2021-06-06 ENCOUNTER — Ambulatory Visit (INDEPENDENT_AMBULATORY_CARE_PROVIDER_SITE_OTHER): Payer: 59 | Admitting: Obstetrics & Gynecology

## 2021-06-06 ENCOUNTER — Other Ambulatory Visit (HOSPITAL_COMMUNITY)
Admission: RE | Admit: 2021-06-06 | Discharge: 2021-06-06 | Disposition: A | Discharge status: Home / Self Care | Payer: 59 | Source: Ambulatory Visit | Attending: Obstetrics & Gynecology | Admitting: Obstetrics & Gynecology

## 2021-06-06 VITALS — BP 104/68 | HR 68 | Resp 16 | Ht 63.75 in | Wt 146.0 lb

## 2021-06-06 DIAGNOSIS — Z01419 Encounter for gynecological examination (general) (routine) without abnormal findings: Secondary | ICD-10-CM | POA: Insufficient documentation

## 2021-06-06 DIAGNOSIS — N920 Excessive and frequent menstruation with regular cycle: Secondary | ICD-10-CM

## 2021-06-06 DIAGNOSIS — N898 Other specified noninflammatory disorders of vagina: Secondary | ICD-10-CM

## 2021-06-06 DIAGNOSIS — D259 Leiomyoma of uterus, unspecified: Secondary | ICD-10-CM | POA: Diagnosis not present

## 2021-06-06 DIAGNOSIS — Z9851 Tubal ligation status: Secondary | ICD-10-CM | POA: Diagnosis not present

## 2021-06-06 LAB — CBC
HCT: 37.7 % (ref 35.0–45.0)
Hemoglobin: 12.8 g/dL (ref 11.7–15.5)
MCH: 34.2 pg — ABNORMAL HIGH (ref 27.0–33.0)
MCHC: 34 g/dL (ref 32.0–36.0)
MCV: 100.8 fL — ABNORMAL HIGH (ref 80.0–100.0)
MPV: 10.1 fL (ref 7.5–12.5)
Platelets: 353 10*3/uL (ref 140–400)
RBC: 3.74 10*6/uL — ABNORMAL LOW (ref 3.80–5.10)
RDW: 11.4 % (ref 11.0–15.0)
WBC: 8.5 10*3/uL (ref 3.8–10.8)

## 2021-06-06 LAB — WET PREP FOR TRICH, YEAST, CLUE

## 2021-06-06 MED ORDER — CLOBETASOL PROPIONATE 0.05 % EX OINT: 1.0000 "application " | TOPICAL_OINTMENT | Freq: Every day | CUTANEOUS | 0 refills | Status: AC

## 2021-06-06 MED ORDER — NORETHINDRONE 0.35 MG PO TABS: 1.0000 | ORAL_TABLET | Freq: Every day | ORAL | 4 refills | Status: DC

## 2021-06-06 MED ORDER — FLUCONAZOLE 150 MG PO TABS: 150.0000 mg | ORAL_TABLET | ORAL | 0 refills | Status: AC

## 2021-06-06 NOTE — Progress Notes (Signed)
? ? ?Samantha Willis Jan 25, 1976 [id number] ? ? ?History:    46 y.o. [id number]  S/P TL/Unilateral Salpingectomy ? ?RP:  Established patient presenting for annual gyn exam  ? ?HPI: Menses regular every month with heavy flow.  Known uterine fibroids.  No pelvic pain.  Increased vaginal discharge with itching and vulvar irritation.  Abnormal pap > 20 years ago, benign biopsy.  Paps normal since then, last 2019.  Pap reflex today.  Breasts stable. Bilateral mammo 03/2021.  Rt Breast Bx 04/2021 Benign. Mother and 2 aunts with breast cancer.  Health labs with Fam MD.  T2DM, Graves disease managed elsewhere.  Recommend scheduling a Colono. ?  ? ?Past medical history,surgical history, family history and social history were all reviewed and documented in the EPIC chart. ? ?Gynecologic History ?Patient's last menstrual period was 05/27/2021 (exact date). ? ?Obstetric History ?OB History  ?Gravida Para Term Preterm AB Living  ?'5 2 2   3 2  '$ ?SAB IAB Ectopic Multiple Live Births  ?0   3   2  ?  ?# Outcome Date GA Lbr Len/2nd Weight Sex Delivery Anes PTL Lv  ?5 Term           ?4 Term           ?3 Ectopic           ?2 Ectopic           ?1 Ectopic           ? ? ? ?ROS: A ROS was performed and pertinent positives and negatives are included in the history. ? GENERAL: No fevers or chills. HEENT: No change in vision, no earache, sore throat or sinus congestion. NECK: No pain or stiffness. CARDIOVASCULAR: No chest pain or pressure. No palpitations. PULMONARY: No shortness of breath, cough or wheeze. GASTROINTESTINAL: No abdominal pain, nausea, vomiting or diarrhea, melena or bright red blood per rectum. GENITOURINARY: No urinary frequency, urgency, hesitancy or dysuria. MUSCULOSKELETAL: No joint or muscle pain, no back pain, no recent trauma. DERMATOLOGIC: No rash, no itching, no lesions. ENDOCRINE: No polyuria, polydipsia, no heat or cold intolerance. No recent change in weight. HEMATOLOGICAL: No anemia or easy bruising or  bleeding. NEUROLOGIC: No headache, seizures, numbness, tingling or weakness. PSYCHIATRIC: No depression, no loss of interest in normal activity or change in sleep pattern.  ?  ? ?Exam: ? ? ?BP 104/68   Pulse 68   Resp 16   Ht 5' 3.75" (1.619 m)   Wt 146 lb (66.2 kg)   LMP 05/27/2021 (Exact Date)   BMI 25.26 kg/m?  ? ?Body mass index is 25.26 kg/m?. ? ?General appearance : Well developed well nourished female. No acute distress ?HEENT: Eyes: no retinal hemorrhage or exudates,  Neck supple, trachea midline, no carotid bruits, no thyroidmegaly ?Lungs: Clear to auscultation, no rhonchi or wheezes, or rib retractions  ?Heart: Regular rate and rhythm, no murmurs or gallops ?Breast:Examined in sitting and supine position were symmetrical in appearance, no palpable masses on the left, right mass (benign Bx in 04/2021) or tenderness,  no skin retraction, no nipple inversion, no nipple discharge, no skin discoloration, no axillary or supraclavicular lymphadenopathy ?Abdomen: no palpable masses or tenderness, no rebound or guarding ?Extremities: no edema or skin discoloration or tenderness ? ?Pelvic: Vulva: Erythema present ?            Vagina: No gross lesions.  Vaginal discharge present.  Wet prep done. ? Cervix: No gross lesions or discharge ? Uterus AV,  normal size, shape and consistency, non-tender and mobile ? Adnexa  Without masses or tenderness ? Anus: Normal ? ?Wet prep: Neg ? ? ?Assessment/Plan:  46 y.o. female for annual exam  ? ?1. Encounter for routine gynecological examination with Papanicolaou smear of cervix ?Menses regular every month with heavy flow.  Known uterine fibroids.  No pelvic pain.  Increased vaginal discharge with itching and vulvar irritation.  Abnormal pap > 20 years ago, benign biopsy.  Paps normal since then, last 2019.  Pap reflex today.  Breasts stable. Bilateral mammo 03/2021.  Rt Breast Bx 04/2021 Benign. Mother and 2 aunts with breast cancer.  Health labs with Fam MD.  T2DM, Graves  disease managed elsewhere.  Recommend scheduling a Colono. ?- Cytology - PAP( [facility]) ? ?2. S/P tubal ligation ? ?3. Menorrhagia with regular cycle ?Will try the Progestin pill to control the flow.  No CI.  Usage reviewed and prescription sent to pharmacy.  R/O anemia with a CBC. ?- CBC ? ?4. Uterine leiomyoma, unspecified location ? ?5. Vaginal discharge ?Probable mild yeast vaginitis coming and going.  Vulvar irritation.  Fluconazole PO and Clobetasol ointment, usage reviewed and prescriptions sent to pharmacy. ?- WET PREP FOR [place], YEAST, CLUE ? ?Other orders ?- NOVOLOG FLEXPEN 100 UNIT/ML FlexPen; SMARTSIG:4 Unit(s) SUB-Q 3 Times Daily ?- TRULICITY 3 KL/4.9ZP SOPN; Inject into the skin. ?- fluconazole (DIFLUCAN) 150 MG tablet; Take 1 tablet (150 mg total) by mouth every other day for 3 doses. ?- norethindrone (MICRONOR) 0.35 MG tablet; Take 1 tablet (0.35 mg total) by mouth daily. ?- clobetasol ointment (TEMOVATE) 0.05 %; Apply 1 application. topically daily for 14 days.  ? ?Princess Bruins MD, 2:47 PM 06/06/2021 ? ?  ?

## 2021-06-11 LAB — CYTOLOGY - PAP: Diagnosis: NEGATIVE

## 2021-06-27 ENCOUNTER — Inpatient Hospital Stay (HOSPITAL_COMMUNITY)
Admission: EM | Admit: 2021-06-27 | Discharge: 2021-06-29 | DRG: 638 | Disposition: A | Discharge status: Home / Self Care | Payer: 59 | Attending: Internal Medicine | Admitting: Internal Medicine

## 2021-06-27 ENCOUNTER — Other Ambulatory Visit: Payer: Self-pay

## 2021-06-27 ENCOUNTER — Ambulatory Visit: Payer: 59 | Admitting: Internal Medicine

## 2021-06-27 ENCOUNTER — Emergency Department (HOSPITAL_COMMUNITY): Payer: 59

## 2021-06-27 DIAGNOSIS — E875 Hyperkalemia: Secondary | ICD-10-CM | POA: Diagnosis present

## 2021-06-27 DIAGNOSIS — Z720 Tobacco use: Secondary | ICD-10-CM | POA: Diagnosis not present

## 2021-06-27 DIAGNOSIS — R8290 Unspecified abnormal findings in urine: Secondary | ICD-10-CM | POA: Diagnosis not present

## 2021-06-27 DIAGNOSIS — R9431 Abnormal electrocardiogram [ECG] [EKG]: Secondary | ICD-10-CM | POA: Diagnosis present

## 2021-06-27 DIAGNOSIS — Z91148 Patient's other noncompliance with medication regimen for other reason: Secondary | ICD-10-CM | POA: Diagnosis not present

## 2021-06-27 DIAGNOSIS — E876 Hypokalemia: Secondary | ICD-10-CM | POA: Diagnosis present

## 2021-06-27 DIAGNOSIS — F419 Anxiety disorder, unspecified: Secondary | ICD-10-CM | POA: Diagnosis present

## 2021-06-27 DIAGNOSIS — T383X6A Underdosing of insulin and oral hypoglycemic [antidiabetic] drugs, initial encounter: Secondary | ICD-10-CM | POA: Diagnosis present

## 2021-06-27 DIAGNOSIS — Z8639 Personal history of other endocrine, nutritional and metabolic disease: Secondary | ICD-10-CM | POA: Diagnosis not present

## 2021-06-27 DIAGNOSIS — F1090 Alcohol use, unspecified, uncomplicated: Secondary | ICD-10-CM | POA: Diagnosis present

## 2021-06-27 DIAGNOSIS — N179 Acute kidney failure, unspecified: Secondary | ICD-10-CM | POA: Diagnosis present

## 2021-06-27 DIAGNOSIS — E871 Hypo-osmolality and hyponatremia: Secondary | ICD-10-CM | POA: Diagnosis present

## 2021-06-27 DIAGNOSIS — K219 Gastro-esophageal reflux disease without esophagitis: Secondary | ICD-10-CM | POA: Diagnosis present

## 2021-06-27 DIAGNOSIS — F17210 Nicotine dependence, cigarettes, uncomplicated: Secondary | ICD-10-CM | POA: Diagnosis present

## 2021-06-27 DIAGNOSIS — R350 Frequency of micturition: Secondary | ICD-10-CM | POA: Diagnosis present

## 2021-06-27 DIAGNOSIS — Z794 Long term (current) use of insulin: Secondary | ICD-10-CM

## 2021-06-27 DIAGNOSIS — D72828 Other elevated white blood cell count: Secondary | ICD-10-CM | POA: Diagnosis present

## 2021-06-27 DIAGNOSIS — D72829 Elevated white blood cell count, unspecified: Secondary | ICD-10-CM

## 2021-06-27 DIAGNOSIS — Z79899 Other long term (current) drug therapy: Secondary | ICD-10-CM

## 2021-06-27 DIAGNOSIS — N390 Urinary tract infection, site not specified: Secondary | ICD-10-CM | POA: Diagnosis present

## 2021-06-27 DIAGNOSIS — Z7984 Long term (current) use of oral hypoglycemic drugs: Secondary | ICD-10-CM

## 2021-06-27 DIAGNOSIS — E1110 Type 2 diabetes mellitus with ketoacidosis without coma: Secondary | ICD-10-CM | POA: Diagnosis present

## 2021-06-27 DIAGNOSIS — E1165 Type 2 diabetes mellitus with hyperglycemia: Secondary | ICD-10-CM

## 2021-06-27 LAB — COMPREHENSIVE METABOLIC PANEL
ALT: 26 U/L (ref 0–44)
AST: 24 U/L (ref 15–41)
Albumin: 4.5 g/dL (ref 3.5–5.0)
Alkaline Phosphatase: 92 U/L (ref 38–126)
BUN: 11 mg/dL (ref 6–20)
CO2: <7 mmol/L — ABNORMAL LOW (ref 22–32)
Calcium: 8.6 mg/dL — ABNORMAL LOW (ref 8.9–10.3)
Chloride: 100 mmol/L (ref 98–111)
Creatinine, Ser: 1.5 mg/dL — ABNORMAL HIGH (ref 0.44–1.00)
GFR, Estimated: 44 mL/min — ABNORMAL LOW (ref >60)
Glucose, Bld: 511 mg/dL (ref 70–99)
Potassium: 5.3 mmol/L — ABNORMAL HIGH (ref 3.5–5.1)
Sodium: 128 mmol/L — ABNORMAL LOW (ref 135–145)
Total Bilirubin: 1.4 mg/dL — ABNORMAL HIGH (ref 0.3–1.2)
Total Protein: 8.9 g/dL — ABNORMAL HIGH (ref 6.5–8.1)

## 2021-06-27 LAB — URINALYSIS, ROUTINE W REFLEX MICROSCOPIC
Bilirubin Urine: NEGATIVE
Glucose, UA: >=500 mg/dL — AB
Ketones, ur: 80 mg/dL — AB
Leukocytes,Ua: NEGATIVE
Nitrite: NEGATIVE
Protein, ur: 100 mg/dL — AB
RBC / HPF: >50 RBC/hpf — ABNORMAL HIGH (ref 0–5)
Specific Gravity, Urine: 1.021 (ref 1.005–1.030)
pH: 5 (ref 5.0–8.0)

## 2021-06-27 LAB — BASIC METABOLIC PANEL
Anion gap: 18 — ABNORMAL HIGH (ref 5–15)
Anion gap: 14 (ref 5–15)
BUN: 11 mg/dL (ref 6–20)
BUN: 11 mg/dL (ref 6–20)
BUN: 12 mg/dL (ref 6–20)
CO2: <7 mmol/L — ABNORMAL LOW (ref 22–32)
CO2: 7 mmol/L — ABNORMAL LOW (ref 22–32)
CO2: 12 mmol/L — ABNORMAL LOW (ref 22–32)
Calcium: 8.3 mg/dL — ABNORMAL LOW (ref 8.9–10.3)
Calcium: 8.1 mg/dL — ABNORMAL LOW (ref 8.9–10.3)
Calcium: 7.9 mg/dL — ABNORMAL LOW (ref 8.9–10.3)
Chloride: 112 mmol/L — ABNORMAL HIGH (ref 98–111)
Chloride: 111 mmol/L (ref 98–111)
Chloride: 109 mmol/L (ref 98–111)
Creatinine, Ser: 1.36 mg/dL — ABNORMAL HIGH (ref 0.44–1.00)
Creatinine, Ser: 1.45 mg/dL — ABNORMAL HIGH (ref 0.44–1.00)
Creatinine, Ser: 1.35 mg/dL — ABNORMAL HIGH (ref 0.44–1.00)
GFR, Estimated: 49 mL/min — ABNORMAL LOW (ref >60)
GFR, Estimated: 45 mL/min — ABNORMAL LOW (ref >60)
GFR, Estimated: 49 mL/min — ABNORMAL LOW (ref >60)
Glucose, Bld: 195 mg/dL — ABNORMAL HIGH (ref 70–99)
Glucose, Bld: 232 mg/dL — ABNORMAL HIGH (ref 70–99)
Glucose, Bld: 223 mg/dL — ABNORMAL HIGH (ref 70–99)
Potassium: 5.1 mmol/L (ref 3.5–5.1)
Potassium: 5.5 mmol/L — ABNORMAL HIGH (ref 3.5–5.1)
Potassium: 4.1 mmol/L (ref 3.5–5.1)
Sodium: 136 mmol/L (ref 135–145)
Sodium: 136 mmol/L (ref 135–145)
Sodium: 135 mmol/L (ref 135–145)

## 2021-06-27 LAB — CBC WITH DIFFERENTIAL/PLATELET
Abs Immature Granulocytes: 0.18 10*3/uL — ABNORMAL HIGH (ref 0.00–0.07)
Basophils Absolute: 0.1 10*3/uL (ref 0.0–0.1)
Basophils Relative: 0 %
Eosinophils Absolute: 0 10*3/uL (ref 0.0–0.5)
Eosinophils Relative: 0 %
HCT: 44.9 % (ref 36.0–46.0)
Hemoglobin: 14.4 g/dL (ref 12.0–15.0)
Immature Granulocytes: 1 %
Lymphocytes Relative: 14 %
Lymphs Abs: 2.1 10*3/uL (ref 0.7–4.0)
MCH: 34.9 pg — ABNORMAL HIGH (ref 26.0–34.0)
MCHC: 32.1 g/dL (ref 30.0–36.0)
MCV: 108.7 fL — ABNORMAL HIGH (ref 80.0–100.0)
Monocytes Absolute: 0.5 10*3/uL (ref 0.1–1.0)
Monocytes Relative: 4 %
Neutro Abs: 11.7 10*3/uL — ABNORMAL HIGH (ref 1.7–7.7)
Neutrophils Relative %: 81 %
Platelets: 331 10*3/uL (ref 150–400)
RBC: 4.13 MIL/uL (ref 3.87–5.11)
RDW: 12.8 % (ref 11.5–15.5)
WBC: 14.6 10*3/uL — ABNORMAL HIGH (ref 4.0–10.5)
nRBC: 0 % (ref 0.0–0.2)

## 2021-06-27 LAB — I-STAT VENOUS BLOOD GAS, ED
Acid-base deficit: 29 mmol/L — ABNORMAL HIGH (ref 0.0–2.0)
Bicarbonate: 3.7 mmol/L — ABNORMAL LOW (ref 20.0–28.0)
Calcium, Ion: 1.17 mmol/L (ref 1.15–1.40)
HCT: 49 % — ABNORMAL HIGH (ref 36.0–46.0)
Hemoglobin: 16.7 g/dL — ABNORMAL HIGH (ref 12.0–15.0)
O2 Saturation: 83 %
Potassium: 6.1 mmol/L — ABNORMAL HIGH (ref 3.5–5.1)
Sodium: 131 mmol/L — ABNORMAL LOW (ref 135–145)
TCO2: <5 mmol/L — ABNORMAL LOW (ref 22–32)
pCO2, Ven: 19.8 mmHg — CL (ref 44–60)
pH, Ven: 6.876 — CL (ref 7.25–7.43)
pO2, Ven: 79 mmHg — ABNORMAL HIGH (ref 32–45)

## 2021-06-27 LAB — CBG MONITORING, ED
Glucose-Capillary: 548 mg/dL (ref 70–99)
Glucose-Capillary: 491 mg/dL — ABNORMAL HIGH (ref 70–99)
Glucose-Capillary: 319 mg/dL — ABNORMAL HIGH (ref 70–99)
Glucose-Capillary: 430 mg/dL — ABNORMAL HIGH (ref 70–99)
Glucose-Capillary: 240 mg/dL — ABNORMAL HIGH (ref 70–99)
Glucose-Capillary: 165 mg/dL — ABNORMAL HIGH (ref 70–99)
Glucose-Capillary: 193 mg/dL — ABNORMAL HIGH (ref 70–99)

## 2021-06-27 LAB — I-STAT BETA HCG BLOOD, ED (MC, WL, AP ONLY): I-stat hCG, quantitative: <5 m[IU]/mL (ref <5)

## 2021-06-27 LAB — GLUCOSE, CAPILLARY
Glucose-Capillary: 192 mg/dL — ABNORMAL HIGH (ref 70–99)
Glucose-Capillary: 195 mg/dL — ABNORMAL HIGH (ref 70–99)
Glucose-Capillary: 240 mg/dL — ABNORMAL HIGH (ref 70–99)
Glucose-Capillary: 246 mg/dL — ABNORMAL HIGH (ref 70–99)
Glucose-Capillary: 249 mg/dL — ABNORMAL HIGH (ref 70–99)
Glucose-Capillary: 254 mg/dL — ABNORMAL HIGH (ref 70–99)
Glucose-Capillary: 267 mg/dL — ABNORMAL HIGH (ref 70–99)

## 2021-06-27 LAB — BETA-HYDROXYBUTYRIC ACID
Beta-Hydroxybutyric Acid: >8 mmol/L — ABNORMAL HIGH (ref 0.05–0.27)
Beta-Hydroxybutyric Acid: 7.82 mmol/L — ABNORMAL HIGH (ref 0.05–0.27)

## 2021-06-27 LAB — HIV ANTIBODY (ROUTINE TESTING W REFLEX): HIV Screen 4th Generation wRfx: NONREACTIVE

## 2021-06-27 LAB — TROPONIN I (HIGH SENSITIVITY): Troponin I (High Sensitivity): 7 ng/L (ref <18)

## 2021-06-27 LAB — PREGNANCY, URINE: Preg Test, Ur: NEGATIVE

## 2021-06-27 LAB — LIPASE, BLOOD: Lipase: 29 U/L (ref 11–51)

## 2021-06-27 MED ORDER — SODIUM CHLORIDE 0.9 % IV SOLN: INTRAVENOUS | Status: DC

## 2021-06-27 MED ORDER — SERTRALINE HCL 100 MG PO TABS: 100.0000 mg | ORAL_TABLET | Freq: Every day | ORAL | Status: DC

## 2021-06-27 MED ORDER — INSULIN REGULAR(HUMAN) IN NACL 100-0.9 UT/100ML-% IV SOLN
INTRAVENOUS | Status: DC
  Administered 2021-06-27: 7 [IU]/h via INTRAVENOUS
  Administered 2021-06-28: 1.5 [IU]/h via INTRAVENOUS
  Filled 2021-06-27 (×2): qty 100

## 2021-06-27 MED ORDER — MORPHINE SULFATE (PF) 2 MG/ML IV SOLN
2.0000 mg | INTRAVENOUS | Status: DC | PRN
  Administered 2021-06-27 (×2): 2 mg via INTRAVENOUS
  Filled 2021-06-27 (×2): qty 1

## 2021-06-27 MED ORDER — CALCIUM GLUCONATE-NACL 1-0.675 GM/50ML-% IV SOLN
1.0000 g | Freq: Once | INTRAVENOUS | Status: DC
Start: 2021-06-27 — End: 2021-06-27
  Filled 2021-06-27 (×2): qty 50

## 2021-06-27 MED ORDER — ACETAMINOPHEN 325 MG PO TABS: 650.0000 mg | ORAL_TABLET | Freq: Four times a day (QID) | ORAL | Status: DC | PRN

## 2021-06-27 MED ORDER — HYDRALAZINE HCL 20 MG/ML IJ SOLN: 10.0000 mg | INTRAMUSCULAR | Status: DC | PRN

## 2021-06-27 MED ORDER — ONDANSETRON HCL 4 MG/2ML IJ SOLN
4.0000 mg | Freq: Once | INTRAMUSCULAR | Status: AC
Start: 2021-06-27 — End: 2021-06-27
  Administered 2021-06-27: 4 mg via INTRAVENOUS
  Filled 2021-06-27: qty 2

## 2021-06-27 MED ORDER — SODIUM CHLORIDE 0.9 % IV BOLUS
1000.0000 mL | Freq: Once | INTRAVENOUS | Status: AC
  Administered 2021-06-27: 1000 mL via INTRAVENOUS

## 2021-06-27 MED ORDER — NICOTINE 14 MG/24HR TD PT24
14.0000 mg | MEDICATED_PATCH | Freq: Every day | TRANSDERMAL | Status: DC
  Administered 2021-06-28 – 2021-06-29 (×2): 14 mg via TRANSDERMAL
  Filled 2021-06-27 (×2): qty 1

## 2021-06-27 MED ORDER — DEXTROSE 50 % IV SOLN: 0.0000 mL | INTRAVENOUS | Status: DC | PRN

## 2021-06-27 MED ORDER — LACTATED RINGERS IV BOLUS
20.0000 mL/kg | Freq: Once | INTRAVENOUS | Status: AC
Start: 2021-06-27 — End: 2021-06-27
  Administered 2021-06-27: 1360 mL via INTRAVENOUS

## 2021-06-27 MED ORDER — TRIMETHOBENZAMIDE HCL 100 MG/ML IM SOLN
200.0000 mg | Freq: Three times a day (TID) | INTRAMUSCULAR | Status: DC | PRN
Start: 2021-06-27 — End: 2021-06-29
  Administered 2021-06-27: 200 mg via INTRAMUSCULAR
  Filled 2021-06-27 (×2): qty 2

## 2021-06-27 MED ORDER — ENOXAPARIN SODIUM 40 MG/0.4ML IJ SOSY
40.0000 mg | PREFILLED_SYRINGE | INTRAMUSCULAR | Status: DC
  Administered 2021-06-27 – 2021-06-29 (×3): 40 mg via SUBCUTANEOUS
  Filled 2021-06-27 (×3): qty 0.4

## 2021-06-27 MED ORDER — SODIUM BICARBONATE 8.4 % IV SOLN
INTRAVENOUS | Status: DC
  Filled 2021-06-27 (×4): qty 1000

## 2021-06-27 MED ORDER — SODIUM CHLORIDE 0.9 % IV SOLN
1.0000 g | INTRAVENOUS | Status: DC
  Administered 2021-06-27 – 2021-06-29 (×3): 1 g via INTRAVENOUS
  Filled 2021-06-27 (×3): qty 10

## 2021-06-27 MED ORDER — LACTATED RINGERS IV SOLN: INTRAVENOUS | Status: DC

## 2021-06-27 MED ORDER — FENTANYL CITRATE PF 50 MCG/ML IJ SOSY
50.0000 ug | PREFILLED_SYRINGE | Freq: Once | INTRAMUSCULAR | Status: AC
  Administered 2021-06-27: 50 ug via INTRAVENOUS
  Filled 2021-06-27: qty 1

## 2021-06-27 MED ORDER — ONDANSETRON HCL 4 MG/2ML IJ SOLN: 4.0000 mg | Freq: Four times a day (QID) | INTRAMUSCULAR | Status: DC | PRN

## 2021-06-27 MED ORDER — DEXTROSE IN LACTATED RINGERS 5 % IV SOLN: INTRAVENOUS | Status: DC

## 2021-06-27 MED ORDER — NORETHINDRONE 0.35 MG PO TABS: 1.0000 | ORAL_TABLET | Freq: Every day | ORAL | Status: DC

## 2021-06-27 MED ORDER — BUTALBITAL-APAP-CAFFEINE 50-325-40 MG PO TABS: 1.0000 | ORAL_TABLET | Freq: Four times a day (QID) | ORAL | Status: DC | PRN

## 2021-06-27 NOTE — ED Provider Notes (Signed)
[facility] EMERGENCY DEPARTMENT Provider Note   CSN: [id number] Arrival date & time: 06/27/21  0160     History  Chief Complaint  Patient presents with   Chest Pain   Emesis    Samantha Willis is a 46 y.o. female.   Chest Pain Associated symptoms: vomiting   Emesis  This patient is a 46 year old female, she presents to the hospital from EMS from home after complaining of having a feeling of nausea yesterday, this culminated with vomiting around midnight and she has been vomiting the better part of the evening as well as suffering with diarrhea.  She is a known diabetic, she is on insulin, she is on Trulicity, she denies a history of pancreatitis, cholecystitis or cardiac disease.  Her EKG shows peaked T waves but has seen peak T waves in the past as well.  Review of the medical record shows no prior echocardiograms or heart catheterizations or stress tests.  The patient endorses smoking tobacco, she also drinks occasional alcohol.  She does not use any other drugs.    Home Medications Prior to Admission medications   Medication Sig Start Date End Date Taking? Authorizing Provider  blood glucose meter kit and supplies Dispense based on patient and insurance preference. Use up to four times daily as directed. (FOR ICD-10 E10.9, E11.9). 11/19/19   Darliss Cheney, MD  Cholecalciferol (VITAMIN D3) 2000 UNITS TABS Take 1 tablet by mouth daily.    [provider]  Continuous Blood Gluc Receiver (FREESTYLE LIBRE 14 DAY READER) DEVI 1 each by Does not apply route daily. 03/15/20   Isaac Bliss, Rayford Halsted, MD  Continuous Blood Gluc Sensor (FREESTYLE LIBRE 2 SENSOR) MISC 1 Device by Does not apply route every 14 (fourteen) days. 09/21/20   Renato Shin, MD  diphenhydrAMINE (BENADRYL) 25 MG tablet Take 25 mg by mouth every 6 (six) hours as needed.    [provider]  ibuprofen (ADVIL,MOTRIN) 200 MG tablet Take 200 mg by mouth every 8 (eight)  hours as needed.    [provider]  Insulin Glargine (BASAGLAR KWIKPEN) 100 UNIT/ML Inject 15 Units into the skin every morning. 04/04/21   Renato Shin, MD  Insulin Pen Needle 32G X 4 MM MISC Use daily for glucose control 12/15/19   Isaac Bliss, Rayford Halsted, MD  Insulin Syringe-Needle U-100 27G X 1/2" 1 ML MISC Use daily for glucose control.  DxE11.9 01/05/20   Isaac Bliss, Rayford Halsted, MD  metFORMIN (GLUCOPHAGE-XR) 500 MG 24 hr tablet Take 2 tablets (1,000 mg total) by mouth daily. 02/05/21   Renato Shin, MD  norethindrone (MICRONOR) 0.35 MG tablet Take 1 tablet (0.35 mg total) by mouth daily. 06/06/21   Princess Bruins, MD  NOVOLOG FLEXPEN 100 UNIT/ML FlexPen SMARTSIG:4 Unit(s) SUB-Q 3 Times Daily 01/11/21   [provider]  sertraline (ZOLOFT) 100 MG tablet Take 1 tablet (100 mg total) by mouth daily. APPT NEEDED FOR FUTURE REFILLS 02/06/21   Isaac Bliss, Rayford Halsted, MD  sertraline (ZOLOFT) 50 MG tablet TAKE 1 TABLET BY MOUTH EVERY DAY 03/28/20   Isaac Bliss, Rayford Halsted, MD  TRULICITY 3 FU/9.3AT SOPN Inject into the skin. 05/29/21   [provider]      Allergies    Patient has no known allergies.    Review of Systems   Review of Systems  Cardiovascular:  Positive for chest pain.  Gastrointestinal:  Positive for vomiting.  All other systems reviewed and are negative.  Physical Exam Updated  Vital Signs BP (!) 123/93   Pulse 73   Temp (!) 97.5 F (36.4 C) (Oral)   Resp 19   Wt 68 kg   SpO2 99%   BMI 25.95 kg/m  Physical Exam Vitals and nursing note reviewed.  Constitutional:      General: She is not in acute distress.    Appearance: She is well-developed.  HENT:     Head: Normocephalic and atraumatic.     Mouth/Throat:     Pharynx: No oropharyngeal exudate.  Eyes:     General: No scleral icterus.       Right eye: No discharge.        Left eye: No discharge.     Conjunctiva/sclera: Conjunctivae normal.     Pupils: Pupils are equal,  round, and reactive to light.  Neck:     Thyroid: No thyromegaly.     Vascular: No JVD.  Cardiovascular:     Rate and Rhythm: Normal rate and regular rhythm.     Heart sounds: Normal heart sounds. No murmur heard.   No friction rub. No gallop.  Pulmonary:     Effort: Pulmonary effort is normal. No respiratory distress.     Breath sounds: Normal breath sounds. No wheezing or rales.  Abdominal:     General: Bowel sounds are normal. There is no distension.     Palpations: Abdomen is soft. There is no mass.     Tenderness: There is abdominal tenderness.  Musculoskeletal:        General: No tenderness. Normal range of motion.     Cervical back: Normal range of motion and neck supple.  Lymphadenopathy:     Cervical: No cervical adenopathy.  Skin:    General: Skin is warm and dry.     Findings: No erythema or rash.  Neurological:     Mental Status: She is alert.     Coordination: Coordination normal.  Psychiatric:        Behavior: Behavior normal.    ED Results / Procedures / Treatments   Labs (all labs ordered are listed, but only abnormal results are displayed) Labs Reviewed  COMPREHENSIVE METABOLIC PANEL - Abnormal; Notable for the following components:      Result Value   Sodium 128 (*)    Potassium 5.3 (*)    CO2 <7 (*)    Glucose, Bld 511 (*)    Creatinine, Ser 1.50 (*)    Calcium 8.6 (*)    Total Protein 8.9 (*)    Total Bilirubin 1.4 (*)    GFR, Estimated 44 (*)    All other components within normal limits  CBC WITH DIFFERENTIAL/PLATELET - Abnormal; Notable for the following components:   WBC 14.6 (*)    MCV 108.7 (*)    MCH 34.9 (*)    Neutro Abs 11.7 (*)    Abs Immature Granulocytes 0.18 (*)    All other components within normal limits  CBG MONITORING, ED - Abnormal; Notable for the following components:   Glucose-Capillary 548 (*)    All other components within normal limits  CBG MONITORING, ED - Abnormal; Notable for the following components:    Glucose-Capillary 491 (*)    All other components within normal limits  LIPASE, BLOOD  URINALYSIS, ROUTINE W REFLEX MICROSCOPIC  PREGNANCY, URINE  BETA-HYDROXYBUTYRIC ACID  BETA-HYDROXYBUTYRIC ACID  I-STAT BETA HCG BLOOD, ED ([facility], [facility], [facility] ONLY)  I-STAT VENOUS BLOOD GAS, ED    EKG EKG Interpretation  Date/Time:  Wednesday Jun 27 2021 06:51:58 EDT Ventricular Rate:  84 PR Interval:  119 QRS Duration: 80 QT Interval:  473 QTC Calculation: 560 R Axis:   46 Text Interpretation: Sinus rhythm Borderline short PR interval Consider right atrial enlargement Prolonged QT interval T wave peaked, similar to prior Confirmed by Noemi Chapel [phone]) on 06/27/2021 6:54:33 AM  Radiology DG Chest Portable 1 View  Result Date: 06/27/2021 CLINICAL DATA:  46 year old female with history of chest pain and emesis. Shortness of breath. EXAM: PORTABLE CHEST 1 VIEW COMPARISON:  Chest x-ray 11/17/2019. FINDINGS: Lung volumes are normal. No consolidative airspace disease. No pleural effusions. No pneumothorax. No pulmonary nodule or mass noted. Pulmonary vasculature and the cardiomediastinal silhouette are within normal limits. IMPRESSION: No radiographic evidence of acute cardiopulmonary disease. Electronically Signed   By: Vinnie Langton M.D.   On: 06/27/2021 07:26    Procedures .Critical Care Performed by: Noemi Chapel, MD Authorized by: Noemi Chapel, MD   Critical care provider statement:    Critical care time (minutes):  45   Critical care time was exclusive of:  Separately billable procedures and treating other patients and teaching time   Critical care was necessary to treat or prevent imminent or life-threatening deterioration of the following conditions:  Endocrine crisis   Critical care was time spent personally by me on the following activities:  Development of treatment plan with patient or surrogate, discussions with consultants, evaluation of patient's response to treatment, examination of  patient, ordering and review of laboratory studies, ordering and review of radiographic studies, ordering and performing treatments and interventions, pulse oximetry, re-evaluation of patient's condition, review of old charts and obtaining history from patient or surrogate   I assumed direction of critical care for this patient from another provider in my specialty: no     Care discussed with: admitting provider   Comments:          Medications Ordered in ED Medications  sodium chloride 0.9 % bolus 1,000 mL (1,000 mLs Intravenous New Bag/Given 06/27/21 0807)    And  0.9 %  sodium chloride infusion (has no administration in time range)  lactated ringers bolus 1,360 mL (has no administration in time range)  insulin regular, human (MYXREDLIN) 100 units/ 100 mL infusion (has no administration in time range)  lactated ringers infusion (has no administration in time range)  dextrose 5 % in lactated ringers infusion (has no administration in time range)  dextrose 50 % solution 0-50 mL (has no administration in time range)  sodium chloride 0.9 % bolus 1,000 mL (has no administration in time range)  ondansetron (ZOFRAN) injection 4 mg (4 mg Intravenous Given 06/27/21 0806)  fentaNYL (SUBLIMAZE) injection 50 mcg (50 mcg Intravenous Given 06/27/21 1771)    ED Course/ Medical Decision Making/ A&P                           Medical Decision Making Amount and/or Complexity of Data Reviewed Labs: ordered. Radiology: ordered.  Risk Prescription drug management.   This patient presents to the ED for concern of nausea vomiting diarrhea abdominal and chest discomfort, this involves an extensive number of treatment options, and is a complaint that carries with it a high risk of complications and morbidity.  The differential diagnosis includes viral gastroenteritis, DKA, pancreatitis, cholecystitis, less likely to be acute coronary syndrome given the patient's symptoms of nausea and abdominal discomfort  that started before the chest pain.  She describes the chest pain to me  as bilateral lower chest pain on the sides which hurts when she vomits   Co morbidities that complicate the patient evaluation  Diabetes Tobacco use   Additional history obtained:  Additional history obtained from [place] record as well as the prehospital paramedic records and cardiac strips External records from outside source obtained and reviewed including prior imaging and procedures, no prior cardiac evaluation has been done   Lab Tests:  I Ordered, and personally interpreted labs.  The pertinent results include:  high glucose, low carbon dioxide all consistent with DKA, mild hyponatremia consistent with pseudohyponatremia   Imaging Studies ordered:  I ordered imaging studies including Portable chest x-ray  I independently visualized and interpreted imaging which showed no acute findings I agree with the radiologist interpretation   Cardiac Monitoring: / EKG:  The patient was maintained on a cardiac monitor.  I personally viewed and interpreted the cardiac monitored which showed an underlying rhythm of: Normal sinus rhythm   Consultations Obtained:  I requested consultation with the hospitalist,  and discussed lab and imaging findings as well as pertinent plan - they recommend: Admission to the hospital   Problem List / ED Course / Critical interventions / Medication management  DKA based on findings, hyperglycemia noted as the likely cause, the patient is now endorsing that she ran out of her medications within the last several days including both her insulin and her other medications I ordered medication including insulin drip and IV fluids for dehydration acidosis and DKA Reevaluation of the patient after these medicines showed that the patient improved I have reviewed the patients home medicines and have made adjustments as needed   Social Determinants of Health:  Has run out of  medications, diabetic   Test / Admission - Considered:  Will be admitted to the hospital, critically ill with DKA         Final Clinical Impression(s) / ED Diagnoses Final diagnoses:  None    Rx / DC Orders ED Discharge Orders     None         Noemi Chapel, MD 06/27/21 [phone]

## 2021-06-27 NOTE — Progress Notes (Signed)
Inpatient Diabetes Program Recommendations  AACE/ADA: New Consensus Statement on Inpatient Glycemic Control (2015)  Target Ranges:  Prepandial:   less than 140 mg/dL      Peak postprandial:   less than 180 mg/dL (1-2 hours)      Critically ill patients:  140 - 180 mg/dL   Lab Results  Component Value Date   GLUCAP 319 (H) 06/27/2021   HGBA1C 10.4 (A) 04/04/2021    Review of Glycemic Control  Latest Reference Range & Units 06/27/21 08:17 06/27/21 09:16 06/27/21 10:30 06/27/21 11:20  Glucose-Capillary 70 - 99 mg/dL 548 (HH) 491 (H) 430 (H) 319 (H)   Diabetes history: DM (c-peptide at the time of diagnosis  was 0.1 indicating that patient not making insulin) Outpatient Diabetes medications:  Basaglar 15 units q AM Metformin 1000 mg bid Novolog 4 units tid with meals Trulicity 3 mg weekly Current orders for Inpatient glycemic control:  IV insulin/ DKA orderset  Inpatient Diabetes Program Recommendations:    Note patient admitted today with DKA.  Agree with current orders.  Will talk to patient on 6/1 to further discuss DM management.  Will need IV insulin/IV fluids until acidosis is cleared. Once acidosis cleared, will need basal insulin 2 hours prior to d/c of insulin drip.    Thanks,  Adah Perl, RN, BC-ADM Inpatient Diabetes Coordinator Pager [phone]

## 2021-06-27 NOTE — Plan of Care (Signed)
  Problem: Education: Goal: Ability to describe self-care measures that may prevent or decrease complications (Diabetes Survival Skills Education) will improve Outcome: Progressing   Problem: Fluid Volume: Goal: Ability to maintain a balanced intake and output will improve Outcome: Progressing   Problem: Nutritional: Goal: Maintenance of adequate nutrition will improve Outcome: Progressing Goal: Progress toward achieving an optimal weight will improve Outcome: Progressing   Problem: Clinical Measurements: Goal: Cardiovascular complication will be avoided Outcome: Progressing   Problem: Clinical Measurements: Goal: Respiratory complications will improve Outcome: Progressing   Problem: Activity: Goal: Risk for activity intolerance will decrease Outcome: Progressing   Problem: Pain Managment: Goal: General experience of comfort will improve Outcome: Progressing   Problem: Elimination: Goal: Will not experience complications related to bowel motility Outcome: Progressing   Problem: Safety: Goal: Ability to remain free from injury will improve Outcome: Progressing

## 2021-06-27 NOTE — Progress Notes (Deleted)
Patient ID: Samantha Willis, female   DOB: [birth year], 46 y.o.   MRN: [id number]  HPI: Samantha Willis is a 46 y.o.-year-old female, returning for follow-up for ketosis-prone diabetes, dx in 2021,  insulin-dependent since diagnosis, uncontrolled, without long-term complications. Pt. previously saw Dr. Loanne Drilling, last visit almost 3 months ago.  Reviewed HbA1c: Lab Results  Component Value Date   HGBA1C 10.4 (A) 04/04/2021   HGBA1C 8.6 (A) 02/05/2021   HGBA1C 9.5 (A) 12/06/2020   HGBA1C 7.3 (A) 09/21/2020   HGBA1C 11.1 (A) 06/21/2020   HGBA1C 10.9 (A) 06/08/2020   HGBA1C 8.3 (A) 03/15/2020   HGBA1C 14.2 (H) 11/19/2019   HGBA1C 14.7 (H) 11/17/2019   Pt is on a regimen of: - Metformin ER 1000 mg 2x a day, with meals - Trulicity 3 mg weekly - Basaglar 15 units at bedtime - Novolog 3 units 3x a day, before meals  Dr. Loanne Drilling previously suggested an insulin pump, but she did not see the diabetes educator to discuss about this.  Pt checks her sugars *** a day and they are: - am: n/c - 2h after b'fast: n/c - before lunch: n/c - 2h after lunch: n/c - before dinner: n/c - 2h after dinner: n/c - bedtime: n/c - nighttime: n/c Lowest sugar was ***; she has hypoglycemia awareness at 70.  Highest sugar was ***.  Glucometer:***  Pt's meals are: - Breakfast: - Lunch: - Dinner: - Snacks:  - no CKD, last BUN/creatinine:  Lab Results  Component Value Date   BUN 7 06/08/2020   BUN 7 03/15/2020   CREATININE 0.74 06/08/2020   CREATININE 0.83 03/15/2020  She is not on ACE inhibitor/ARB.  -+ HL; last set of lipids: Lab Results  Component Value Date   CHOL 236 (H) 03/15/2020   HDL 85.30 03/15/2020   LDLCALC 125 (H) 03/15/2020   TRIG 133.0 03/15/2020   CHOLHDL 3 03/15/2020  She is not on a statin.  - last eye exam was in ***. No DR.   - no numbness and tingling in her feet.  Last foot exam September 21, 2020.  She also has a history of GERD, palpitations,  obesity.  ROS: + see HPI No increased urination, blurry vision, nausea, chest pain.  PE: There were no vitals taken for this visit. Wt Readings from Last 3 Encounters:  06/06/21 146 lb (66.2 kg)  04/04/21 159 lb 6.4 oz (72.3 kg)  02/05/21 165 lb 6.4 oz (75 kg)   Constitutional: normal weight, in NAD Eyes: PERRLA, EOMI, no exophthalmos ENT: moist mucous membranes, no thyromegaly, no cervical lymphadenopathy Cardiovascular: RRR, No MRG Respiratory: CTA B Musculoskeletal: no deformities, strength intact in all 4 Skin: moist, warm, no rashes Neurological: no tremor with outstretched hands, DTR normal in all 4  ASSESSMENT: 1. DM2, insulin-dependent, uncontrolled, without long-term complications, but with hyperglycemia  2. HL  PLAN:  1. Patient with long-standing, uncontrolled diabetes, on oral antidiabetic regimen, with still poor control. - I suggested to:  There are no Patient Instructions on file for this visit. - check sugars at different times of the day - check 4x a day, rotating checks - discussed about CBG targets for treatment: 80-130 mg/dL before meals and <180 mg/dL after meals; target HbA1c <7%. - given sugar log and advised how to fill it and to bring it at next appt  - given foot care handout  - given instructions for hypoglycemia management "15-15 rule"  - advised for yearly eye exams  - Return to  clinic in 3 mo with sugar log   2. HL - Reviewed latest lipid panel: LDL above target, the rest of the fractions at goal Lab Results  Component Value Date   CHOL 236 (H) 03/15/2020   HDL 85.30 03/15/2020   LDLCALC 125 (H) 03/15/2020   TRIG 133.0 03/15/2020   CHOLHDL 3 03/15/2020  -She is not on a statin -She is due for another lipid panel-plan to obtain this at next visit  Philemon Kingdom, MD PhD [facility]

## 2021-06-27 NOTE — ED Notes (Signed)
MD at bedside. 

## 2021-06-27 NOTE — ED Triage Notes (Signed)
Pt BIB EMS from home. Pt reports she was not feeling well yesterday and started vomiting last night. Pt started have central chest pain at midnight. Pt describes it as a "kicking" pain - no radiation. No cardiac hx. 12 lead with ems showed peaked t waves. Pt given 4 of zofran, 324 asa, and 1 nitro with no change in pain.  Hx of diabetes - CBG 420 VS with EMS 150/94 HR 100 RR 24  100% RA  18LAC

## 2021-06-27 NOTE — H&P (Signed)
History and Physical    Patient: Samantha Willis FOY:[id number] DOB: Apr 21, 1975 DOA: 06/27/2021 DOS: the patient was seen and examined on 06/27/2021 PCP: Isaac Bliss, Rayford Halsted, MD  Patient coming from: Home via EMS  Chief Complaint:  Chief Complaint  Patient presents with   Chest Pain   Emesis   HPI: Samantha Willis is a 46 y.o. female with medical history significant of insulin-dependent diabetes mellitus followed by Dr. Loanne Drilling in outpatient setting, Graves' disease, and obesity presents with complaints of nausea and vomiting starting yesterday evening.  She had run out of her insulin last week and notes that she had been waiting to get paid, but had a follow-up appointment scheduled with her endocrinologist today where she hopes to get refills.  Since she had run out of insulin patient reported that she had been having urinary frequency, achy discomfort with trying to urinate, and lightheadedness/dizziness.  Emesis last night was nonbloody in appearance.  She noted associated symptoms of substernal chest discomfort along with palpitations, headache, abdominal discomfort that she thought was secondary to vomiting, subjective sweats, and intermittent chills.  She smokes half pack cigarettes per day on average and does not drink wine, but does not drink on a daily basis.  Denies any prior history of alcohol withdrawals in the past..  Upon admission to the emergency department patient was seen to be afebrile with respirations 23-33 blood pressures 123/93 -165/103, and all other vital signs maintained.  Labs significant for WBC 14.6, sodium 128, potassium 5.3, CO2 less than 7, creatinine 1.5, BUN 11, anion gap unable to be calculated, and total bilirubin 1.4.  Venous blood gas and urinalysis have not been obtained yet.  Chest x-ray did not note any acute abnormality.  Patient has been given fentanyl 50 mcg IV, over 3 L of IV fluid bolus and started on insulin drip.  Urine  pregnancy was negative.  Review of Systems: As mentioned in the history of present illness. All other systems reviewed and are negative. Past Medical History:  Diagnosis Date   Anxiety    Carpal tunnel syndrome 05/06/2014    Bilateral   Diabetic ketoacidosis ([place]) 11/17/2019   GERD (gastroesophageal reflux disease)    Graves disease    Graves Disease   Heart palpitations    Obesity    Past Surgical History:  Procedure Laterality Date   Biopsy of Right Breast     At [place] Right 11/21/2015   Procedure: CARPAL TUNNEL RELEASE right;  Surgeon: Daryll Brod, MD;  Location: [facility];  Service: Orthopedics;  Laterality: Right;  FAB   CARPAL TUNNEL RELEASE Left 06/11/2016   Procedure: LEFT CARPAL TUNNEL RELEASE;  Surgeon: Daryll Brod, MD;  Location: [facility];  Service: Orthopedics;  Laterality: Left;  REG/FAB   CESAREAN SECTION     IUD REMOVAL     TUBAL LIGATION  2011   WISDOM TOOTH EXTRACTION     Social History:  reports that she quit smoking about 19 months ago. Her smoking use included cigarettes. She smoked an average of .25 packs per day. She has never used smokeless tobacco. She reports that she does not currently use alcohol. She reports that she does not use drugs.  No Known Allergies  Family History  Problem Relation Age of Onset   Heart attack Mother    Cancer Mother 40       breast   Diabetes Mother  II   Hypertension Father    Hyperlipidemia Father    Gout Father    Healthy Sister    Healthy Brother    Healthy Brother     Prior to Admission medications   Medication Sig Start Date End Date Taking? Authorizing Provider  blood glucose meter kit and supplies Dispense based on patient and insurance preference. Use up to four times daily as directed. (FOR ICD-10 E10.9, E11.9). 11/19/19   Darliss Cheney, MD  Cholecalciferol (VITAMIN D3) 2000 UNITS TABS Take 1 tablet by mouth daily.    [provider]  Continuous Blood Gluc Receiver (FREESTYLE LIBRE 14 DAY READER) DEVI 1 each by Does not apply route daily. 03/15/20   Isaac Bliss, Rayford Halsted, MD  Continuous Blood Gluc Sensor (FREESTYLE LIBRE 2 SENSOR) MISC 1 Device by Does not apply route every 14 (fourteen) days. 09/21/20   Renato Shin, MD  diphenhydrAMINE (BENADRYL) 25 MG tablet Take 25 mg by mouth every 6 (six) hours as needed.    [provider]  ibuprofen (ADVIL,MOTRIN) 200 MG tablet Take 200 mg by mouth every 8 (eight) hours as needed.    [provider]  Insulin Glargine (BASAGLAR KWIKPEN) 100 UNIT/ML Inject 15 Units into the skin every morning. 04/04/21   Renato Shin, MD  Insulin Pen Needle 32G X 4 MM MISC Use daily for glucose control 12/15/19   Isaac Bliss, Rayford Halsted, MD  Insulin Syringe-Needle U-100 27G X 1/2" 1 ML MISC Use daily for glucose control.  DxE11.9 01/05/20   Isaac Bliss, Rayford Halsted, MD  metFORMIN (GLUCOPHAGE-XR) 500 MG 24 hr tablet Take 2 tablets (1,000 mg total) by mouth daily. 02/05/21   Renato Shin, MD  norethindrone (MICRONOR) 0.35 MG tablet Take 1 tablet (0.35 mg total) by mouth daily. 06/06/21   Princess Bruins, MD  NOVOLOG FLEXPEN 100 UNIT/ML FlexPen SMARTSIG:4 Unit(s) SUB-Q 3 Times Daily 01/11/21   [provider]  sertraline (ZOLOFT) 100 MG tablet Take 1 tablet (100 mg total) by mouth daily. APPT NEEDED FOR FUTURE REFILLS 02/06/21   Isaac Bliss, Rayford Halsted, MD  sertraline (ZOLOFT) 50 MG tablet TAKE 1 TABLET BY MOUTH EVERY DAY 03/28/20   Isaac Bliss, Rayford Halsted, MD  TRULICITY 3 MA/0.0KH SOPN Inject into the skin. 05/29/21   [provider]    Physical Exam: Vitals:   06/27/21 0745 06/27/21 0800 06/27/21 0815 06/27/21 0915  BP: (!) 147/127 (!) 123/93  (!) 153/85  Pulse:  73  90  Resp: (!) 26 19  (!) 25  Temp:      TempSrc:      SpO2:  99%  100%  Weight:   68 kg      Constitutional: Middle-aged female who appears to be ill Eyes: PERRL, lids and  conjunctivae normal ENMT: Mucous membranes are dry. Posterior pharynx clear of any exudate or lesions.  Neck: normal, supple, no masses, no thyromegaly Respiratory: clear to auscultation bilaterally, no wheezing, no crackles. Normal respiratory effort. No accessory muscle use.  Cardiovascular: Regular rate and rhythm, no murmurs / rubs / gallops. No extremity edema. Abdomen: Mild lower abdominal tenderness. Musculoskeletal: no clubbing / cyanosis. No joint deformity upper and lower extremities. Good ROM, no contractures. Normal muscle tone.  Skin: no rashes, lesions, ulcers. No induration Neurologic: CN 2-12 grossly intact. Sensation intact, DTR normal. Strength 5/5 in all 4.  Psychiatric: Normal judgment and insight. Alert and oriented x 3. Normal mood.   Data Reviewed:  EKG reveals sinus rhythm at 84  bpm with prolonged QT interval 560  Assessment and Plan: Severe DKA, type II Patient presents with glucose of 511 with CO2 less than 7 and anion gap unable to be calculated.  She recently ran out of insulin.  Last available hemoglobin A1c 10.4 on 04/04/2021.  Home medication regimen includes metformin XR 1000 mg twice daily, and glargine 15 units daily, NovoLog 4 units 3 times daily with meals and Trulicity.  Venous pH was able to be obtained was noted to be 6.876. -Admit to a progressive bed -Hyperglycemia order set utilized -Hold metformin -Continue insulin drip per protocol -Switch IV fluids to sodium bicarb at 174m/h -Serial BMP and beta hydroxybutyrate -Transition to subcu insulin once medically appropriate   Metabolic acidosis with increased anion gap Acute.  On admission CO2 was less than 7 with anion gap unable to be calculated. -Sodium bicarb drip as noted above  -Continue to monitor  Leukocytosis Acute.  WBC elevated at 14.6.  Chest x-ray did not note any acute abnormality.  Urinalysis was noted to be abnormal. -Check urine culture -Recheck CBC tomorrow morning  Abnormal  urinalysis/Possible UTI Acute.  Patient reported having urinary frequency we will which was thought secondary to elevated blood sugars as well as some acute discomfort.  Urinalysis noted greater than 500 glucose, large hemoglobin, 80 ketones, rare bacteria, and greater than 50 WBCs and 11-20 WBCs. -Check urine culture -Empiric antibiotics of Rocephin IV  Hyperkalemia Acute.  Potassium 5.3.  EKG noted T wave abnormalities but appear to be more so chronic in nature.  Patient has been given IV fluids and started on insulin drip. -Continue to monitor  Acute kidney injury On admission creatinine elevated up to 1.5 with BUN 11.  Baseline creatinine previously noted to be 0.74.  Patient still able to urinate at this time. -IV fluids as noted above -Continue to monitor kidney function  Prolonged QT interval Acute.  On admission QTc was 560. -Avoid QT prolonging medications  Hyponatremia Acute.  Sodium 131 on admission.  When adjusted for patient's hyperglycemia sodium levels appear to be within normal limits.  Anxiety Home medication regimen includes sertraline. -Hold sertraline due to prolonged QT  Tobacco abuse Patient reports smoking half pack of cigarettes per day on average currently. -Nicotine patch offered -Continue to counsel on need for cessation of tobacco use  Alcohol use Patient reports drinking wine, but not on a daily basis.  Denies any history of withdrawals.  DVT prophylaxis: Lovenox Advance Care Planning:   Code Status: Full Code  Consults: Diabetic education  Family Communication: Husband updated at bedside  Severity of Illness: The appropriate patient status for this patient is INPATIENT. Inpatient status is judged to be reasonable and necessary in order to provide the required intensity of service to ensure the patient's safety. The patient's presenting symptoms, physical exam findings, and initial radiographic and laboratory data in the context of their chronic  comorbidities is felt to place them at high risk for further clinical deterioration. Furthermore, it is not anticipated that the patient will be medically stable for discharge from the hospital within 2 midnights of admission.   * I certify that at the point of admission it is my clinical judgment that the patient will require inpatient hospital care spanning beyond 2 midnights from the point of admission due to high intensity of service, high risk for further deterioration and high frequency of surveillance required.*  Author: RNorval Morton MD 06/27/2021 9:23 AM  For on call review [email or web address]

## 2021-06-28 ENCOUNTER — Other Ambulatory Visit (HOSPITAL_COMMUNITY): Payer: Self-pay

## 2021-06-28 DIAGNOSIS — N179 Acute kidney failure, unspecified: Secondary | ICD-10-CM | POA: Diagnosis not present

## 2021-06-28 DIAGNOSIS — E1110 Type 2 diabetes mellitus with ketoacidosis without coma: Secondary | ICD-10-CM | POA: Diagnosis not present

## 2021-06-28 DIAGNOSIS — Z8639 Personal history of other endocrine, nutritional and metabolic disease: Secondary | ICD-10-CM | POA: Diagnosis not present

## 2021-06-28 DIAGNOSIS — R8290 Unspecified abnormal findings in urine: Secondary | ICD-10-CM | POA: Diagnosis not present

## 2021-06-28 LAB — BASIC METABOLIC PANEL
Anion gap: 12 (ref 5–15)
Anion gap: 10 (ref 5–15)
Anion gap: 15 (ref 5–15)
Anion gap: 14 (ref 5–15)
Anion gap: 11 (ref 5–15)
Anion gap: 10 (ref 5–15)
BUN: 13 mg/dL (ref 6–20)
BUN: 12 mg/dL (ref 6–20)
BUN: 13 mg/dL (ref 6–20)
BUN: 12 mg/dL (ref 6–20)
BUN: 9 mg/dL (ref 6–20)
BUN: 7 mg/dL (ref 6–20)
CO2: 18 mmol/L — ABNORMAL LOW (ref 22–32)
CO2: 18 mmol/L — ABNORMAL LOW (ref 22–32)
CO2: 17 mmol/L — ABNORMAL LOW (ref 22–32)
CO2: 20 mmol/L — ABNORMAL LOW (ref 22–32)
CO2: 23 mmol/L (ref 22–32)
CO2: 22 mmol/L (ref 22–32)
Calcium: 8.4 mg/dL — ABNORMAL LOW (ref 8.9–10.3)
Calcium: 8.3 mg/dL — ABNORMAL LOW (ref 8.9–10.3)
Calcium: 7.8 mg/dL — ABNORMAL LOW (ref 8.9–10.3)
Calcium: 7.9 mg/dL — ABNORMAL LOW (ref 8.9–10.3)
Calcium: 7.8 mg/dL — ABNORMAL LOW (ref 8.9–10.3)
Calcium: 7.7 mg/dL — ABNORMAL LOW (ref 8.9–10.3)
Chloride: 108 mmol/L (ref 98–111)
Chloride: 105 mmol/L (ref 98–111)
Chloride: 103 mmol/L (ref 98–111)
Chloride: 101 mmol/L (ref 98–111)
Chloride: 100 mmol/L (ref 98–111)
Chloride: 102 mmol/L (ref 98–111)
Creatinine, Ser: 1.34 mg/dL — ABNORMAL HIGH (ref 0.44–1.00)
Creatinine, Ser: 1.3 mg/dL — ABNORMAL HIGH (ref 0.44–1.00)
Creatinine, Ser: 1.26 mg/dL — ABNORMAL HIGH (ref 0.44–1.00)
Creatinine, Ser: 1.24 mg/dL — ABNORMAL HIGH (ref 0.44–1.00)
Creatinine, Ser: 1.24 mg/dL — ABNORMAL HIGH (ref 0.44–1.00)
Creatinine, Ser: 1.01 mg/dL — ABNORMAL HIGH (ref 0.44–1.00)
GFR, Estimated: 50 mL/min — ABNORMAL LOW (ref >60)
GFR, Estimated: 52 mL/min — ABNORMAL LOW (ref >60)
GFR, Estimated: 54 mL/min — ABNORMAL LOW (ref >60)
GFR, Estimated: 55 mL/min — ABNORMAL LOW (ref >60)
GFR, Estimated: 55 mL/min — ABNORMAL LOW (ref >60)
GFR, Estimated: >60 mL/min (ref >60)
Glucose, Bld: 133 mg/dL — ABNORMAL HIGH (ref 70–99)
Glucose, Bld: 161 mg/dL — ABNORMAL HIGH (ref 70–99)
Glucose, Bld: 180 mg/dL — ABNORMAL HIGH (ref 70–99)
Glucose, Bld: 179 mg/dL — ABNORMAL HIGH (ref 70–99)
Glucose, Bld: 177 mg/dL — ABNORMAL HIGH (ref 70–99)
Glucose, Bld: 135 mg/dL — ABNORMAL HIGH (ref 70–99)
Potassium: 3.5 mmol/L (ref 3.5–5.1)
Potassium: 2.9 mmol/L — ABNORMAL LOW (ref 3.5–5.1)
Potassium: 3.2 mmol/L — ABNORMAL LOW (ref 3.5–5.1)
Potassium: 2.9 mmol/L — ABNORMAL LOW (ref 3.5–5.1)
Potassium: 2.9 mmol/L — ABNORMAL LOW (ref 3.5–5.1)
Potassium: 3.4 mmol/L — ABNORMAL LOW (ref 3.5–5.1)
Sodium: 138 mmol/L (ref 135–145)
Sodium: 133 mmol/L — ABNORMAL LOW (ref 135–145)
Sodium: 135 mmol/L (ref 135–145)
Sodium: 135 mmol/L (ref 135–145)
Sodium: 134 mmol/L — ABNORMAL LOW (ref 135–145)
Sodium: 134 mmol/L — ABNORMAL LOW (ref 135–145)

## 2021-06-28 LAB — GLUCOSE, CAPILLARY
Glucose-Capillary: 127 mg/dL — ABNORMAL HIGH (ref 70–99)
Glucose-Capillary: 132 mg/dL — ABNORMAL HIGH (ref 70–99)
Glucose-Capillary: 138 mg/dL — ABNORMAL HIGH (ref 70–99)
Glucose-Capillary: 144 mg/dL — ABNORMAL HIGH (ref 70–99)
Glucose-Capillary: 151 mg/dL — ABNORMAL HIGH (ref 70–99)
Glucose-Capillary: 157 mg/dL — ABNORMAL HIGH (ref 70–99)
Glucose-Capillary: 162 mg/dL — ABNORMAL HIGH (ref 70–99)
Glucose-Capillary: 165 mg/dL — ABNORMAL HIGH (ref 70–99)
Glucose-Capillary: 165 mg/dL — ABNORMAL HIGH (ref 70–99)
Glucose-Capillary: 169 mg/dL — ABNORMAL HIGH (ref 70–99)
Glucose-Capillary: 171 mg/dL — ABNORMAL HIGH (ref 70–99)
Glucose-Capillary: 175 mg/dL — ABNORMAL HIGH (ref 70–99)
Glucose-Capillary: 181 mg/dL — ABNORMAL HIGH (ref 70–99)
Glucose-Capillary: 181 mg/dL — ABNORMAL HIGH (ref 70–99)
Glucose-Capillary: 196 mg/dL — ABNORMAL HIGH (ref 70–99)
Glucose-Capillary: 200 mg/dL — ABNORMAL HIGH (ref 70–99)
Glucose-Capillary: 202 mg/dL — ABNORMAL HIGH (ref 70–99)
Glucose-Capillary: 204 mg/dL — ABNORMAL HIGH (ref 70–99)

## 2021-06-28 LAB — URINE CULTURE

## 2021-06-28 LAB — BETA-HYDROXYBUTYRIC ACID
Beta-Hydroxybutyric Acid: 1.67 mmol/L — ABNORMAL HIGH (ref 0.05–0.27)
Beta-Hydroxybutyric Acid: 5.3 mmol/L — ABNORMAL HIGH (ref 0.05–0.27)
Beta-Hydroxybutyric Acid: 2.91 mmol/L — ABNORMAL HIGH (ref 0.05–0.27)

## 2021-06-28 LAB — MAGNESIUM
Magnesium: 1.4 mg/dL — ABNORMAL LOW (ref 1.7–2.4)
Magnesium: 2.7 mg/dL — ABNORMAL HIGH (ref 1.7–2.4)

## 2021-06-28 MED ORDER — MAGNESIUM SULFATE IN D5W 1-5 GM/100ML-% IV SOLN
1.0000 g | Freq: Once | INTRAVENOUS | Status: DC
  Filled 2021-06-28: qty 100

## 2021-06-28 MED ORDER — POTASSIUM CHLORIDE 10 MEQ/100ML IV SOLN
10.0000 meq | INTRAVENOUS | Status: AC
  Administered 2021-06-28 (×2): 10 meq via INTRAVENOUS
  Filled 2021-06-28 (×2): qty 100

## 2021-06-28 MED ORDER — ALUM & MAG HYDROXIDE-SIMETH 200-200-20 MG/5ML PO SUSP
30.0000 mL | Freq: Four times a day (QID) | ORAL | Status: DC | PRN
  Administered 2021-06-28 – 2021-06-29 (×2): 30 mL via ORAL
  Filled 2021-06-28 (×2): qty 30

## 2021-06-28 MED ORDER — INSULIN GLARGINE-YFGN 100 UNIT/ML ~~LOC~~ SOLN
20.0000 [IU] | SUBCUTANEOUS | Status: DC
  Administered 2021-06-28 – 2021-06-29 (×2): 20 [IU] via SUBCUTANEOUS
  Filled 2021-06-28 (×2): qty 0.2

## 2021-06-28 MED ORDER — MAGNESIUM SULFATE 4 GM/100ML IV SOLN
4.0000 g | Freq: Once | INTRAVENOUS | Status: AC
  Administered 2021-06-28: 4 g via INTRAVENOUS
  Filled 2021-06-28: qty 100

## 2021-06-28 MED ORDER — POTASSIUM CHLORIDE CRYS ER 20 MEQ PO TBCR
20.0000 meq | EXTENDED_RELEASE_TABLET | Freq: Once | ORAL | Status: AC
Start: 2021-06-28 — End: 2021-06-28
  Administered 2021-06-28: 20 meq via ORAL
  Filled 2021-06-28: qty 1

## 2021-06-28 MED ORDER — POTASSIUM CHLORIDE IN NACL 20-0.9 MEQ/L-% IV SOLN
INTRAVENOUS | Status: DC
  Filled 2021-06-28 (×4): qty 1000

## 2021-06-28 MED ORDER — POTASSIUM CHLORIDE CRYS ER 20 MEQ PO TBCR
40.0000 meq | EXTENDED_RELEASE_TABLET | ORAL | Status: AC
  Administered 2021-06-28 (×2): 40 meq via ORAL
  Filled 2021-06-28 (×2): qty 2

## 2021-06-28 MED ORDER — POTASSIUM CHLORIDE CRYS ER 20 MEQ PO TBCR
20.0000 meq | EXTENDED_RELEASE_TABLET | Freq: Once | ORAL | Status: AC
  Administered 2021-06-28: 20 meq via ORAL
  Filled 2021-06-28: qty 1

## 2021-06-28 MED ORDER — POTASSIUM CHLORIDE CRYS ER 20 MEQ PO TBCR
40.0000 meq | EXTENDED_RELEASE_TABLET | ORAL | Status: AC
  Administered 2021-06-28 – 2021-06-29 (×2): 40 meq via ORAL
  Filled 2021-06-28 (×2): qty 2

## 2021-06-28 MED ORDER — INSULIN ASPART 100 UNIT/ML IJ SOLN
0.0000 [IU] | INTRAMUSCULAR | Status: DC
  Administered 2021-06-28: 3 [IU] via SUBCUTANEOUS
  Administered 2021-06-28 – 2021-06-29 (×2): 2 [IU] via SUBCUTANEOUS

## 2021-06-28 NOTE — Care Management (Signed)
  Transition of Care [facility]) Screening Note   Patient Details  Name: Samantha Willis Date of Birth: 04/06/1975   Transition of [facility]) CM/SW Contact:    Bethena Roys, RN Phone Number: 06/28/2021, 1:39 PM    Transition of Care [facility]) has reviewed the patient and no TOC needs have been identified at this time. We will continue to monitor patient advancement through interdisciplinary progression rounds. If new patient transition needs arise, please place a TOC consult.

## 2021-06-28 NOTE — TOC Benefit Eligibility Note (Signed)
Patient Teacher, English as a foreign language completed.    The patient is currently admitted and upon discharge could be taking Basaglar Pen.  The current 30 day co-pay is, $94.92.   The patient is currently admitted and upon discharge could be taking Novolog Pen.  The current 30 day co-pay is, $162.50.   The patient is currently admitted and upon discharge could be taking Levemir Pen.  The current 30 day co-pay is, $134.41.    The patient is insured through [place] Patient Advocate Specialist [place] Patient Advocate Team Direct Number: [phone]  Fax: [phone]

## 2021-06-28 NOTE — Progress Notes (Signed)
K=2.9, Notified T.Opyd, MD with orders made and carried out to replace K and continue insulin drip.

## 2021-06-28 NOTE — Progress Notes (Signed)
PROGRESS NOTE    Samantha Willis  ZOX:[id number] DOB: 1975/07/04 DOA: 06/27/2021 PCP: Isaac Bliss, Rayford Halsted, MD  Outpatient Specialists:     Brief Narrative:  Patient is a 46 year old African-American female with past medical history significant for diabetes mellitus on insulin, Graves' disease and obesity.  Patient was admitted in DKA.  Apparently, patient had not been compliant with her insulin regimen.  Patient has been on management for DKA.  Anion gap is closed.  Patient has been started on transition protocol.   Assessment & Plan:   Principal Problem:   DKA (diabetic ketoacidosis) ([place]) Active Problems:   History of metabolic acidosis with increased anion gap   Leukocytosis   Abnormal urinalysis   Hyperkalemia   AKI (acute kidney injury) ([place])   Prolonged QT interval   Severe DKA, type II Patient presents with glucose of 511 with CO2 less than 7 and anion gap unable to be calculated.  She recently ran out of insulin.  Last available hemoglobin A1c 10.4 on 04/04/2021.  Home medication regimen includes metformin XR 1000 mg twice daily, and glargine 15 units daily, NovoLog 4 units 3 times daily with meals and Trulicity.  Venous pH was able to be obtained was noted to be 6.876. -Admit to a progressive bed -Hyperglycemia order set utilized -Hold metformin -Continue insulin drip per protocol -Switch IV fluids to sodium bicarb at 19m/h -Serial BMP and beta hydroxybutyrate -Transition to subcu insulin once medically appropriate  06/28/2021: And gap is closed.  We will start transition protocol.  Abnormal electrolytes have been monitored and corrected.   Metabolic acidosis with increased anion gap Acute.  On admission CO2 was less than 7 with anion gap unable to be calculated. -Sodium bicarb drip as noted above  -Continue to monitor 06/28/2021: Anion gap is closed.   Leukocytosis Acute.  WBC elevated at 14.6.  Chest x-ray did not note any acute abnormality.   Urinalysis was noted to be abnormal. -Check urine culture -Recheck CBC tomorrow morning 06/28/2021: No new WBC today.  We will check CBC in the morning.   Abnormal urinalysis/Possible UTI Acute.  Patient reported having urinary frequency we will which was thought secondary to elevated blood sugars as well as some acute discomfort.  Urinalysis noted greater than 500 glucose, large hemoglobin, 80 ketones, rare bacteria, and greater than 50 WBCs and 11-20 WBCs. -Check urine culture -Empiric antibiotics of Rocephin IV 06/28/2021: Follow cultures.  Continue antibiotics for now.   Hyperkalemia/hypokalemia: Acute.  Potassium 5.3.  EKG noted T wave abnormalities but appear to be more so chronic in nature.  Patient has been given IV fluids and started on insulin drip. -Continue to monitor 06/28/2021: Patient has remained largely hypokalemic.  Continue to monitor and replete.  Follow magnesium level.   Acute kidney injury On admission creatinine elevated up to 1.5 with BUN 11.  Baseline creatinine previously noted to be 0.74.  Patient still able to urinate at this time. -IV fluids as noted above -Continue to monitor kidney function 06/28/2021: Slowly improving.   Prolonged QT interval Acute.  On admission QTc was 560. -Avoid QT prolonging medications 06/28/2021: Recheck EKG after electrolytes have been corrected.   Hyponatremia Acute.  Sodium 131 on admission.  When adjusted for patient's hyperglycemia sodium levels appear to be within normal limits. 06/28/2021: Volume related.  Slowly resolving.  Last sodium was 134.   Anxiety Home medication regimen includes sertraline. -Hold sertraline due to prolonged QT   Tobacco abuse Patient reports smoking half pack  of cigarettes per day on average currently. -Nicotine patch offered -Continue to counsel on need for cessation of tobacco use   Alcohol use Patient reports drinking wine, but not on a daily basis.  Denies any history of withdrawals.     DVT  prophylaxis: Subcutaneous left lower Code Status: Full code Family Communication:  Disposition Plan: Likely discharge home in the next 1 to 2 days   Consultants:  None  Procedures:  None  Antimicrobials:  IV Rocephin.   Subjective: No fever or chills. No nausea or vomiting. No chest pain. No shortness of breath.  Objective: Vitals:   06/27/21 2019 06/28/21 0014 06/28/21 0321 06/28/21 0824  BP: 107/77 109/69 96/68   Pulse: 66 83 82   Resp: '19 20 20   '$ Temp: 98.5 F (36.9 C) 98.1 F (36.7 C) 98.7 F (37.1 C) 98.9 F (37.2 C)  TempSrc: Oral Axillary Oral Oral  SpO2: 99% 95% 100%   Weight:        Intake/Output Summary (Last 24 hours) at 06/28/2021 1555 Last data filed at 06/28/2021 1425 Gross per 24 hour  Intake 3607.77 ml  Output 1100 ml  Net 2507.77 ml   Filed Weights   06/27/21 0815  Weight: 68 kg    Examination:  General exam: Appears calm and comfortable  Respiratory system: Clear to auscultation.  Cardiovascular system: S1 & S2 heard. Gastrointestinal system: Abdomen is nondistended, soft and nontender. No organomegaly or masses felt. Normal bowel sounds heard. Central nervous system: Alert and oriented. No focal neurological deficits. Extremities: No leg edema.  Data Reviewed: I have personally reviewed following labs and imaging studies  CBC: Recent Labs  Lab 06/27/21 0653 06/27/21 0947  WBC 14.6*  --   NEUTROABS 11.7*  --   HGB 14.4 16.7*  HCT 44.9 49.0*  MCV 108.7*  --   PLT 331  --    Basic Metabolic Panel: Recent Labs  Lab 06/27/21 2013 06/28/21 0049 06/28/21 0422 06/28/21 0813 06/28/21 1149  NA 135 138 133* 135 135  K 4.1 3.5 2.9* 3.2* 2.9*  CL 109 108 105 103 101  CO2 12* 18* 18* 17* 20*  GLUCOSE 223* 133* 161* 180* 179*  BUN '12 13 12 13 12  '$ CREATININE 1.35* 1.34* 1.30* 1.26* 1.24*  CALCIUM 7.9* 8.4* 8.3* 7.8* 7.9*  MG  --   --  1.4*  --   --    GFR: Estimated Creatinine Clearance: 54 mL/min (A) (by C-G formula based on  SCr of 1.24 mg/dL (H)). Liver Function Tests: Recent Labs  Lab 06/27/21 0653  AST 24  ALT 26  ALKPHOS 92  BILITOT 1.4*  PROT 8.9*  ALBUMIN 4.5   Recent Labs  Lab 06/27/21 0653  LIPASE 29   No results for input(s): AMMONIA in the last 168 hours. Coagulation Profile: No results for input(s): INR, PROTIME in the last 168 hours. Cardiac Enzymes: No results for input(s): CKTOTAL, CKMB, CKMBINDEX, TROPONINI in the last 168 hours. BNP (last 3 results) No results for input(s): PROBNP in the last 8760 hours. HbA1C: No results for input(s): HGBA1C in the last 72 hours. CBG: Recent Labs  Lab 06/28/21 1106 06/28/21 1215 06/28/21 1318 06/28/21 1418 06/28/21 1448  GLUCAP 175* 202* 181* 165* 165*   Lipid Profile: No results for input(s): CHOL, HDL, LDLCALC, TRIG, CHOLHDL, LDLDIRECT in the last 72 hours. Thyroid Function Tests: No results for input(s): TSH, T4TOTAL, FREET4, T3FREE, THYROIDAB in the last 72 hours. Anemia Panel: No results for input(s): VITAMINB12,  FOLATE, FERRITIN, TIBC, IRON, RETICCTPCT in the last 72 hours. Urine analysis:    Component Value Date/Time   COLORURINE YELLOW 06/27/2021 0917   APPEARANCEUR CLEAR 06/27/2021 0917   LABSPEC 1.021 06/27/2021 0917   PHURINE 5.0 06/27/2021 0917   GLUCOSEU >=500 (A) 06/27/2021 0917   HGBUR LARGE (A) 06/27/2021 0917   BILIRUBINUR NEGATIVE 06/27/2021 0917   KETONESUR 80 (A) 06/27/2021 0917   PROTEINUR 100 (A) 06/27/2021 0917   UROBILINOGEN 0.2 11/25/2009 2141   NITRITE NEGATIVE 06/27/2021 0917   LEUKOCYTESUR NEGATIVE 06/27/2021 0917   Sepsis Labs: '@LABRCNTIP'$ (procalcitonin:4,lacticidven:4)  ) Recent Results (from the past 240 hour(s))  Urine Culture     Status: Abnormal   Collection Time: 06/27/21 11:37 AM   Specimen: Urine, Clean Catch  Result Value Ref Range Status   Specimen Description URINE, CLEAN CATCH  Final   Special Requests   Final    NONE Performed at [facility], [place] [address]  [place], [place] [address]    Culture MULTIPLE SPECIES PRESENT, SUGGEST RECOLLECTION (A)  Final   Report Status 06/28/2021 FINAL  Final         Radiology Studies: DG Chest Portable 1 View  Result Date: 06/27/2021 CLINICAL DATA:  46 year old female with history of chest pain and emesis. Shortness of breath. EXAM: PORTABLE CHEST 1 VIEW COMPARISON:  Chest x-ray 11/17/2019. FINDINGS: Lung volumes are normal. No consolidative airspace disease. No pleural effusions. No pneumothorax. No pulmonary nodule or mass noted. Pulmonary vasculature and the cardiomediastinal silhouette are within normal limits. IMPRESSION: No radiographic evidence of acute cardiopulmonary disease. Electronically Signed   By: Vinnie Langton M.D.   On: 06/27/2021 07:26        Scheduled Meds:  enoxaparin (LOVENOX) injection  40 mg Subcutaneous Q24H   insulin aspart  0-15 Units Subcutaneous Q4H   insulin glargine-yfgn  20 Units Subcutaneous Q24H   nicotine  14 mg Transdermal Daily   potassium chloride  40 mEq Oral Q4H   Continuous Infusions:  0.9 % NaCl with KCl 20 mEq / L 125 mL/hr at 06/28/21 1450   cefTRIAXone (ROCEPHIN)  IV 1 g (06/28/21 1346)     LOS: 1 day    Time spent: 55 minutes    Dana Allan, MD  [facility] Pager #: [phone] 7PM-7AM contact night coverage as above

## 2021-06-28 NOTE — Progress Notes (Signed)
Endotool therapy mode shifted from DKA to Hyperglycemia due to Anion gap of 10. Informed T. Opyd, MD. No order made.

## 2021-06-28 NOTE — Progress Notes (Signed)
Inpatient Diabetes Program Recommendations  AACE/ADA: New Consensus Statement on Inpatient Glycemic Control (2015)  Target Ranges:  Prepandial:   less than 140 mg/dL      Peak postprandial:   less than 180 mg/dL (1-2 hours)      Critically ill patients:  140 - 180 mg/dL   Lab Results  Component Value Date   GLUCAP 181 (H) 06/28/2021   HGBA1C 10.4 (A) 04/04/2021    Review of Glycemic Control  Diabetes history: type 2?? Outpatient Diabetes medications: Novolog 15 units daily, Metformin 5726 mg BID, Trulicity 3 OM/3.5 ml weekly (patient had stopped taking Basaglar per doctor's order about 1 year ago) Current orders for Inpatient glycemic control: IV insulin drip, transition to: Semglee 20 units daily, Novolog 0-15 units every 4 hours.  Inpatient Diabetes Program Recommendations:   Spoke with patient at the bedside. Patient was diagnosed with diabetes in 2021. She was prescribed Basaglar 32 units daily and Novolog at the that time. The Basaglar was discontinued about 1 year ago. Novolog 15 units daily continued, but patient ran out of Novolog about 5 days ago. Had an appointment with Dr. Loanne Drilling today, but was told that he had retired. She was given another appointment with a different endocrinologist that she will follow up with after discharge.   Reviewed HgbA1C of 10.4%. states that her A1C has been lower than this. Checks her blood sugars once per day. Suggested that she check her blood sugars at least twice a day and record so that she can take report to her new physician.   A benefit check was done on different insulins to see what her insurance would cover. Basaglar is $94.92 copay, but can get a coupon card that would make it $35. Novolog is still preferred by her insurance.   Will continue to monitor blood sugars while in the hospital.  Harvel Ricks RN BSN CDE Diabetes Coordinator Pager: [phone]  8am-5pm

## 2021-06-29 ENCOUNTER — Encounter (HOSPITAL_COMMUNITY): Payer: Self-pay | Admitting: Internal Medicine

## 2021-06-29 LAB — CBC WITH DIFFERENTIAL/PLATELET
Abs Immature Granulocytes: 0.06 10*3/uL (ref 0.00–0.07)
Basophils Absolute: 0 10*3/uL (ref 0.0–0.1)
Basophils Relative: 0 %
Eosinophils Absolute: 0 10*3/uL (ref 0.0–0.5)
Eosinophils Relative: 0 %
HCT: 30.8 % — ABNORMAL LOW (ref 36.0–46.0)
Hemoglobin: 11 g/dL — ABNORMAL LOW (ref 12.0–15.0)
Immature Granulocytes: 1 %
Lymphocytes Relative: 22 %
Lymphs Abs: 2.6 10*3/uL (ref 0.7–4.0)
MCH: 34.8 pg — ABNORMAL HIGH (ref 26.0–34.0)
MCHC: 35.7 g/dL (ref 30.0–36.0)
MCV: 97.5 fL (ref 80.0–100.0)
Monocytes Absolute: 1.1 10*3/uL — ABNORMAL HIGH (ref 0.1–1.0)
Monocytes Relative: 10 %
Neutro Abs: 8.1 10*3/uL — ABNORMAL HIGH (ref 1.7–7.7)
Neutrophils Relative %: 67 %
Platelets: 246 10*3/uL (ref 150–400)
RBC: 3.16 MIL/uL — ABNORMAL LOW (ref 3.87–5.11)
RDW: 12.6 % (ref 11.5–15.5)
WBC: 12 10*3/uL — ABNORMAL HIGH (ref 4.0–10.5)
nRBC: 0 % (ref 0.0–0.2)

## 2021-06-29 LAB — BASIC METABOLIC PANEL
Anion gap: 9 (ref 5–15)
Anion gap: 6 (ref 5–15)
BUN: 5 mg/dL — ABNORMAL LOW (ref 6–20)
BUN: <5 mg/dL — ABNORMAL LOW (ref 6–20)
CO2: 23 mmol/L (ref 22–32)
CO2: 22 mmol/L (ref 22–32)
Calcium: 7.7 mg/dL — ABNORMAL LOW (ref 8.9–10.3)
Calcium: 7.4 mg/dL — ABNORMAL LOW (ref 8.9–10.3)
Chloride: 104 mmol/L (ref 98–111)
Chloride: 108 mmol/L (ref 98–111)
Creatinine, Ser: 0.79 mg/dL (ref 0.44–1.00)
Creatinine, Ser: 0.83 mg/dL (ref 0.44–1.00)
GFR, Estimated: >60 mL/min (ref >60)
GFR, Estimated: >60 mL/min (ref >60)
Glucose, Bld: 99 mg/dL (ref 70–99)
Glucose, Bld: 124 mg/dL — ABNORMAL HIGH (ref 70–99)
Potassium: 3.7 mmol/L (ref 3.5–5.1)
Potassium: 4.2 mmol/L (ref 3.5–5.1)
Sodium: 136 mmol/L (ref 135–145)
Sodium: 136 mmol/L (ref 135–145)

## 2021-06-29 LAB — BETA-HYDROXYBUTYRIC ACID: Beta-Hydroxybutyric Acid: 1.84 mmol/L — ABNORMAL HIGH (ref 0.05–0.27)

## 2021-06-29 LAB — GLUCOSE, CAPILLARY
Glucose-Capillary: 113 mg/dL — ABNORMAL HIGH (ref 70–99)
Glucose-Capillary: 113 mg/dL — ABNORMAL HIGH (ref 70–99)
Glucose-Capillary: 127 mg/dL — ABNORMAL HIGH (ref 70–99)
Glucose-Capillary: 98 mg/dL (ref 70–99)

## 2021-06-29 LAB — MAGNESIUM: Magnesium: 2.6 mg/dL — ABNORMAL HIGH (ref 1.7–2.4)

## 2021-06-29 MED ORDER — BASAGLAR KWIKPEN 100 UNIT/ML ~~LOC~~ SOPN: 20.0000 [IU] | PEN_INJECTOR | Freq: Every day | SUBCUTANEOUS | 3 refills | Status: DC

## 2021-06-29 MED ORDER — NOVOLOG FLEXPEN 100 UNIT/ML ~~LOC~~ SOPN: PEN_INJECTOR | SUBCUTANEOUS | 11 refills | Status: DC

## 2021-06-29 NOTE — Progress Notes (Addendum)
Latest Reference Range & Units 06/28/21 17:27 06/28/21 20:51 06/29/21 00:00 06/29/21 04:32 06/29/21 08:03  Glucose-Capillary 70 - 99 mg/dL 171 (H) 127 (H) 113 (H) 113 (H) 127 (H)  (H): Data is abnormally high  Noted that patient is eating now. Recommend changing Novolog correction scale to 0-15 units TID and add Novolog 0-5 units HS scale.  ADDENDUM: spoke to patient about her copays for insulin. States that she can get [employer], [employer] for $25 [facility]. States that she has 3 more pens left from last prescription.Needs Novolog Flexpen prescription at discharge.  Maxie Better order # [id number] Tobie Lords order # [id number]   Discussed her dosages at home. She was to see her new endocrinologist this week, but was in the hospital. In reference to her doses here at the hospital, recommend Basaglar 20 units daily, Novolog 5 units TID with meals at discharge until she can get back to her new endocrinologist.  Continues to take Trulicity 3 mg weekly.   Harvel Ricks RN BSN CDE Diabetes Coordinator Pager: [phone]  8am-5pm

## 2021-06-29 NOTE — Discharge Summary (Incomplete)
Physician Discharge Summary  Patient ID: Samantha Willis MRN: [id number] DOB/AGE: 08/22/1975 46 y.o.  Admit date: 06/27/2021 Discharge date: 06/29/2021  Admission Diagnoses:  Discharge Diagnoses:  Principal Problem:   DKA (diabetic ketoacidosis) (HCC) Active Problems:   AKI (acute kidney injury) (HCC)   History of metabolic acidosis with increased anion gap   Leukocytosis   Abnormal urinalysis   Hyperkalemia   Prolonged QT interval   Discharged Condition: stable  Hospital Course: Patient is a 46-year-old African-American female with past medical history significant for diabetes mellitus on insulin, Graves' disease and obesity.  Patient was admitted in DKA.  Apparently, patient had not been compliant with her insulin regimen.  Patient has been on management for DKA.  Anion gap is closed.  Patient has been started on transition protocol  Severe DKA, type II Patient presents with glucose of 511 with CO2 less than 7 and anion gap unable to be calculated.  She recently ran out of insulin.  Last available hemoglobin A1c 10.4 on 04/04/2021.  Home medication regimen includes metformin XR 1000 mg twice daily, and glargine 15 units daily, NovoLog 4 units 3 times daily with meals and Trulicity.  Venous pH was able to be obtained was noted to be 6.876. -Admit to a progressive bed -Hyperglycemia order set utilized -Hold metformin -Continue insulin drip per protocol -Switch IV fluids to sodium bicarb at 125ml/h -Serial BMP and beta hydroxybutyrate -Transition to subcu insulin once medically appropriate  06/28/2021: And gap is closed.  We will start transition protocol.  Abnormal electrolytes have been monitored and corrected.   Metabolic acidosis with increased anion gap Acute.  On admission CO2 was less than 7 with anion gap unable to be calculated. -Sodium bicarb drip as noted above  -Continue to monitor 06/28/2021: Anion gap is closed.   Leukocytosis Acute.  WBC elevated at 14.6.   Chest x-ray did not note any acute abnormality.  Urinalysis was noted to be abnormal. -Check urine culture -Recheck CBC tomorrow morning 06/28/2021: No new WBC today.  We will check CBC in the morning.   Abnormal urinalysis/Possible UTI Acute.  Patient reported having urinary frequency we will which was thought secondary to elevated blood sugars as well as some acute discomfort.  Urinalysis noted greater than 500 glucose, large hemoglobin, 80 ketones, rare bacteria, and greater than 50 WBCs and 11-20 WBCs. -Check urine culture -Empiric antibiotics of Rocephin IV 06/28/2021: Follow cultures.  Continue antibiotics for now.   Hyperkalemia/hypokalemia: Acute.  Potassium 5.3.  EKG noted T wave abnormalities but appear to be more so chronic in nature.  Patient has been given IV fluids and started on insulin drip. -Continue to monitor 06/28/2021: Patient has remained largely hypokalemic.  Continue to monitor and replete.  Follow magnesium level.   Acute kidney injury On admission creatinine elevated up to 1.5 with BUN 11.  Baseline creatinine previously noted to be 0.74.  Patient still able to urinate at this time. -IV fluids as noted above -Continue to monitor kidney function 06/28/2021: Slowly improving.   Prolonged QT interval Acute.  On admission QTc was 560. -Avoid QT prolonging medications 06/28/2021: Recheck EKG after electrolytes have been corrected.   Hyponatremia Acute.  Sodium 131 on admission.  When adjusted for patient's hyperglycemia sodium levels appear to be within normal limits. 06/28/2021: Volume related.  Slowly resolving.  Last sodium was 134.   Anxiety Home medication regimen includes sertraline. -Hold sertraline due to prolonged QT   Tobacco abuse Patient reports smoking half pack of   cigarettes per day on average currently. -Nicotine patch offered -Continue to counsel on need for cessation of tobacco use   Alcohol use Patient reports drinking wine, but not on a daily  basis.  Denies any history of withdrawals.      Consults: None  Significant Diagnostic Studies: {diagnostics:18242}  Treatments: {Tx:18249}  Discharge Exam: Blood pressure 106/75, pulse 88, temperature 98.4 F (36.9 C), temperature source Oral, resp. rate 16, weight 68 kg, SpO2 100 %.   Disposition: Discharge disposition: 01-Home or Self Care       Discharge Instructions     Diet Carb Modified   Complete by: As directed    Increase activity slowly   Complete by: As directed       Allergies as of 06/29/2021   No Known Allergies      Medication List     STOP taking these medications    diphenhydrAMINE 25 MG tablet Commonly known as: BENADRYL   ibuprofen 200 MG tablet Commonly known as: ADVIL   Vitamin D3 50 MCG (2000 UT) Tabs       TAKE these medications    Basaglar KwikPen 100 UNIT/ML Inject 20 Units into the skin daily. What changed:  how much to take when to take this   blood glucose meter kit and supplies Dispense based on patient and insurance preference. Use up to four times daily as directed. (FOR ICD-10 E10.9, E11.9).   FreeStyle Libre 14 Day Reader Devi 1 each by Does not apply route daily.   FreeStyle Libre 2 Sensor Misc 1 Device by Does not apply route every 14 (fourteen) days.   Insulin Pen Needle 32G X 4 MM Misc Use daily for glucose control   Insulin Syringe-Needle U-100 27G X 1/2" 1 ML Misc Use daily for glucose control.  DxE11.9   metFORMIN 500 MG 24 hr tablet Commonly known as: GLUCOPHAGE-XR Take 2 tablets (1,000 mg total) by mouth daily.   norethindrone 0.35 MG tablet Commonly known as: MICRONOR Take 1 tablet (0.35 mg total) by mouth daily.   NovoLOG FlexPen 100 UNIT/ML FlexPen Generic drug: insulin aspart SMARTSIG:4 Unit(s) SUB-Q 3 Times Daily   sertraline 100 MG tablet Commonly known as: ZOLOFT Take 1 tablet (100 mg total) by mouth daily. APPT NEEDED FOR FUTURE REFILLS   Trulicity 3 MG/0.5ML Sopn Generic drug:  Dulaglutide Inject into the skin.         Signed: Sylvester I Ogbata 06/29/2021, 2:07 PM   

## 2021-07-02 ENCOUNTER — Telehealth: Payer: Self-pay

## 2021-07-02 ENCOUNTER — Telehealth: Payer: Self-pay | Admitting: Internal Medicine

## 2021-07-02 NOTE — Telephone Encounter (Signed)
Pt will fu with Dr Dwyane Dee 07-03-21 for hosp fu

## 2021-07-02 NOTE — Telephone Encounter (Addendum)
Pt is calling checking on the disability form from [employer]. I told pt to have them refax

## 2021-07-02 NOTE — Telephone Encounter (Signed)
Pt called back and she is aware md does have form

## 2021-07-03 ENCOUNTER — Encounter: Payer: Self-pay | Admitting: Endocrinology

## 2021-07-03 ENCOUNTER — Ambulatory Visit (INDEPENDENT_AMBULATORY_CARE_PROVIDER_SITE_OTHER): Payer: 59 | Admitting: Endocrinology

## 2021-07-03 VITALS — BP 112/72 | HR 86 | Wt 150.0 lb

## 2021-07-03 DIAGNOSIS — E785 Hyperlipidemia, unspecified: Secondary | ICD-10-CM | POA: Diagnosis not present

## 2021-07-03 DIAGNOSIS — E1065 Type 1 diabetes mellitus with hyperglycemia: Secondary | ICD-10-CM

## 2021-07-03 DIAGNOSIS — E1069 Type 1 diabetes mellitus with other specified complication: Secondary | ICD-10-CM | POA: Diagnosis not present

## 2021-07-03 LAB — POCT GLYCOSYLATED HEMOGLOBIN (HGB A1C): Hemoglobin A1C: 12.1 % — AB (ref 4.0–5.6)

## 2021-07-03 LAB — POCT GLUCOSE (DEVICE FOR HOME USE): Glucose Fasting, POC: 223 mg/dL — AB (ref 70–99)

## 2021-07-03 MED ORDER — DEXCOM G7 SENSOR MISC: 1.0000 | 3 refills | Status: DC

## 2021-07-03 MED ORDER — TOUJEO SOLOSTAR 300 UNIT/ML ~~LOC~~ SOPN: 20.0000 [IU] | PEN_INJECTOR | Freq: Every day | SUBCUTANEOUS | 1 refills | Status: DC

## 2021-07-03 NOTE — Progress Notes (Signed)
Patient ID: Samantha Willis, female   DOB: 08-Apr-1975, 46 y.o.   MRN: [id number]    Chief complaint : Follow up of Type 1 Diabetes  History of Present Illness:          Date of diagnosis: 2021  Prior history:   At the time of diagnosis she weighed about 165-170 lb Initially her symptoms were weight loss, blood sugar of over 600 and was started on insulin By history her A1c has ranged from 14.7 at diagnosis and lowest reading of 7.3 as of 8/22 and subsequently higher  INSULIN regimen : NovoLog 15 units in the morning Non-insulin hypoglycemic drugs: Metformin 1 g, Trulicity 3 mg weekly  Recent history:     Her A1c has gone up further, A1c currently 12.1 compared to 10.4 on the last visit  Insulin usage: She says she got headaches from [place] and this was persistent all day and she stopped this about a year ago, was not given Any alternative insulin for basal Although she was supposed to take insulin with meals she says she is only taking 15 units in the morning on waking up  Glucose patterns: Fasting glucose: Usually over 200 Nonfasting readings may be down to 230 midmorning but usually are 200 or more the rest of the day She could not afford her refill and with being out of insulin for the week or so she was admitted with ketoacidosis on 06/27/2021 Again even though she is supposed to take NovoLog 5 units before each meal she is only taking the 15 units in the morning and no basal insulin Appears to have lost 9 pounds since March She was told to start the freestyle libre sensor but she cannot afford the co-pay Also was recommended insulin pump at some point but she did not Except for education at the time of her hospitalization for diabetes in 2021 she has not been followed by the diabetes educator     Glucose monitoring:  done ?  2 times a day         Glucometer: [place]      Blood Glucose reading summary from recall :   PREMEAL Breakfast Lunch Dinner Bedtime  Overall  Glucose range: 200+   200   Median:        POST-MEAL PC Breakfast PC Lunch PC Dinner  Glucose range: 130    Median:      Hypoglycemia: Minimal  Self-care: The diet that the patient has been following is: None         Wt Readings from Last 3 Encounters:  07/03/21 150 lb (68 kg)  06/27/21 150 lb (68 kg)  06/06/21 146 lb (66.2 kg)     Diabetes labs:  Lab Results  Component Value Date   HGBA1C 12.1 (A) 07/03/2021   HGBA1C 10.4 (A) 04/04/2021   HGBA1C 8.6 (A) 02/05/2021   Lab Results  Component Value Date   LDLCALC 125 (H) 03/15/2020   CREATININE 0.83 06/29/2021    Office Visit on 07/03/2021  Component Date Value Ref Range Status   Hemoglobin A1C 07/03/2021 12.1 (A)  4.0 - 5.6 % Final   Glucose Fasting, POC 07/03/2021 223 (A)  70 - 99 mg/dL Final  No results displayed because visit has over 200 results.      Allergies as of 07/03/2021   No Known Allergies      Medication List        Accurate as of July 03, 2021  4:44 PM. If you  have any questions, ask your nurse or doctor.          STOP taking these medications    Basaglar KwikPen 100 UNIT/ML Replaced by: Toujeo SoloStar 300 UNIT/ML Solostar Pen Stopped by: Elayne Snare, MD       TAKE these medications    blood glucose meter kit and supplies Dispense based on patient and insurance preference. Use up to four times daily as directed. (FOR ICD-10 E10.9, E11.9).   Dexcom G7 Sensor Misc 1 Device by Does not apply route as directed. Change sensor every 10 days What changed:  when to take this additional instructions Changed by: Elayne Snare, MD   FreeStyle Libre 14 Day Reader Kerrin Mo 1 each by Does not apply route daily.   Insulin Pen Needle 32G X 4 MM Misc Use daily for glucose control   Insulin Syringe-Needle U-100 27G X 1/2" 1 ML Misc Use daily for glucose control.  DxE11.9   metFORMIN 500 MG 24 hr tablet Commonly known as: GLUCOPHAGE-XR Take 2 tablets (1,000 mg total) by mouth daily.    norethindrone 0.35 MG tablet Commonly known as: MICRONOR Take 1 tablet (0.35 mg total) by mouth daily.   NovoLOG FlexPen 100 UNIT/ML FlexPen Generic drug: insulin aspart SMARTSIG:4 Unit(s) SUB-Q 3 Times Daily   sertraline 100 MG tablet Commonly known as: ZOLOFT Take 1 tablet (100 mg total) by mouth daily. APPT NEEDED FOR FUTURE REFILLS   Toujeo SoloStar 300 UNIT/ML Solostar Pen Generic drug: insulin glargine (1 Unit Dial) Inject 20 Units into the skin daily. Replaces: Basaglar KwikPen 100 UNIT/ML Started by: Elayne Snare, MD   Trulicity 3 JZ/7.9XT Sopn Generic drug: Dulaglutide Inject into the skin.        Allergies: No Known Allergies  Past Medical History:  Diagnosis Date   Anxiety    Carpal tunnel syndrome 05/06/2014    Bilateral   Diabetic ketoacidosis ([place]) 11/17/2019   GERD (gastroesophageal reflux disease)    Graves disease    Graves Disease   Heart palpitations    Obesity     Past Surgical History:  Procedure Laterality Date   Biopsy of Right Breast     At [place] Right 11/21/2015   Procedure: CARPAL TUNNEL RELEASE right;  Surgeon: Daryll Brod, MD;  Location: [place] [facility];  Service: Orthopedics;  Laterality: Right;  FAB   CARPAL TUNNEL RELEASE Left 06/11/2016   Procedure: LEFT CARPAL TUNNEL RELEASE;  Surgeon: Daryll Brod, MD;  Location: [facility];  Service: Orthopedics;  Laterality: Left;  REG/FAB   CESAREAN SECTION     IUD REMOVAL     TUBAL LIGATION  2011   WISDOM TOOTH EXTRACTION      Family History  Problem Relation Age of Onset   Heart attack Mother    Cancer Mother 59       breast   Diabetes Mother        II   Hypertension Father    Hyperlipidemia Father    Gout Father    Healthy Sister    Healthy Brother    Healthy Brother     Social History:  reports that she quit smoking about 19 months ago. Her smoking use included cigarettes. She smoked an average of .25 packs per day.  She has never used smokeless tobacco. She reports that she does not currently use alcohol. She reports that she does not use drugs.    Review of Systems:   Blood pressure:  Consistently normal  BP Readings from Last 3 Encounters:  07/03/21 112/72  06/29/21 106/75  06/06/21 104/68     Lipids: Has had high lipids previously but not on statins  Lab Results  Component Value Date   CHOL 236 (H) 03/15/2020   HDL 85.30 03/15/2020   LDLCALC 125 (H) 03/15/2020   TRIG 133.0 03/15/2020   CHOLHDL 3 03/15/2020     Diabetes complications: None known  She had a small furuncle on her buttock area treated in the hospital with antibiotics and now this has opened, currently not taking any medication  She is on birth control pill from her gynecologist   Physical Examination:  BP 112/72   Pulse 86   Wt 150 lb (68 kg)   SpO2 99%   BMI 25.95 kg/m         ASSESSMENT/PLAN   Diabetes type 1 with persistently poor control   For about a year reportedly she has not had any basal insulin and only once a day NovoLog insulin Blood sugars were better likely when she was on basal insulin last year with A1c going down to 7.3 but may have been partly related to honeymoon phase   Problems identified: Not on any basal insulin Very poor understanding of diabetes management and insulin dynamics in the body No meal planning available recently Likely not benefiting from taking Trulicity and metformin as she is a type I insulin deficient Unclear how often she is monitoring her sugars and no fingerstick records available She previously was not able to afford freestyle libre  Insulin regimen as follows   Novolog 6 at breakfast and 8 at dinner, 3 Units at lunch  Check 2-3 hrs after meals, if out of target 130-180 range go up or down 1 to 2 units for that meal  Toujeo 20 units in am Titration sheet given to adjust the dose by 2 units supper down every 3 days to get blood sugars below 130  consistently  We will try to get her monitoring started with Dexcom 7 sensor if affordable  She will need to follow-up with the diabetes educator for extensive diabetes education Long-term also may benefit from switching to an insulin pump, cost may be a factor  Stop trulicity     MICROALBUMIN to be checked when blood sugars are better controlled Also need follow-up lipids and likely a statin drug for her hypercholesterolemia                     Patient Instructions  Novolog 6 at breakfast and 8 at dinner, 3 Units at lunch  Check 2-3 hrs after meals,  target 130-180  Toujeo 20 in am  Stop trulicity    Elayne Snare 07/03/2021, 4:44 PM

## 2021-07-03 NOTE — Patient Instructions (Addendum)
Novolog 6 at breakfast and 8 at dinner, 3 Units at lunch  Check 2-3 hrs after meals,  target 130-180  Toujeo 20 in am  Stop trulicity

## 2021-07-04 NOTE — Telephone Encounter (Signed)
Patient has scheduled an appointment for 07/11/21.

## 2021-07-11 ENCOUNTER — Ambulatory Visit (INDEPENDENT_AMBULATORY_CARE_PROVIDER_SITE_OTHER): Payer: 59 | Admitting: Internal Medicine

## 2021-07-11 ENCOUNTER — Encounter: Payer: Self-pay | Admitting: Internal Medicine

## 2021-07-11 VITALS — BP 106/58 | HR 93 | Temp 99.6°F | Wt 161.6 lb

## 2021-07-11 DIAGNOSIS — B3731 Acute candidiasis of vulva and vagina: Secondary | ICD-10-CM

## 2021-07-11 DIAGNOSIS — R300 Dysuria: Secondary | ICD-10-CM

## 2021-07-11 DIAGNOSIS — E1165 Type 2 diabetes mellitus with hyperglycemia: Secondary | ICD-10-CM

## 2021-07-11 DIAGNOSIS — Z794 Long term (current) use of insulin: Secondary | ICD-10-CM | POA: Diagnosis not present

## 2021-07-11 DIAGNOSIS — E785 Hyperlipidemia, unspecified: Secondary | ICD-10-CM

## 2021-07-11 DIAGNOSIS — E1169 Type 2 diabetes mellitus with other specified complication: Secondary | ICD-10-CM | POA: Diagnosis not present

## 2021-07-11 DIAGNOSIS — Z09 Encounter for follow-up examination after completed treatment for conditions other than malignant neoplasm: Secondary | ICD-10-CM

## 2021-07-11 LAB — MICROALBUMIN / CREATININE URINE RATIO
Creatinine,U: 148.3 mg/dL
Microalb Creat Ratio: 0.5 mg/g (ref 0.0–30.0)
Microalb, Ur: <0.7 mg/dL (ref 0.0–1.9)

## 2021-07-11 LAB — LIPID PANEL
Cholesterol: 196 mg/dL (ref 0–200)
HDL: 80.2 mg/dL (ref >39.00)
LDL Cholesterol: 105 mg/dL — ABNORMAL HIGH (ref 0–99)
NonHDL: 116.04
Total CHOL/HDL Ratio: 2
Triglycerides: 56 mg/dL (ref 0.0–149.0)
VLDL: 11.2 mg/dL (ref 0.0–40.0)

## 2021-07-11 LAB — POCT URINALYSIS DIPSTICK
Bilirubin, UA: NEGATIVE
Blood, UA: NEGATIVE
Glucose, UA: NEGATIVE
Ketones, UA: NEGATIVE
Leukocytes, UA: NEGATIVE
Nitrite, UA: NEGATIVE
Protein, UA: POSITIVE — AB
Spec Grav, UA: 1.02 (ref 1.010–1.025)
Urobilinogen, UA: NEGATIVE E.U./dL — AB
pH, UA: 6 (ref 5.0–8.0)

## 2021-07-11 MED ORDER — FLUCONAZOLE 150 MG PO TABS: 150.0000 mg | ORAL_TABLET | Freq: Once | ORAL | 0 refills | Status: AC

## 2021-07-11 MED ORDER — ATORVASTATIN CALCIUM 80 MG PO TABS: 80.0000 mg | ORAL_TABLET | Freq: Every day | ORAL | 1 refills | Status: DC

## 2021-07-11 NOTE — Patient Instructions (Signed)
-  Nice seeing you today!!  -Lab work today; will notify you once results are available.  -Start lipitor 80 mg at bedtime.  -Schedule follow up in 3 months.

## 2021-07-11 NOTE — Progress Notes (Signed)
Established Patient Office Visit     CC/Reason for Visit: Follow-up chronic conditions, hospital follow-up, fill out forms  HPI: Samantha Willis is a 45 y.o. female who is coming in today for the above mentioned reasons. Past Medical History is significant for: Insulin-dependent diabetes that is not well controlled who was recently hospitalized May/31 through June/2 for DKA.  She had been nonadherent to her insulin therapy.  She has already followed up with endocrinology.  They have placed her on a basal/bolus regimen of insulin and taken her off all other medications.  She has not yet on a statin, she is due for microalbumin.  She is complaining of dysuria.  She is requesting forms to be filled out for leave from work during her hospital stay.   Past Medical/Surgical History: Past Medical History:  Diagnosis Date   Anxiety    Carpal tunnel syndrome 05/06/2014    Bilateral   Diabetic ketoacidosis ([place]) 11/17/2019   GERD (gastroesophageal reflux disease)    Graves disease    Graves Disease   Heart palpitations    Obesity     Past Surgical History:  Procedure Laterality Date   Biopsy of Right Breast     At [place] Right 11/21/2015   Procedure: CARPAL TUNNEL RELEASE right;  Surgeon: Daryll Brod, MD;  Location: [facility];  Service: Orthopedics;  Laterality: Right;  FAB   CARPAL TUNNEL RELEASE Left 06/11/2016   Procedure: LEFT CARPAL TUNNEL RELEASE;  Surgeon: Daryll Brod, MD;  Location: [facility];  Service: Orthopedics;  Laterality: Left;  REG/FAB   CESAREAN SECTION     IUD REMOVAL     TUBAL LIGATION  2011   WISDOM TOOTH EXTRACTION      Social History:  reports that she quit smoking about 20 months ago. Her smoking use included cigarettes. She smoked an average of .25 packs per day. She has never used smokeless tobacco. She reports that she does not currently use alcohol. She reports that she does not  use drugs.  Allergies: No Known Allergies  Family History:  Family History  Problem Relation Age of Onset   Heart attack Mother    Cancer Mother 46       breast   Diabetes Mother        II   Hypertension Father    Hyperlipidemia Father    Gout Father    Healthy Sister    Healthy Brother    Healthy Brother      Current Outpatient Medications:    atorvastatin (LIPITOR) 80 MG tablet, Take 1 tablet (80 mg total) by mouth daily., Disp: 90 tablet, Rfl: 1   blood glucose meter kit and supplies, Dispense based on patient and insurance preference. Use up to four times daily as directed. (FOR ICD-10 E10.9, E11.9)., Disp: 1 each, Rfl: 0   Continuous Blood Gluc Receiver (FREESTYLE LIBRE 14 DAY READER) DEVI, 1 each by Does not apply route daily., Disp: 1 each, Rfl: 2   Continuous Blood Gluc Sensor (DEXCOM G7 SENSOR) MISC, 1 Device by Does not apply route as directed. Change sensor every 10 days, Disp: 3 each, Rfl: 3   insulin glargine, 1 Unit Dial, (TOUJEO SOLOSTAR) 300 UNIT/ML Solostar Pen, Inject 20 Units into the skin daily., Disp: 15 mL, Rfl: 1   Insulin Pen Needle 32G X 4 MM MISC, Use daily for glucose control, Disp: 100 each, Rfl: 3   Insulin Syringe-Needle  U-100 27G X 1/2" 1 ML MISC, Use daily for glucose control.  DxE11.9, Disp: 100 each, Rfl: 3   norethindrone (MICRONOR) 0.35 MG tablet, Take 1 tablet (0.35 mg total) by mouth daily., Disp: 84 tablet, Rfl: 4   NOVOLOG FLEXPEN 100 UNIT/ML FlexPen, SMARTSIG:4 Unit(s) SUB-Q 3 Times Daily, Disp: 15 mL, Rfl: 11   sertraline (ZOLOFT) 100 MG tablet, Take 1 tablet (100 mg total) by mouth daily. APPT NEEDED FOR FUTURE REFILLS, Disp: 30 tablet, Rfl: 0   TRULICITY 3 MG/0.5ML SOPN, Inject into the skin., Disp: , Rfl:    metFORMIN (GLUCOPHAGE-XR) 500 MG 24 hr tablet, Take 2 tablets (1,000 mg total) by mouth daily. (Patient not taking: Reported on 07/11/2021), Disp: 180 tablet, Rfl: 3  Review of Systems:  Constitutional: Denies fever, chills,  diaphoresis, appetite change and fatigue.  HEENT: Denies photophobia, eye pain, redness, hearing loss, ear pain, congestion, sore throat, rhinorrhea, sneezing, mouth sores, trouble swallowing, neck pain, neck stiffness and tinnitus.   Respiratory: Denies SOB, DOE, cough, chest tightness,  and wheezing.   Cardiovascular: Denies chest pain, palpitations and leg swelling.  Gastrointestinal: Denies nausea, vomiting, abdominal pain, diarrhea, constipation, blood in stool and abdominal distention.  Genitourinary: Denies dysuria, urgency, frequency, hematuria, flank pain and difficulty urinating.  Endocrine: Denies: hot or cold intolerance, sweats, changes in hair or nails, polyuria, polydipsia. Musculoskeletal: Denies myalgias, back pain, joint swelling, arthralgias and gait problem.  Skin: Denies pallor, rash and wound.  Neurological: Denies dizziness, seizures, syncope, weakness, light-headedness, numbness and headaches.  Hematological: Denies adenopathy. Easy bruising, personal or family bleeding history  Psychiatric/Behavioral: Denies suicidal ideation, mood changes, confusion, nervousness, sleep disturbance and agitation    Physical Exam: Vitals:   07/11/21 1409  BP: (!) 106/58  Pulse: 93  Temp: 99.6 F (37.6 C)  TempSrc: Oral  SpO2: 94%  Weight: 161 lb 9.6 oz (73.3 kg)    Body mass index is 27.96 kg/m.   Constitutional: NAD, calm, comfortable Eyes: PERRL, lids and conjunctivae normal ENMT: Mucous membranes are moist.  Respiratory: clear to auscultation bilaterally, no wheezing, no crackles. Normal respiratory effort. No accessory muscle use.  Cardiovascular: Regular rate and rhythm, no murmurs / rubs / gallops. No extremity edema. Neurologic: Grossly intact and nonfocal Psychiatric: Normal judgment and insight. Alert and oriented x 3. Normal mood.    Impression and Plan:  Dysuria -In office urine dipstick is negative.  Hospital discharge follow-up -Hospital charts  reviewed in detail, she was admitted for DKA which resolved, she has already followed with endocrinology and is on a basal/bolus regimen.  Type 2 diabetes mellitus with hyperglycemia, with long-term current use of insulin (HCC)  - Plan: Microalbumin / creatinine urine ratio -Recent A1c in the hospital was 12.1. -She follows with endocrinology.  Hyperlipidemia associated with type 2 diabetes mellitus (HCC)  - Plan: atorvastatin (LIPITOR) 80 MG tablet, Lipid panel    Time spent:33 minutes reviewing chart, interviewing and examining patient and formulating plan of care.   Patient Instructions  -Nice seeing you today!!  -Lab work today; will notify you once results are available.  -Start lipitor 80 mg at bedtime.  -Schedule follow up in 3 months.    Estela Hernandez Acosta, MD [facility]   

## 2021-07-12 ENCOUNTER — Telehealth: Payer: Self-pay | Admitting: Internal Medicine

## 2021-07-12 NOTE — Telephone Encounter (Signed)
Pt called to verify prescription  fluconazole (DIFLUCAN) 150 MG tablet   said pharmacy was trying to give her a cream.

## 2021-07-25 ENCOUNTER — Encounter: Payer: Self-pay | Admitting: Internal Medicine

## 2021-07-27 ENCOUNTER — Telehealth: Payer: Self-pay | Admitting: Internal Medicine

## 2021-07-27 DIAGNOSIS — E1169 Type 2 diabetes mellitus with other specified complication: Secondary | ICD-10-CM

## 2021-07-27 NOTE — Telephone Encounter (Signed)
Pt called asking to speak to Samantha Willis in regards to a letter she received. Pt would like Samantha Willis to call her back on Monday.

## 2021-07-30 NOTE — Telephone Encounter (Signed)
Spoke with patient and reviewed lab results.  Patient recently started her Lipitor. Lab appointment and order placed.

## 2021-08-03 NOTE — Discharge Summary (Signed)
Physician Discharge Summary  Patient ID: Samantha Willis MRN: [id number] DOB/AGE: [birth year] 46 y.o.  Admit date: 06/27/2021 Discharge date: 6/2//2023  Admission Diagnoses:  Discharge Diagnoses:  Principal Problem:   DKA (diabetic ketoacidosis) ([place]) Active Problems:   AKI (acute kidney injury) ([place])   History of metabolic acidosis with increased anion gap   Leukocytosis   Abnormal urinalysis   Hyperkalemia   Prolonged QT interval   Discharged Condition: stable  Hospital Course: Patient is a 46 year old African-American female with past medical history significant for diabetes mellitus on insulin, Graves' disease and obesity.  Patient was admitted in DKA.  Apparently, patient had not been compliant with her insulin regimen.  Patient was admitted and managed for DKA.  Anion gap has closed.  Patient has been optimized and will be discharged back.    Severe DKA, type II Patient presents with glucose of 511 with CO2 less than 7 and anion gap unable to be calculated.  She recently ran out of insulin.  Last available hemoglobin A1c 10.4 on 04/04/2021.  Home medication regimen includes metformin XR 1000 mg twice daily, and glargine 15 units daily, NovoLog 4 units 3 times daily with meals and Trulicity.  Venous pH was able to be obtained was noted to be 6.876. -Admit to a progressive bed -Hyperglycemia order set utilized -Hold metformin -Continue insulin drip per protocol -Switch IV fluids to sodium bicarb at 181m/h -Serial BMP and beta hydroxybutyrate -Transition to subcu insulin once medically appropriate  06/28/2021: And gap is closed.  We will start transition protocol.  Abnormal electrolytes have been monitored and corrected.   Metabolic acidosis with increased anion gap Acute.  On admission CO2 was less than 7 with anion gap unable to be calculated. -Sodium bicarb drip as noted above  -Continue to monitor 06/28/2021: Anion gap is closed.   Leukocytosis Acute.  WBC  elevated at 14.6.  Chest x-ray did not note any acute abnormality.  Urinalysis was noted to be abnormal. -Check urine culture -Recheck CBC tomorrow morning 06/28/2021: No new WBC today.  We will check CBC in the morning.   Abnormal urinalysis/Possible UTI Acute.  Patient reported having urinary frequency we will which was thought secondary to elevated blood sugars as well as some acute discomfort.  Urinalysis noted greater than 500 glucose, large hemoglobin, 80 ketones, rare bacteria, and greater than 50 WBCs and 11-20 WBCs. -Check urine culture -Empiric antibiotics of Rocephin IV 06/28/2021: Follow cultures.  Continue antibiotics for now.   Hyperkalemia/hypokalemia: Acute.  Potassium 5.3.  EKG noted T wave abnormalities but appear to be more so chronic in nature.  Patient has been given IV fluids and started on insulin drip. -Continue to monitor 06/28/2021: Patient has remained largely hypokalemic.  Continue to monitor and replete.  Follow magnesium level.   Acute kidney injury On admission creatinine elevated up to 1.5 with BUN 11.  Baseline creatinine previously noted to be 0.74.  Patient still able to urinate at this time. -IV fluids as noted above -Continue to monitor kidney function 06/28/2021: Slowly improving.   Prolonged QT interval Acute.  On admission QTc was 560. -Avoid QT prolonging medications 06/28/2021: Recheck EKG after electrolytes have been corrected.   Hyponatremia Acute.  Sodium 131 on admission.  When adjusted for patient's hyperglycemia sodium levels appear to be within normal limits. 06/28/2021: Volume related.  Slowly resolving.  Last sodium was 134.   Anxiety Home medication regimen includes sertraline. -Hold sertraline due to prolonged QT   Tobacco abuse Patient reports  smoking half pack of cigarettes per day on average currently. -Nicotine patch offered -Continue to counsel on need for cessation of tobacco use   Alcohol use Patient reports drinking wine, but  not on a daily basis.  Denies any history of withdrawals.    Consults: None   Discharge Exam: Blood pressure 106/75, pulse 88, temperature 98.4 F (36.9 C), temperature source Oral, resp. rate 16, weight 68 kg, last menstrual period 05/27/2021, SpO2 100 %.   Disposition: Discharge disposition: 01-Home or Self Care       Discharge Instructions     Diet Carb Modified   Complete by: As directed    Increase activity slowly   Complete by: As directed       Allergies as of 06/29/2021   No Known Allergies      Medication List     STOP taking these medications    Basaglar KwikPen 100 UNIT/ML   diphenhydrAMINE 25 MG tablet Commonly known as: BENADRYL   ibuprofen 200 MG tablet Commonly known as: ADVIL   Vitamin D3 50 MCG (2000 UT) Tabs       TAKE these medications    blood glucose meter kit and supplies Dispense based on patient and insurance preference. Use up to four times daily as directed. (FOR ICD-10 E10.9, E11.9).   FreeStyle Libre 14 Day Reader Kerrin Mo 1 each by Does not apply route daily.   Insulin Pen Needle 32G X 4 MM Misc Use daily for glucose control   Insulin Syringe-Needle U-100 27G X 1/2" 1 ML Misc Use daily for glucose control.  DxE11.9   metFORMIN 500 MG 24 hr tablet Commonly known as: GLUCOPHAGE-XR Take 2 tablets (1,000 mg total) by mouth daily. Notes to patient: Treat diabetes    norethindrone 0.35 MG tablet Commonly known as: MICRONOR Take 1 tablet (0.35 mg total) by mouth daily. Notes to patient: Birth control    NovoLOG FlexPen 100 UNIT/ML FlexPen Generic drug: insulin aspart SMARTSIG:4 Unit(s) SUB-Q 3 Times Daily Notes to patient: Treat diabetes   sertraline 100 MG tablet Commonly known as: ZOLOFT Take 1 tablet (100 mg total) by mouth daily. APPT NEEDED FOR FUTURE REFILLS Notes to patient: Depression/Anxiety   Trulicity 3 BT/5.1VO Sopn Generic drug: Dulaglutide Inject into the skin. Notes to patient: Treat diabetes           Signed: Bonnell Public 08/03/2021, 8:38 PM

## 2021-08-31 ENCOUNTER — Telehealth: Payer: Self-pay | Admitting: Internal Medicine

## 2021-08-31 NOTE — Telephone Encounter (Signed)
Pt is calling and needs FMLA paperwork she gave dr Jerilee Hoh back in June for intermittent FMLA for 2 days a month to be completed. Pt did not give the reason for FMLA

## 2021-09-03 ENCOUNTER — Other Ambulatory Visit: Payer: 59

## 2021-09-03 NOTE — Telephone Encounter (Signed)
Form placed on Dr Hernandez's desk 

## 2021-09-04 NOTE — Telephone Encounter (Signed)
Form is ready to be picked up.  Form can be faxed after patient has completed the form.  Patient is aware.

## 2021-09-05 ENCOUNTER — Encounter: Payer: 59 | Admitting: Endocrinology

## 2021-09-05 NOTE — Progress Notes (Signed)
This encounter was created in error - please disregard.

## 2021-09-24 ENCOUNTER — Ambulatory Visit: Payer: 59 | Admitting: Dietician

## 2021-10-30 ENCOUNTER — Other Ambulatory Visit: Payer: 59

## 2021-12-23 ENCOUNTER — Other Ambulatory Visit: Payer: Self-pay | Admitting: Internal Medicine

## 2021-12-23 DIAGNOSIS — E1165 Type 2 diabetes mellitus with hyperglycemia: Secondary | ICD-10-CM

## 2021-12-27 ENCOUNTER — Telehealth: Payer: Self-pay | Admitting: *Deleted

## 2021-12-27 NOTE — Telephone Encounter (Signed)
Patient has scheduled an office visit

## 2021-12-27 NOTE — Telephone Encounter (Signed)
Pt called, returning CMA's call. CMA was unavailable. Pt asked that CMA call back at their earliest convenience. 

## 2021-12-27 NOTE — Telephone Encounter (Signed)
Left detailed message on machine for patient to return our call. We have received FLMA forms Why does she need FLMA? What days or time is she requesting?

## 2022-01-02 ENCOUNTER — Ambulatory Visit (INDEPENDENT_AMBULATORY_CARE_PROVIDER_SITE_OTHER): Payer: 59 | Admitting: Internal Medicine

## 2022-01-02 ENCOUNTER — Encounter: Payer: Self-pay | Admitting: Internal Medicine

## 2022-01-02 VITALS — BP 120/80 | HR 105 | Temp 98.1°F | Wt 168.6 lb

## 2022-01-02 DIAGNOSIS — Z794 Long term (current) use of insulin: Secondary | ICD-10-CM

## 2022-01-02 DIAGNOSIS — E1169 Type 2 diabetes mellitus with other specified complication: Secondary | ICD-10-CM

## 2022-01-02 DIAGNOSIS — E1165 Type 2 diabetes mellitus with hyperglycemia: Secondary | ICD-10-CM

## 2022-01-02 DIAGNOSIS — E785 Hyperlipidemia, unspecified: Secondary | ICD-10-CM

## 2022-01-02 LAB — POCT GLYCOSYLATED HEMOGLOBIN (HGB A1C): Hemoglobin A1C: 9.4 % — AB (ref 4.0–5.6)

## 2022-01-02 NOTE — Progress Notes (Signed)
Established Patient Office Visit     CC/Reason for Visit: FMLA forms  HPI: Samantha Willis is a 46 y.o. female who is coming in today for the above mentioned reasons. Past Medical History is significant for: Uncontrolled insulin-dependent diabetes, hyperlipidemia, generalized anxiety disorder.  I have not seen her since June 2023.  She has not seen her endocrinologist since that time as well.  Last year I signed for her to have intermittent FMLA for 2 days a month for diabetic care.  It appears that since September she has had about 8  absences that go above her allotted FMLA and her employer is seeking explanation.  No records in our chart system explain these absences.  She states that she has had several instances of cutaneous skin abscesses that required lancing and treatment at urgent care as well as a yeast infection and other GYN issues that required GYN appointments.  She states she canceled her last follow-up with her endocrinologist because she had no more FMLA days.   Past Medical/Surgical History: Past Medical History:  Diagnosis Date   Anxiety    Carpal tunnel syndrome 05/06/2014    Bilateral   Diabetic ketoacidosis ([place]) 11/17/2019   GERD (gastroesophageal reflux disease)    Graves disease    Graves Disease   Heart palpitations    Obesity     Past Surgical History:  Procedure Laterality Date   Biopsy of Right Breast     At [place] Right 11/21/2015   Procedure: CARPAL TUNNEL RELEASE right;  Surgeon: Daryll Brod, MD;  Location: [facility];  Service: Orthopedics;  Laterality: Right;  FAB   CARPAL TUNNEL RELEASE Left 06/11/2016   Procedure: LEFT CARPAL TUNNEL RELEASE;  Surgeon: Daryll Brod, MD;  Location: [facility];  Service: Orthopedics;  Laterality: Left;  REG/FAB   CESAREAN SECTION     IUD REMOVAL     TUBAL LIGATION  2011   WISDOM TOOTH EXTRACTION      Social History:  reports that  she quit smoking about 2 years ago. Her smoking use included cigarettes. She smoked an average of .25 packs per day. She has never used smokeless tobacco. She reports that she does not currently use alcohol. She reports that she does not use drugs.  Allergies: No Known Allergies  Family History:  Family History  Problem Relation Age of Onset   Heart attack Mother    Cancer Mother 20       breast   Diabetes Mother        II   Hypertension Father    Hyperlipidemia Father    Gout Father    Healthy Sister    Healthy Brother    Healthy Brother      Current Outpatient Medications:    atorvastatin (LIPITOR) 80 MG tablet, Take 1 tablet (80 mg total) by mouth daily., Disp: 90 tablet, Rfl: 1   BD PEN NEEDLE NANO 2ND GEN 32G X 4 MM MISC, USE DAILY FOR GLUCOSE CONTROL, Disp: 100 each, Rfl: 3   blood glucose meter kit and supplies, Dispense based on patient and insurance preference. Use up to four times daily as directed. (FOR ICD-10 E10.9, E11.9)., Disp: 1 each, Rfl: 0   Continuous Blood Gluc Receiver (FREESTYLE LIBRE 14 DAY READER) DEVI, 1 each by Does not apply route daily., Disp: 1 each, Rfl: 2   Continuous Blood Gluc Sensor (DEXCOM G7 SENSOR) MISC, 1 Device by  Does not apply route as directed. Change sensor every 10 days, Disp: 3 each, Rfl: 3   insulin glargine, 1 Unit Dial, (TOUJEO SOLOSTAR) 300 UNIT/ML Solostar Pen, Inject 20 Units into the skin daily., Disp: 15 mL, Rfl: 1   Insulin Syringe-Needle U-100 27G X 1/2" 1 ML MISC, Use daily for glucose control.  DxE11.9, Disp: 100 each, Rfl: 3   metFORMIN (GLUCOPHAGE-XR) 500 MG 24 hr tablet, Take 2 tablets (1,000 mg total) by mouth daily., Disp: 180 tablet, Rfl: 3   norethindrone (MICRONOR) 0.35 MG tablet, Take 1 tablet (0.35 mg total) by mouth daily., Disp: 84 tablet, Rfl: 4   NOVOLOG FLEXPEN 100 UNIT/ML FlexPen, SMARTSIG:4 Unit(s) SUB-Q 3 Times Daily, Disp: 15 mL, Rfl: 11   sertraline (ZOLOFT) 100 MG tablet, Take 1 tablet (100 mg total) by  mouth daily. APPT NEEDED FOR FUTURE REFILLS, Disp: 30 tablet, Rfl: 0   TRULICITY 3 GG/2.6RS SOPN, Inject into the skin., Disp: , Rfl:   Review of Systems:  Constitutional: Denies fever, chills, diaphoresis, appetite change and fatigue.  HEENT: Denies photophobia, eye pain, redness, hearing loss, ear pain, congestion, sore throat, rhinorrhea, sneezing, mouth sores, trouble swallowing, neck pain, neck stiffness and tinnitus.   Respiratory: Denies SOB, DOE, cough, chest tightness,  and wheezing.   Cardiovascular: Denies chest pain, palpitations and leg swelling.  Gastrointestinal: Denies nausea, vomiting, abdominal pain, diarrhea, constipation, blood in stool and abdominal distention.  Genitourinary: Denies dysuria, urgency, frequency, hematuria, flank pain and difficulty urinating.  Endocrine: Denies: hot or cold intolerance, sweats, changes in hair or nails, polyuria, polydipsia. Musculoskeletal: Denies myalgias, back pain, joint swelling, arthralgias and gait problem.  Skin: Denies pallor, rash and wound.  Neurological: Denies dizziness, seizures, syncope, weakness, light-headedness, numbness and headaches.  Hematological: Denies adenopathy. Easy bruising, personal or family bleeding history  Psychiatric/Behavioral: Denies suicidal ideation, mood changes, confusion, nervousness, sleep disturbance and agitation    Physical Exam: Vitals:   01/02/22 1332  BP: 120/80  Pulse: (!) 105  Temp: 98.1 F (36.7 C)  TempSrc: Oral  SpO2: 100%  Weight: 168 lb 9.6 oz (76.5 kg)    Body mass index is 29.17 kg/m.   Constitutional: NAD, calm, comfortable Eyes: PERRL, lids and conjunctivae normal ENMT: Mucous membranes are moist.   Psychiatric: Normal judgment and insight. Alert and oriented x 3. Normal mood.    Impression and Plan:  Hyperlipidemia associated with type 2 diabetes mellitus ([place])  Type 2 diabetes mellitus with hyperglycemia, with long-term current use of insulin ([facility]) - Plan:  POCT glycosylated hemoglobin (Hb A1C)  -I have explained to her that I will need to see appointment reports from the days that she requested absences from her job in order to be able to medically write them off work.  I have spent a large amount of time today counseling her on potential complications related to diabetes and importance of routine medical care.  She states she will call her endocrinologist office and reschedule appointment, I will see her back in 6 months for annual physical.  She will submit office reports in order for me to excuse her absences from work with her employer.  Time spent:30 minutes reviewing chart, interviewing and examining patient and formulating plan of care.     Lelon Frohlich, MD [facility]

## 2022-01-03 ENCOUNTER — Other Ambulatory Visit: Payer: Self-pay

## 2022-01-03 DIAGNOSIS — Z794 Long term (current) use of insulin: Secondary | ICD-10-CM

## 2022-02-14 ENCOUNTER — Other Ambulatory Visit (INDEPENDENT_AMBULATORY_CARE_PROVIDER_SITE_OTHER): Payer: 59

## 2022-02-14 DIAGNOSIS — E1065 Type 1 diabetes mellitus with hyperglycemia: Secondary | ICD-10-CM

## 2022-02-14 DIAGNOSIS — E1169 Type 2 diabetes mellitus with other specified complication: Secondary | ICD-10-CM | POA: Diagnosis not present

## 2022-02-14 DIAGNOSIS — E785 Hyperlipidemia, unspecified: Secondary | ICD-10-CM | POA: Diagnosis not present

## 2022-02-14 LAB — LDL CHOLESTEROL, DIRECT: Direct LDL: 64 mg/dL

## 2022-02-14 LAB — BASIC METABOLIC PANEL
BUN: 9 mg/dL (ref 6–23)
CO2: 26 mEq/L (ref 19–32)
Calcium: 9.4 mg/dL (ref 8.4–10.5)
Chloride: 95 mEq/L — ABNORMAL LOW (ref 96–112)
Creatinine, Ser: 0.82 mg/dL (ref 0.40–1.20)
GFR: 86.14 mL/min (ref >60.00)
Glucose, Bld: 296 mg/dL — ABNORMAL HIGH (ref 70–99)
Potassium: 3.8 mEq/L (ref 3.5–5.1)
Sodium: 131 mEq/L — ABNORMAL LOW (ref 135–145)

## 2022-02-16 LAB — FRUCTOSAMINE: Fructosamine: 459 umol/L — ABNORMAL HIGH (ref 0–285)

## 2022-02-18 ENCOUNTER — Ambulatory Visit (INDEPENDENT_AMBULATORY_CARE_PROVIDER_SITE_OTHER): Payer: 59 | Admitting: Endocrinology

## 2022-02-18 ENCOUNTER — Encounter: Payer: Self-pay | Admitting: Endocrinology

## 2022-02-18 VITALS — BP 102/70 | HR 100 | Ht 63.0 in | Wt 172.0 lb

## 2022-02-18 DIAGNOSIS — E1065 Type 1 diabetes mellitus with hyperglycemia: Secondary | ICD-10-CM

## 2022-02-18 DIAGNOSIS — E785 Hyperlipidemia, unspecified: Secondary | ICD-10-CM | POA: Diagnosis not present

## 2022-02-18 DIAGNOSIS — E1169 Type 2 diabetes mellitus with other specified complication: Secondary | ICD-10-CM

## 2022-02-18 DIAGNOSIS — E1069 Type 1 diabetes mellitus with other specified complication: Secondary | ICD-10-CM | POA: Diagnosis not present

## 2022-02-18 MED ORDER — FREESTYLE LIBRE 3 SENSOR MISC: 1 refills | Status: DC

## 2022-02-18 MED ORDER — TOUJEO SOLOSTAR 300 UNIT/ML ~~LOC~~ SOPN: 26.0000 [IU] | PEN_INJECTOR | Freq: Every day | SUBCUTANEOUS | 0 refills | Status: DC

## 2022-02-18 NOTE — Patient Instructions (Signed)
Change Toujeo to pm and 26 units

## 2022-02-18 NOTE — Progress Notes (Signed)
Patient ID: Samantha Willis, female   DOB: 22-Jun-1975, 47 y.o.   MRN: [id number]    Chief complaint : Follow up of Type 1 Diabetes  History of Present Illness:          Date of diagnosis: 2021  Prior history:   At the time of diagnosis she weighed about 165-170 lb Initially her symptoms were weight loss, blood sugar of over 600 and was started on insulin By history her A1c has ranged from 14.7 at diagnosis and lowest reading of 7.3 as of 8/22 and subsequently higher  Non-insulin hypoglycemic drugs previously: Metformin 1 g, Trulicity 3 mg weekly  Recent history:     Her A1c is better at 9.4 compared to 12.1  INSULIN regimen : NovoLog 3-5 units before meal, TOUJEO 20 units in the morning  Insulin usage: Has been able to take Toujeo without any headaches which she had with Basaglar  However has not come back for follow-up since June as directed Also she has not adjusted the basal insulin at all as discussed previously despite her fasting reading being consistently over 200  Mealtime shots: She is missing the dose mostly based on Premeal blood sugar but unclear what her postprandial blood sugar patterns are.  She is trying to be regular with her insulin before meals   Glucose patterns: Fasting glucose: Consistently in the low 200 range  Blood sugars are still mostly high during the day although may be less so at lunchtime especially if she is eating very small meal in the morning  Does not check readings after dinner Also she said that she has not been active or exercising She is concerned that her insurance is ending this month  Also because of her routine she has not done much cooking at home and frequently eating out Did not make appointment with diabetes educator, referral has been made She has gained weight She says she cannot afford the co-pay for the Dexcom Did not bring her monitor for download today    Glucose monitoring:  done ?  2 times a day          Glucometer: [place]      Blood Glucose reading summary from recall :    PRE-MEAL Fasting Lunch Dinner Bedtime Overall  Glucose range: 225 170 224 ?   Mean/median:         prior  PREMEAL Breakfast Lunch Dinner Bedtime Overall  Glucose range: 200+   200   Median:        POST-MEAL PC Breakfast PC Lunch PC Dinner  Glucose range: 130    Median:      Hypoglycemia: Minimal  Self-care: The diet that the patient has been following is: None         Wt Readings from Last 3 Encounters:  02/18/22 172 lb (78 kg)  01/02/22 168 lb 9.6 oz (76.5 kg)  07/11/21 161 lb 9.6 oz (73.3 kg)     Diabetes labs:  Lab Results  Component Value Date   HGBA1C 9.4 (A) 01/02/2022   HGBA1C 12.1 (A) 07/03/2021   HGBA1C 10.4 (A) 04/04/2021   Lab Results  Component Value Date   MICROALBUR <0.7 07/11/2021   [place] 105 (H) 07/11/2021   CREATININE 0.82 02/14/2022    Office Visit on 02/14/2022  Component Date Value Ref Range Status   Direct LDL 02/14/2022 64.0  mg/dL Final   Optimal:  <100 mg/dLNear or Above Optimal:  100-129 mg/dLBorderline High:  130-159 mg/dLHigh:  160-189 mg/dLVery  High:  >190 mg/dL   Fructosamine 02/14/2022 459 (H)  0 - 285 umol/L Final   Comment: Published reference interval for apparently healthy subjects between age 78 and 55 is 109 - 285 umol/L and in a poorly controlled diabetic population is 228 - 563 umol/L with a mean of 396 umol/L.    Sodium 02/14/2022 131 (L)  135 - 145 mEq/L Final   Potassium 02/14/2022 3.8  3.5 - 5.1 mEq/L Final   Chloride 02/14/2022 95 (L)  96 - 112 mEq/L Final   CO2 02/14/2022 26  19 - 32 mEq/L Final   Glucose, Bld 02/14/2022 296 (H)  70 - 99 mg/dL Final   BUN 02/14/2022 9  6 - 23 mg/dL Final   Creatinine, Ser 02/14/2022 0.82  0.40 - 1.20 mg/dL Final   GFR 02/14/2022 86.14  >60.00 mL/min Final   Calculated using the CKD-EPI Creatinine Equation (2021)   Calcium 02/14/2022 9.4  8.4 - 10.5 mg/dL Final    Allergies as of 02/18/2022   No  Known Allergies      Medication List        Accurate as of February 18, 2022  2:24 PM. If you have any questions, ask your nurse or doctor.          atorvastatin 80 MG tablet Commonly known as: LIPITOR Take 1 tablet (80 mg total) by mouth daily.   BD Pen Needle Nano 2nd Gen 32G X 4 MM Misc Generic drug: Insulin Pen Needle USE DAILY FOR GLUCOSE CONTROL   blood glucose meter kit and supplies Dispense based on patient and insurance preference. Use up to four times daily as directed. (FOR ICD-10 E10.9, E11.9).   Dexcom G7 Sensor Misc 1 Device by Does not apply route as directed. Change sensor every 10 days   FreeStyle Libre 14 Day Reader Kerrin Mo 1 each by Does not apply route daily.   Insulin Syringe-Needle U-100 27G X 1/2" 1 ML Misc Use daily for glucose control.  DxE11.9   metFORMIN 500 MG 24 hr tablet Commonly known as: GLUCOPHAGE-XR Take 2 tablets (1,000 mg total) by mouth daily.   norethindrone 0.35 MG tablet Commonly known as: MICRONOR Take 1 tablet (0.35 mg total) by mouth daily.   NovoLOG FlexPen 100 UNIT/ML FlexPen Generic drug: insulin aspart SMARTSIG:4 Unit(s) SUB-Q 3 Times Daily What changed: how much to take   sertraline 100 MG tablet Commonly known as: ZOLOFT Take 1 tablet (100 mg total) by mouth daily. APPT NEEDED FOR FUTURE REFILLS   Toujeo SoloStar 300 UNIT/ML Solostar Pen Generic drug: insulin glargine (1 Unit Dial) Inject 20 Units into the skin daily.   Trulicity 3 PI/9.5JO Sopn Generic drug: Dulaglutide Inject into the skin.        Allergies: No Known Allergies  Past Medical History:  Diagnosis Date   Anxiety    Carpal tunnel syndrome 05/06/2014    Bilateral   Diabetic ketoacidosis ([place]) 11/17/2019   GERD (gastroesophageal reflux disease)    Graves disease    Graves Disease   Heart palpitations    Obesity     Past Surgical History:  Procedure Laterality Date   Biopsy of Right Breast     At [place] Right 11/21/2015   Procedure: CARPAL TUNNEL RELEASE right;  Surgeon: Daryll Brod, MD;  Location: [facility];  Service: Orthopedics;  Laterality: Right;  FAB   CARPAL TUNNEL RELEASE Left 06/11/2016   Procedure: LEFT CARPAL TUNNEL RELEASE;  Surgeon: Daryll Brod, MD;  Location: [facility];  Service: Orthopedics;  Laterality: Left;  REG/FAB   CESAREAN SECTION     IUD REMOVAL     TUBAL LIGATION  2011   WISDOM TOOTH EXTRACTION      Family History  Problem Relation Age of Onset   Heart attack Mother    Cancer Mother 30       breast   Diabetes Mother        II   Hypertension Father    Hyperlipidemia Father    Gout Father    Healthy Sister    Healthy Brother    Healthy Brother     Social History:  reports that she quit smoking about 2 years ago. Her smoking use included cigarettes. She smoked an average of .25 packs per day. She has never used smokeless tobacco. She reports that she does not currently use alcohol. She reports that she does not use drugs.    Review of Systems:   Blood pressure: Consistently normal  BP Readings from Last 3 Encounters:  02/18/22 102/70  01/02/22 120/80  07/11/21 (!) 106/58     Lipids: Has had high lipids previously but not on statin from her PCP  Lab Results  Component Value Date   CHOL 196 07/11/2021   HDL 80.20 07/11/2021   LDLCALC 105 (H) 07/11/2021   LDLDIRECT 64.0 02/14/2022   TRIG 56.0 07/11/2021   CHOLHDL 2 07/11/2021     Diabetes complications: None known  She is on birth control pill from her gynecologist   Physical Examination:  BP 102/70   Pulse 100   Ht '5\' 3"'$  (1.6 m)   Wt 172 lb (78 kg)   BMI 30.47 kg/m         ASSESSMENT/PLAN   Diabetes type 1 with persistently poor control   Her A1c is 9.4  Although she is on basal bolus insulin she appears to be on inadequate insulin especially basal  Problems identified: Not on adequate basal insulin with persistently high fasting  and Premeal readings at dinnertime Inadequate glucose monitoring Although she likely can benefit from CGM she apparently cannot afford it Difficult to adjust her mealtime dose because of lack of data regarding postprandial blood sugars Does need better understanding of diabetes management including mealtime adjustment Can do better with diet with avoiding fast food and high fat meals is also moderating carbohydrate Will benefit from carbohydrate counting  Needs to start regular exercise  Other recommendations: Toujeo 26 units after dinner instead of 20 units in am Titration sheet given to adjust the dose by 2 units every 3 days to get blood sugars below 130 fasting Also if blood sugars are better in the morning but higher at dinnertime may consider twice a day insulin To check blood sugars and monitor them regularly along with bringing her meter for download in the next visit to help adjust her mealtime dose Discussed that blood sugar should not be over 180 after meals or at least not rising more than 60 mg   We will try to get her monitoring started with libre 3 sensor if affordable, prescription sent  She will need to follow-up with the diabetes educator once she has insurance coverage    MICROALBUMIN to be checked when blood sugars are better controlled    Total visit time including counseling = 30 minutes                   There are  no Patient Instructions on file for this visit.   Samantha Willis 02/18/2022, 2:24 PM

## 2022-07-04 ENCOUNTER — Encounter: Payer: 59 | Admitting: Internal Medicine

## 2022-09-03 ENCOUNTER — Other Ambulatory Visit: Payer: 59

## 2022-09-03 DIAGNOSIS — E785 Hyperlipidemia, unspecified: Secondary | ICD-10-CM

## 2022-09-03 DIAGNOSIS — E1065 Type 1 diabetes mellitus with hyperglycemia: Secondary | ICD-10-CM

## 2022-09-03 DIAGNOSIS — E1169 Type 2 diabetes mellitus with other specified complication: Secondary | ICD-10-CM

## 2022-09-05 ENCOUNTER — Encounter: Payer: Self-pay | Admitting: Endocrinology

## 2022-09-05 ENCOUNTER — Ambulatory Visit (INDEPENDENT_AMBULATORY_CARE_PROVIDER_SITE_OTHER): Payer: BC Managed Care – PPO | Admitting: Endocrinology

## 2022-09-05 VITALS — BP 120/70 | HR 109 | Ht 63.0 in | Wt 164.6 lb

## 2022-09-05 DIAGNOSIS — E1065 Type 1 diabetes mellitus with hyperglycemia: Secondary | ICD-10-CM | POA: Diagnosis not present

## 2022-09-05 LAB — GLUCOSE, POCT (MANUAL RESULT ENTRY): POC Glucose: 406 mg/dl — AB (ref 70–99)

## 2022-09-05 MED ORDER — INSULIN PEN NEEDLE 32G X 4 MM MISC: 3 refills | Status: DC

## 2022-09-05 MED ORDER — NOVOLOG FLEXPEN 100 UNIT/ML ~~LOC~~ SOPN
PEN_INJECTOR | SUBCUTANEOUS | 11 refills | Status: DC
Start: 2022-09-05 — End: 2023-01-02

## 2022-09-05 MED ORDER — METFORMIN HCL ER 500 MG PO TB24
1000.0000 mg | ORAL_TABLET | Freq: Every day | ORAL | 3 refills | Status: DC
Start: 2022-09-05 — End: 2023-05-06

## 2022-09-05 MED ORDER — FREESTYLE LIBRE 3 SENSOR MISC: 1.0000 | 3 refills | Status: DC

## 2022-09-05 MED ORDER — TOUJEO SOLOSTAR 300 UNIT/ML ~~LOC~~ SOPN: 25.0000 [IU] | PEN_INJECTOR | Freq: Every day | SUBCUTANEOUS | 11 refills | Status: DC

## 2022-09-05 NOTE — Progress Notes (Signed)
Outpatient Endocrinology Note Samantha Dilara Navarrete, MD  09/05/22  Patient's Name: Samantha Willis    DOB: [birth year]    MRN: [id number]                                                    REASON OF VISIT: Follow up  for type 1 diabetes mellitus  PCP: Philip Aspen, Limmie Patricia, MD  HISTORY OF PRESENT ILLNESS:   Samantha Willis is a 47 y.o. old female with past medical history listed below, is here for follow up of type 1 diabetes mellitus.  Patient was last seen by Dr. Lucianne Muss in January 2024.  She used to follow-up with Dr. Everardo All in the past.  Pertinent Diabetes History: She was diagnosed with type 1 diabetes mellitus in 2021.  She was initially presented with diabetes ketoacidosis.  She used to be on metformin and Trulicity in the past.  She has been on insulin therapy since her diagnosis.  Hemoglobin A1c was 14.7% at the time of diagnosis.  She has remained uncontrolled diabetes with hemoglobin A1c in the range of 7.3 to 14.7%, mostly in the range of 8 to 11%.  There was a plan to taper off the insulin in March 2022 per patient request, at some point patient also consider pump.  Patient reports Trulicity was later stopped by Dr. Everardo All.  History of DKA or diabetes related hospitalizations: Yes /at the time of diagnosis in October 2021 and in May 2023.  Previous diabetes education: Yes   Chronic Diabetes Complications : Retinopathy: no. Last ophthalmology exam was done on annually, reportedly. Nephropathy: no Peripheral neuropathy: no Coronary artery disease: no Stroke: no  Relevant comorbidities and cardiovascular risk factors: Obesity: no Body mass index is 29.16 kg/m.  Hypertension: yes Hyperlipidemia. yes  Current / Home Diabetic regimen includes: NovoLog 10 units 2 times a day. Metformin 500 mg extended release 1 tablet 2 times a day.  Currently not taking Toujeo.  Prior diabetic medications: Trulicity, stopped in 2023.  Glycemic data:   Did not bring  glucometer to review.  She has been rarely checking blood sugar and reports has been about 300-400 range.  Hypoglycemia: Patient has no hypoglycemic episodes. Patient has hypoglycemia awareness.  Factors modifying glucose control: 1.  Diabetic diet assessment: Mostly 2 meals a day.  2.  Staying active or exercising: No formal exercise.  3.  Medication compliance: compliant some of the time.  Interval history 09/05/22 Patient has not been taking Toujeo/basal insulin from April / May of this year.  She had to travel for the work to [place] and did not have insurance, not able to afford for insulin.  However she was taking NovoLog at least 2 times a day.  She now has medical insurance.  She denies any sickness or illness when not taking Toujeo.  Hemoglobin A1c 10.8% today.  REVIEW OF SYSTEMS As per history of present illness.   PAST MEDICAL HISTORY: Past Medical History:  Diagnosis Date   Anxiety    Carpal tunnel syndrome 05/06/2014    Bilateral   Diabetic ketoacidosis (HCC) 11/17/2019   GERD (gastroesophageal reflux disease)    Graves disease    Graves Disease   Heart palpitations    Obesity     PAST SURGICAL HISTORY: Past Surgical History:  Procedure Laterality Date   Biopsy of Right  Breast     At [facility]   CARPAL TUNNEL RELEASE Right 11/21/2015   Procedure: CARPAL TUNNEL RELEASE right;  Surgeon: Cindee Salt, MD;  Location: [facility];  Service: Orthopedics;  Laterality: Right;  FAB   CARPAL TUNNEL RELEASE Left 06/11/2016   Procedure: LEFT CARPAL TUNNEL RELEASE;  Surgeon: Cindee Salt, MD;  Location:  SURGERY CENTER;  Service: Orthopedics;  Laterality: Left;  REG/FAB   CESAREAN SECTION     IUD REMOVAL     TUBAL LIGATION  2011   WISDOM TOOTH EXTRACTION      ALLERGIES: No Known Allergies  FAMILY HISTORY:  Family History  Problem Relation Age of Onset   Heart attack Mother    Cancer Mother 22       breast   Diabetes Mother        II    Hypertension Father    Hyperlipidemia Father    Gout Father    Healthy Sister    Healthy Brother    Healthy Brother     SOCIAL HISTORY: Social History   Socioeconomic History   Marital status: Married    Spouse name: Not on file   Number of children: 2   Years of education: some col.   Highest education level: Not on file  Occupational History   Occupation: unemployed  Tobacco Use   Smoking status: Former    Current packs/day: 0.00    Types: Cigarettes    Quit date: 11/10/2019    Years since quitting: 2.8   Smokeless tobacco: Never  Vaping Use   Vaping status: Never Used  Substance and Sexual Activity   Alcohol use: Not Currently    Comment: Occassionally   Drug use: No   Sexual activity: Yes    Partners: Male    Birth control/protection: Surgical    Comment: btl  Other Topics Concern   Not on file  Social History Narrative   Lives at home w/ her fiance, son, brother and dad   Patient drinks 1 soda per week.   Patient is right handed   Social Determinants of Health   Financial Resource Strain: Not on file  Food Insecurity: Not on file  Transportation Needs: Not on file  Physical Activity: Not on file  Stress: Not on file  Social Connections: Not on file    MEDICATIONS:  Current Outpatient Medications  Medication Sig Dispense Refill   atorvastatin (LIPITOR) 80 MG tablet Take 1 tablet (80 mg total) by mouth daily. 90 tablet 1   BD PEN NEEDLE NANO 2ND GEN 32G X 4 MM MISC USE DAILY FOR GLUCOSE CONTROL 100 each 3   blood glucose meter kit and supplies Dispense based on patient and insurance preference. Use up to four times daily as directed. (FOR ICD-10 E10.9, E11.9). 1 each 0   Continuous Blood Gluc Receiver (FREESTYLE LIBRE 14 DAY READER) DEVI 1 each by Does not apply route daily. 1 each 2   Continuous Blood Gluc Sensor (FREESTYLE LIBRE 3 SENSOR) MISC Place 1 sensor on the skin every 14 days. Use to check glucose continuously 2 each 1   Continuous Glucose  Sensor (FREESTYLE LIBRE 3 SENSOR) MISC 1 Device by Does not apply route continuous. 6 each 3   Insulin Pen Needle 32G X 4 MM MISC Use 4x a day 300 each 3   Insulin Syringe-Needle U-100 27G X 1/2" 1 ML MISC Use daily for glucose control.  DxE11.9 100 each 3   norethindrone (MICRONOR)  0.35 MG tablet Take 1 tablet (0.35 mg total) by mouth daily. 84 tablet 4   sertraline (ZOLOFT) 100 MG tablet Take 1 tablet (100 mg total) by mouth daily. APPT NEEDED FOR FUTURE REFILLS 30 tablet 0   insulin glargine, 1 Unit Dial, (TOUJEO SOLOSTAR) 300 UNIT/ML Solostar Pen Inject 25 Units into the skin daily. 15 mL 11   metFORMIN (GLUCOPHAGE-XR) 500 MG 24 hr tablet Take 2 tablets (1,000 mg total) by mouth daily. 180 tablet 3   NOVOLOG FLEXPEN 100 UNIT/ML FlexPen Take 8 units with meals three times a day. 15 mL 11   No current facility-administered medications for this visit.    PHYSICAL EXAM: Vitals:   09/05/22 1528  BP: 120/70  Pulse: (!) 109  SpO2: 97%  Weight: 164 lb 9.6 oz (74.7 kg)  Height: 5\' 3"  (1.6 m)   Body mass index is 29.16 kg/m.  Wt Readings from Last 3 Encounters:  09/05/22 164 lb 9.6 oz (74.7 kg)  02/18/22 172 lb (78 kg)  01/02/22 168 lb 9.6 oz (76.5 kg)    General: Well developed, well nourished female in no apparent distress.  HEENT: AT/[place], no external lesions.  Eyes: Conjunctiva clear and no icterus. Neck: Neck supple  Lungs: Respirations not labored Neurologic: Alert, oriented, normal speech Extremities / Skin: Dry. No sores or rashes noted. No acanthosis nigricans Psychiatric: Does not appear depressed or anxious  Diabetic Foot Exam - Simple   No data filed    LABS Reviewed Lab Results  Component Value Date   HGBA1C 10.8 (H) 09/03/2022   HGBA1C 9.4 (A) 01/02/2022   HGBA1C 12.1 (A) 07/03/2021   Lab Results  Component Value Date   FRUCTOSAMINE 459 (H) 02/14/2022   Lab Results  Component Value Date   CHOL 228 (H) 09/03/2022   HDL 75.50 09/03/2022   LDLCALC 129 (H)  09/03/2022   LDLDIRECT 64.0 02/14/2022   TRIG 115.0 09/03/2022   CHOLHDL 3 09/03/2022   Lab Results  Component Value Date   MICRALBCREAT 0.5 07/11/2021   Lab Results  Component Value Date   CREATININE 0.83 09/03/2022   Lab Results  Component Value Date   GFR 84.57 09/03/2022    ASSESSMENT / PLAN  1. Uncontrolled type 1 diabetes mellitus with hyperglycemia, with long-term current use of insulin (HCC)   2. Type 1 diabetes mellitus with hyperglycemia (HCC)     Diabetes Mellitus type 1, no known complications. - Diabetic status / severity: Uncontrolled  Lab Results  Component Value Date   HGBA1C 10.8 (H) 09/03/2022    - Hemoglobin A1c goal : <7%  - Medications: Patient has not been taking basal/Toujeo due to no insurance.  She has the medical insurance now.  Will resume basal insulin and continue with the bolus insulin.  Adjusted diabetes regimen as follows.  I) start Toujeo 25 units daily. II) NovoLog 8 units with meals 3 times a day. III) continue metformin 500 mg extended release 1 tablet 2 times a day.  Patient has history of diabetes ketoacidosis.  She is being treated as type 1 diabetes mellitus.  Patient thought she has type 2 diabetes mellitus.  Discussed that she most likely has type 1 diabetes mellitus.  Discussed that she is insulin-dependent.  Discussed about compliance of insulin.  Discussed that not taking basal insulin and other insulin can develop diabetic ketoacidosis and which can be life-threatening. -I will check type 1 diabetes autoimmune panel, C-peptide and random glucose today.  - Home glucose testing: With meals  3 times a day and at bedtime.  Prescription for freestyle libre 3 sent. - Discussed/ Gave Hypoglycemia treatment plan.  # Consult : not required at this time.   # Annual urine for microalbuminuria/ creatinine ratio, no microalbuminuria currently, will check today. Last  Lab Results  Component Value Date   MICRALBCREAT 0.5 07/11/2021     # Foot check nightly.  # Annual dilated diabetic eye exams.   - Diet: Make healthy diabetic food choices - Life style / activity / exercise: Discussed.  2. Blood pressure  -  BP Readings from Last 1 Encounters:  09/05/22 120/70    - Control is in target.  - No change in current plans.  3. Lipid status / Hyperlipidemia - Last  Lab Results  Component Value Date   LDLCALC 129 (H) 09/03/2022   - Continue atorvastatin 40 mg daily.  Diagnoses and all orders for this visit:  Uncontrolled type 1 diabetes mellitus with hyperglycemia, with long-term current use of insulin (HCC) -     POCT glucose (manual entry) -     C-peptide; Future -     Glucose, random; Future -     IA-2 Antibody; Future -     ZNT8 Antibodies; Future -     Glutamic acid decarboxylase auto abs; Future -     Microalbumin / creatinine urine ratio; Future -     Microalbumin / creatinine urine ratio -     Glutamic acid decarboxylase auto abs -     ZNT8 Antibodies -     IA-2 Antibody -     Glucose, random -     C-peptide  Type 1 diabetes mellitus with hyperglycemia (HCC)  Other orders -     insulin glargine, 1 Unit Dial, (TOUJEO SOLOSTAR) 300 UNIT/ML Solostar Pen; Inject 25 Units into the skin daily. -     metFORMIN (GLUCOPHAGE-XR) 500 MG 24 hr tablet; Take 2 tablets (1,000 mg total) by mouth daily. -     NOVOLOG FLEXPEN 100 UNIT/ML FlexPen; Take 8 units with meals three times a day. -     Insulin Pen Needle 32G X 4 MM MISC; Use 4x a day -     Continuous Glucose Sensor (FREESTYLE LIBRE 3 SENSOR) MISC; 1 Device by Does not apply route continuous.   DISPOSITION Follow up in clinic in 3 months suggested.   All questions answered and patient verbalized understanding of the plan.  Samantha Ahnika Hannibal, MD [facility] Endocrinology [facility] [address] [place], [address] [place], [place] [address] Phone # [phone]  At least part of this note was generated using voice recognition software.  Inadvertent word errors may have occurred, which were not recognized during the proofreading process.

## 2022-09-05 NOTE — Patient Instructions (Signed)
Diabetes regimen: Tuojeo 25 units daily. Novolog 8 units with meals three times a day.  Bring meter in follow up visit.  Libre 3 sent to your pharmacy.  Labs today.

## 2022-12-03 ENCOUNTER — Other Ambulatory Visit: Payer: Self-pay

## 2022-12-03 ENCOUNTER — Telehealth: Payer: Self-pay | Admitting: Endocrinology

## 2022-12-03 DIAGNOSIS — E1065 Type 1 diabetes mellitus with hyperglycemia: Secondary | ICD-10-CM

## 2022-12-03 MED ORDER — TOUJEO SOLOSTAR 300 UNIT/ML ~~LOC~~ SOPN: 25.0000 [IU] | PEN_INJECTOR | Freq: Every day | SUBCUTANEOUS | 11 refills | Status: DC

## 2022-12-03 NOTE — Telephone Encounter (Signed)
Patient advising sh has no refills on her Toujeo. Please call [facility] on [address] and advise patient

## 2023-01-02 ENCOUNTER — Encounter: Payer: Self-pay | Admitting: Endocrinology

## 2023-01-02 ENCOUNTER — Other Ambulatory Visit: Payer: Self-pay | Admitting: Endocrinology

## 2023-01-02 ENCOUNTER — Ambulatory Visit: Payer: BC Managed Care – PPO | Admitting: Endocrinology

## 2023-01-02 VITALS — BP 108/60 | HR 93 | Resp 20 | Ht 63.0 in | Wt 175.8 lb

## 2023-01-02 DIAGNOSIS — Z8639 Personal history of other endocrine, nutritional and metabolic disease: Secondary | ICD-10-CM

## 2023-01-02 DIAGNOSIS — E1065 Type 1 diabetes mellitus with hyperglycemia: Secondary | ICD-10-CM

## 2023-01-02 LAB — POCT GLYCOSYLATED HEMOGLOBIN (HGB A1C): Hemoglobin A1C: 9 % — AB (ref 4.0–5.6)

## 2023-01-02 MED ORDER — FREESTYLE LIBRE 3 PLUS SENSOR MISC: 1.0000 | 4 refills | Status: DC

## 2023-01-02 MED ORDER — NOVOLOG FLEXPEN 100 UNIT/ML ~~LOC~~ SOPN: PEN_INJECTOR | SUBCUTANEOUS | 11 refills | Status: DC

## 2023-01-02 NOTE — Progress Notes (Signed)
Outpatient Endocrinology Note [place] Marilouise Densmore, MD  01/02/23  Patient's Name: Samantha Willis    DOB: 07/14/1975    MRN: [id number]                                                    REASON OF VISIT: Follow up  for type 1 diabetes mellitus  PCP: Philip Aspen, Limmie Patricia, MD  HISTORY OF PRESENT ILLNESS:   Samantha Willis is a 47 y.o. old female with past medical history listed below, is here for follow up of type 1 diabetes mellitus.    Pertinent Diabetes History: She was diagnosed with type 1 diabetes mellitus in 2021.  She was initially presented with diabetes ketoacidosis. Hemoglobin A1c was 14.7% at the time of diagnosis.  She has remained uncontrolled diabetes with hemoglobin A1c in the range of 7.3 to 14.7%, mostly in the range of 8 to 11%.  Patient has positive antiforty 4 GAD 65, zinc transporter 8 consistent with type 1 diabetes mellitus.Marland Kitchen  History of DKA or diabetes related hospitalizations: Yes /at the time of diagnosis in October 2021 and in May 2023.  Previous diabetes education: Yes   Chronic Diabetes Complications : Retinopathy: no. Last ophthalmology exam was done on annually, reportedly. Nephropathy: no Peripheral neuropathy: no Coronary artery disease: no Stroke: no  Relevant comorbidities and cardiovascular risk factors: Obesity: no Body mass index is 31.14 kg/m.  Hypertension: yes Hyperlipidemia. yes  Current / Home Diabetic regimen includes: - Toujeo 25 units daily. - NovoLog 5 units with meals 3 times a day.  Prior diabetic medications: Trulicity, stopped in 2023.  Had taken metformin in the past.  Glycemic data:   Did not bring glucometer to review.  She reports after being on Toujeo she has fasting blood sugar in low 100s.  Reported hypoglycemia of blood sugar 64 when eating less.  Low 100s in the morning fasting.   Hypoglycemia: Patient has unknown hypoglycemic episodes. Patient has hypoglycemia awareness.  Factors modifying  glucose control: 1.  Diabetic diet assessment: Mostly 2 - 3 meals a day.  2.  Staying active or exercising: No formal exercise.  3.  Medication compliance: compliant some of the time.  Interval history  Patient has hemoglobin A1c 9% today improved.  She could not refill freestyle libre sensor.  However she has been checking blood sugar with glucometer sitting glucometer with her husband and did not bring the glucometer in the clinic today.  She reports compliance with Toujeo and NovoLog.  She has no other complaints today.  REVIEW OF SYSTEMS As per history of present illness.   PAST MEDICAL HISTORY: Past Medical History:  Diagnosis Date   Anxiety    Carpal tunnel syndrome 05/06/2014    Bilateral   Diabetic ketoacidosis (HCC) 11/17/2019   GERD (gastroesophageal reflux disease)    Graves disease    Graves Disease   Heart palpitations    Obesity     PAST SURGICAL HISTORY: Past Surgical History:  Procedure Laterality Date   Biopsy of Right Breast     At [facility]   CARPAL TUNNEL RELEASE Right 11/21/2015   Procedure: CARPAL TUNNEL RELEASE right;  Surgeon: Cindee Salt, MD;  Location: [facility];  Service: Orthopedics;  Laterality: Right;  FAB   CARPAL TUNNEL RELEASE Left 06/11/2016   Procedure: LEFT CARPAL  TUNNEL RELEASE;  Surgeon: Cindee Salt, MD;  Location: [facility];  Service: Orthopedics;  Laterality: Left;  REG/FAB   CESAREAN SECTION     IUD REMOVAL     TUBAL LIGATION  2011   WISDOM TOOTH EXTRACTION      ALLERGIES: No Known Allergies  FAMILY HISTORY:  Family History  Problem Relation Age of Onset   Heart attack Mother    Cancer Mother 36       breast   Diabetes Mother        II   Hypertension Father    Hyperlipidemia Father    Gout Father    Healthy Sister    Healthy Brother    Healthy Brother     SOCIAL HISTORY: Social History   Socioeconomic History   Marital status: Married    Spouse name: Not on file   Number  of children: 2   Years of education: some col.   Highest education level: Not on file  Occupational History   Occupation: unemployed  Tobacco Use   Smoking status: Former    Current packs/day: 0.00    Types: Cigarettes    Quit date: 11/10/2019    Years since quitting: 3.1   Smokeless tobacco: Never  Vaping Use   Vaping status: Never Used  Substance and Sexual Activity   Alcohol use: Not Currently    Comment: Occassionally   Drug use: No   Sexual activity: Yes    Partners: Male    Birth control/protection: Surgical    Comment: btl  Other Topics Concern   Not on file  Social History Narrative   Lives at home w/ her fiance, son, brother and dad   Patient drinks 1 soda per week.   Patient is right handed   Social Determinants of Health   Financial Resource Strain: Not on file  Food Insecurity: Not on file  Transportation Needs: Not on file  Physical Activity: Not on file  Stress: Not on file  Social Connections: Not on file    MEDICATIONS:  Current Outpatient Medications  Medication Sig Dispense Refill   atorvastatin (LIPITOR) 80 MG tablet Take 1 tablet (80 mg total) by mouth daily. 90 tablet 1   BD PEN NEEDLE NANO 2ND GEN 32G X 4 MM MISC USE DAILY FOR GLUCOSE CONTROL 100 each 3   blood glucose meter kit and supplies Dispense based on patient and insurance preference. Use up to four times daily as directed. (FOR ICD-10 E10.9, E11.9). 1 each 0   Continuous Blood Gluc Receiver (FREESTYLE LIBRE 14 DAY READER) DEVI 1 each by Does not apply route daily. 1 each 2   Continuous Glucose Sensor (FREESTYLE LIBRE 3 PLUS SENSOR) MISC 1 each by Does not apply route continuous. Change every 15 days. 6 each 4   insulin glargine, 1 Unit Dial, (TOUJEO SOLOSTAR) 300 UNIT/ML Solostar Pen Inject 25 Units into the skin daily. 15 mL 11   Insulin Pen Needle 32G X 4 MM MISC Use 4x a day 300 each 3   Insulin Syringe-Needle U-100 27G X 1/2" 1 ML MISC Use daily for glucose control.  DxE11.9 100 each  3   metFORMIN (GLUCOPHAGE-XR) 500 MG 24 hr tablet Take 2 tablets (1,000 mg total) by mouth daily. 180 tablet 3   sertraline (ZOLOFT) 100 MG tablet Take 1 tablet (100 mg total) by mouth daily. APPT NEEDED FOR FUTURE REFILLS 30 tablet 0   norethindrone (MICRONOR) 0.35 MG tablet Take 1 tablet (0.35 mg total)  by mouth daily. 84 tablet 4   NOVOLOG FLEXPEN 100 UNIT/ML FlexPen Take 5-8 units with meals three times a day, maximum 30 units/day. 15 mL 11   No current facility-administered medications for this visit.    PHYSICAL EXAM: Vitals:   01/02/23 1609  BP: 108/60  Pulse: 93  Resp: 20  SpO2: 99%  Weight: 175 lb 12.8 oz (79.7 kg)  Height: 5\' 3"  (1.6 m)    Body mass index is 31.14 kg/m.  Wt Readings from Last 3 Encounters:  01/02/23 175 lb 12.8 oz (79.7 kg)  09/05/22 164 lb 9.6 oz (74.7 kg)  02/18/22 172 lb (78 kg)    General: Well developed, well nourished female in no apparent distress.  HEENT: AT/[place], no external lesions.  Eyes: Conjunctiva clear and no icterus. Neck: Neck supple  Lungs: Respirations not labored Neurologic: Alert, oriented, normal speech Extremities / Skin: Dry. No sores or rashes noted.  Psychiatric: Does not appear depressed or anxious  Diabetic Foot Exam - Simple   Simple Foot Form Diabetic Foot exam was performed with the following findings: Yes 01/02/2023  4:30 PM  Visual Inspection No deformities, no ulcerations, no other skin breakdown bilaterally: Yes Sensation Testing Intact to touch and monofilament testing bilaterally: Yes Pulse Check Posterior Tibialis and Dorsalis pulse intact bilaterally: Yes Comments    LABS Reviewed Lab Results  Component Value Date   HGBA1C 9.0 (A) 01/02/2023   HGBA1C 10.8 (H) 09/03/2022   HGBA1C 9.4 (A) 01/02/2022   Lab Results  Component Value Date   FRUCTOSAMINE 459 (H) 02/14/2022   Lab Results  Component Value Date   CHOL 228 (H) 09/03/2022   HDL 75.50 09/03/2022   LDLCALC 129 (H) 09/03/2022   LDLDIRECT  64.0 02/14/2022   TRIG 115.0 09/03/2022   CHOLHDL 3 09/03/2022   Lab Results  Component Value Date   MICRALBCREAT 1.4 09/05/2022   MICRALBCREAT 0.5 07/11/2021   Lab Results  Component Value Date   CREATININE 0.83 09/03/2022   Lab Results  Component Value Date   GFR 84.57 09/03/2022    Latest Reference Range & Units 09/05/22 15:48  Glucose 70 - 99 mg/dL 098 (H)  IA-2 Antibody <5.4 U/mL <5.4  ZNT8 Antibodies <15 U/mL 47 (H)  Glutamic Acid Decarb Ab <5 IU/mL >250 (H)  C-Peptide 0.80 - 3.85 ng/mL 0.41 (L)  (H): Data is abnormally high (L): Data is abnormally low  ASSESSMENT / PLAN  1. Uncontrolled type 1 diabetes mellitus with hyperglycemia, with long-term current use of insulin (HCC)   2. H/O Graves' disease      Diabetes Mellitus type 1, no known complications. - Diabetic status / severity: Uncontrolled  Lab Results  Component Value Date   HGBA1C 9.0 (A) 01/02/2023    - Hemoglobin A1c goal : <7%  - Medications: Adjusted as follows.  I) continue Toujeo 25 units daily. II) adjust NovoLog 5 - 8 units with meals 3 times a day.  Take 5 units for a small meal and 8 units for larger meal.  -Stop metformin.  - Home glucose testing: With meals 3 times a day and at bedtime.  Prescription for freestyle libre 3 + sent. - Discussed/ Gave Hypoglycemia treatment plan.  # Consult : not required at this time.   # Annual urine for microalbuminuria/ creatinine ratio, no microalbuminuria currently. Last  Lab Results  Component Value Date   MICRALBCREAT 1.4 09/05/2022    # Foot check nightly.  # Annual dilated diabetic eye exams.   -  Diet: Make healthy diabetic food choices - Life style / activity / exercise: Discussed.  2. Blood pressure  -  BP Readings from Last 1 Encounters:  01/02/23 108/60    - Control is in target.  - No change in current plans.  3. Lipid status / Hyperlipidemia - Last  Lab Results  Component Value Date   LDLCALC 129 (H) 09/03/2022    - Continue atorvastatin 40 mg daily.  Recheck lipid panel in next follow-up visit.  # Patient has history of Graves' disease on remission.  Will check thyroid function test in next set of lab.  Diagnoses and all orders for this visit:  Uncontrolled type 1 diabetes mellitus with hyperglycemia, with long-term current use of insulin (HCC) -     POCT glycosylated hemoglobin (Hb A1C) -     Lipid panel -     Hemoglobin A1c -     Basic metabolic panel  H/O Graves' disease -     T3, free -     T4, free -     TSH  Other orders -     NOVOLOG FLEXPEN 100 UNIT/ML FlexPen; Take 5-8 units with meals three times a day, maximum 30 units/day. -     Continuous Glucose Sensor (FREESTYLE LIBRE 3 PLUS SENSOR) MISC; 1 each by Does not apply route continuous. Change every 15 days.   DISPOSITION Follow up in clinic in 3 months suggested.   All questions answered and patient verbalized understanding of the plan.  Iraq Kirkland Figg, MD [facility] Endocrinology [facility] [address] [place], [address] [place], [place] [address] Phone # [phone]  At least part of this note was generated using voice recognition software. Inadvertent word errors may have occurred, which were not recognized during the proofreading process.

## 2023-03-28 ENCOUNTER — Other Ambulatory Visit: Payer: BC Managed Care – PPO

## 2023-04-02 ENCOUNTER — Ambulatory Visit: Payer: BC Managed Care – PPO | Admitting: Endocrinology

## 2023-05-02 ENCOUNTER — Other Ambulatory Visit

## 2023-05-02 DIAGNOSIS — E1065 Type 1 diabetes mellitus with hyperglycemia: Secondary | ICD-10-CM | POA: Diagnosis not present

## 2023-05-02 DIAGNOSIS — Z8639 Personal history of other endocrine, nutritional and metabolic disease: Secondary | ICD-10-CM | POA: Diagnosis not present

## 2023-05-05 ENCOUNTER — Encounter: Payer: Self-pay | Admitting: Endocrinology

## 2023-05-05 LAB — BASIC METABOLIC PANEL WITH GFR
BUN/Creatinine Ratio: 8 (calc) (ref 6–22)
BUN: 8 mg/dL (ref 7–25)
CO2: 19 mmol/L — ABNORMAL LOW (ref 20–32)
Calcium: 9.4 mg/dL (ref 8.6–10.2)
Chloride: 97 mmol/L — ABNORMAL LOW (ref 98–110)
Creat: 1.05 mg/dL — ABNORMAL HIGH (ref 0.50–0.99)
Glucose, Bld: 514 mg/dL (ref 65–99)
Potassium: 4 mmol/L (ref 3.5–5.3)
Sodium: 130 mmol/L — ABNORMAL LOW (ref 135–146)
eGFR: 66 mL/min/{1.73_m2} (ref >=60)

## 2023-05-05 LAB — LIPID PANEL
Cholesterol: 228 mg/dL — ABNORMAL HIGH (ref <200)
HDL: 69 mg/dL (ref >=50)
LDL Cholesterol (Calc): 141 mg/dL — ABNORMAL HIGH
Non-HDL Cholesterol (Calc): 159 mg/dL — ABNORMAL HIGH (ref <130)
Total CHOL/HDL Ratio: 3.3 (calc) (ref <5.0)
Triglycerides: 85 mg/dL (ref <150)

## 2023-05-05 LAB — HEMOGLOBIN A1C
Hgb A1c MFr Bld: 10.2 %{Hb} — ABNORMAL HIGH (ref <5.7)
Mean Plasma Glucose: 246 mg/dL
eAG (mmol/L): 13.6 mmol/L

## 2023-05-05 LAB — TSH: TSH: 2.37 m[IU]/L

## 2023-05-05 LAB — T4, FREE: Free T4: 1 ng/dL (ref 0.8–1.8)

## 2023-05-05 LAB — T3, FREE: T3, Free: 2.2 pg/mL — ABNORMAL LOW (ref 2.3–4.2)

## 2023-05-06 ENCOUNTER — Encounter: Payer: Self-pay | Admitting: Endocrinology

## 2023-05-06 ENCOUNTER — Ambulatory Visit: Admitting: Endocrinology

## 2023-05-06 VITALS — BP 146/60 | HR 115 | Ht 63.0 in | Wt 157.0 lb

## 2023-05-06 DIAGNOSIS — Z8639 Personal history of other endocrine, nutritional and metabolic disease: Secondary | ICD-10-CM | POA: Diagnosis not present

## 2023-05-06 DIAGNOSIS — E785 Hyperlipidemia, unspecified: Secondary | ICD-10-CM

## 2023-05-06 DIAGNOSIS — E1069 Type 1 diabetes mellitus with other specified complication: Secondary | ICD-10-CM

## 2023-05-06 DIAGNOSIS — E1169 Type 2 diabetes mellitus with other specified complication: Secondary | ICD-10-CM

## 2023-05-06 DIAGNOSIS — E1065 Type 1 diabetes mellitus with hyperglycemia: Secondary | ICD-10-CM | POA: Diagnosis not present

## 2023-05-06 LAB — POCT CBG (FASTING - GLUCOSE)-MANUAL ENTRY: Glucose Fasting, POC: 417 mg/dL — AB (ref 70–99)

## 2023-05-06 MED ORDER — ATORVASTATIN CALCIUM 80 MG PO TABS: 80.0000 mg | ORAL_TABLET | Freq: Every day | ORAL | 3 refills | Status: AC

## 2023-05-06 MED ORDER — DEXCOM G7 SENSOR MISC: 1.0000 | 0 refills | Status: DC

## 2023-05-06 MED ORDER — LANCETS MISC. MISC: 1.0000 | Freq: Three times a day (TID) | 3 refills | Status: AC

## 2023-05-06 MED ORDER — BLOOD GLUCOSE TEST VI STRP: 1.0000 | ORAL_STRIP | Freq: Three times a day (TID) | 3 refills | Status: AC

## 2023-05-06 MED ORDER — TOUJEO SOLOSTAR 300 UNIT/ML ~~LOC~~ SOPN: 25.0000 [IU] | PEN_INJECTOR | Freq: Every day | SUBCUTANEOUS | 11 refills | Status: DC

## 2023-05-06 MED ORDER — LANCET DEVICE MISC: 1.0000 | Freq: Three times a day (TID) | 0 refills | Status: AC

## 2023-05-06 MED ORDER — LANTUS SOLOSTAR 100 UNIT/ML ~~LOC~~ SOPN: 25.0000 [IU] | PEN_INJECTOR | Freq: Every day | SUBCUTANEOUS | 4 refills | Status: DC

## 2023-05-06 MED ORDER — INSULIN PEN NEEDLE 32G X 4 MM MISC: 3 refills | Status: DC

## 2023-05-06 MED ORDER — NOVOLOG FLEXPEN 100 UNIT/ML ~~LOC~~ SOPN: PEN_INJECTOR | SUBCUTANEOUS | 11 refills | Status: DC

## 2023-05-06 MED ORDER — BLOOD GLUCOSE MONITORING SUPPL DEVI
1.0000 | Freq: Three times a day (TID) | 0 refills | Status: AC
Start: 2023-05-06 — End: ?

## 2023-05-06 NOTE — Progress Notes (Signed)
 Outpatient Endocrinology Note [name], MD  05/06/23  Patient's Name: Samantha Willis    DOB: 11-24-1975    MRN: [id number]                                                    REASON OF VISIT: Follow up  for type 1 diabetes mellitus  PCP: Shaelin Lalley, [place], MD  HISTORY OF PRESENT ILLNESS:   Samantha Willis is a 48 y.o. old female with past medical history listed below, is here for follow up of type 1 diabetes mellitus.    Pertinent Diabetes History: She was diagnosed with type 1 diabetes mellitus in 2021.  She was initially presented with diabetes ketoacidosis. Hemoglobin A1c was 14.7% at the time of diagnosis.  She has remained uncontrolled diabetes with hemoglobin A1c in the range of 7.3 to 14.7%, mostly in the range of 8 to 11%.  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  Previous diabetes education: Yes   Chronic Diabetes Complications : Retinopathy: no. Last ophthalmology exam was done on annually, reportedly. Nephropathy: no Peripheral neuropathy: no Coronary artery disease: no Stroke: no  Relevant comorbidities and cardiovascular risk factors: Obesity: no Body mass index is 27.81 kg/m.  Hypertension: yes Hyperlipidemia. yes  Current / Home Diabetic regimen includes: - Toujeo 25 units daily.Not currently taking.  - NovoLog 10 units with meals 2 times a day.  Prior diabetic medications: Trulicity, stopped in 2023.  Had taken metformin in the past.  Glycemic data:   Did not bring glucometer to review.    Hypoglycemia: Patient has unknown hypoglycemic episodes. Patient has hypoglycemia awareness.  Factors modifying glucose control: 1.  Diabetic diet assessment: Mostly 2 - 3 meals a day.  2.  Staying active or exercising: No formal exercise.  3.  Medication compliance:  compliant some of the time.  Interval history  Patient reports she did not have medical insurance, husband lost.  And was not taking basal insulin from February, she was only taking NovoLog 10 units with meals about 2 times a day.  Denies hospitalization due to diabetes problem or DKA.  Discussed that is important to take insulin every day including basal insulin, not taking insulin is a risk for developing diabetes ketoacidosis and which can be even life-threatening.  She reports she now has medical insurance and would be able to take insulin as prescribed.  Recent hemoglobin A1c was sent to 10.2%.  On the recent lab work she had blood sugar of 541.  She denies nausea, vomiting or abdominal pain.  No other complaints today.  Recent lab results reviewed including normal thyroid function test as follows.  Cholesterol level high LDL.  Electrolytes with high glucose and pseudohyponatremia.    Latest Reference Range & Units 05/02/23 09:08  TSH mIU/L 2.37  Triiodothyronine,Free,Serum 2.3 - 4.2 pg/mL 2.2 (L)  T4,Free(Direct) 0.8 - 1.8 ng/dL 1.0  (L): Data is abnormally low REVIEW OF SYSTEMS As per history of present illness.   PAST MEDICAL HISTORY: Past Medical History:  Diagnosis Date   Anxiety    Carpal tunnel syndrome 05/06/2014    Bilateral   Diabetic ketoacidosis (HCC) 11/17/2019   GERD (gastroesophageal  reflux disease)    Graves disease    Graves Disease   Heart palpitations    Obesity     PAST SURGICAL HISTORY: Past Surgical History:  Procedure Laterality Date   Biopsy of Right Breast     At [facility]   CARPAL TUNNEL RELEASE Right 11/21/2015   Procedure: CARPAL TUNNEL RELEASE right;  Surgeon: Cindee Salt, MD;  Location: [facility];  Service: Orthopedics;  Laterality: Right;  FAB   CARPAL TUNNEL RELEASE Left 06/11/2016   Procedure: LEFT CARPAL TUNNEL RELEASE;  Surgeon: Cindee Salt, MD;  Location: [facility];  Service: Orthopedics;   Laterality: Left;  REG/FAB   CESAREAN SECTION     IUD REMOVAL     TUBAL LIGATION  2011   WISDOM TOOTH EXTRACTION      ALLERGIES: No Known Allergies  FAMILY HISTORY:  Family History  Problem Relation Age of Onset   Heart attack Mother    Cancer Mother 55       breast   Diabetes Mother        II   Hypertension Father    Hyperlipidemia Father    Gout Father    Healthy Sister    Healthy Brother    Healthy Brother     SOCIAL HISTORY: Social History   Socioeconomic History   Marital status: Married    Spouse name: Not on file   Number of children: 2   Years of education: some col.   Highest education level: Not on file  Occupational History   Occupation: unemployed  Tobacco Use   Smoking status: Former    Current packs/day: 0.00    Types: Cigarettes    Quit date: 11/10/2019    Years since quitting: 3.4   Smokeless tobacco: Never  Vaping Use   Vaping status: Never Used  Substance and Sexual Activity   Alcohol use: Not Currently    Comment: Occassionally   Drug use: No   Sexual activity: Yes    Partners: Male    Birth control/protection: Surgical    Comment: btl  Other Topics Concern   Not on file  Social History Narrative   Lives at home w/ her fiance, son, brother and dad   Patient drinks 1 soda per week.   Patient is right handed   Social Drivers of Corporate investment banker Strain: Not on file  Food Insecurity: Not on file  Transportation Needs: Not on file  Physical Activity: Not on file  Stress: Not on file  Social Connections: Not on file    MEDICATIONS:  Current Outpatient Medications  Medication Sig Dispense Refill   BD PEN NEEDLE NANO 2ND GEN 32G X 4 MM MISC USE DAILY FOR GLUCOSE CONTROL 100 each 3   blood glucose meter kit and supplies Dispense based on patient and insurance preference. Use up to four times daily as directed. (FOR ICD-10 E10.9, E11.9). 1 each 0   Blood Glucose Monitoring Suppl DEVI 1 each by Does not apply route in the  morning, at noon, and at bedtime. May substitute to any manufacturer covered by patient's insurance. 1 each 0   Continuous Glucose Sensor (DEXCOM G7 SENSOR) MISC 1 Device by Does not apply route continuous. 9 each 0   Glucose Blood (BLOOD GLUCOSE TEST STRIPS) STRP 1 each by In Vitro route in the morning, at noon, and at bedtime. May substitute to any manufacturer covered by patient's insurance. 100 each 3   insulin glargine (LANTUS  SOLOSTAR) 100 UNIT/ML Solostar Pen Inject 25 Units into the skin daily. 15 mL 4   Insulin Syringe-Needle U-100 27G X 1/2" 1 ML MISC Use daily for glucose control.  DxE11.9 100 each 3   Lancet Device MISC 1 each by Does not apply route in the morning, at noon, and at bedtime. May substitute to any manufacturer covered by patient's insurance. 1 each 0   Lancets Misc. MISC 1 each by Does not apply route in the morning, at noon, and at bedtime. May substitute to any manufacturer covered by patient's insurance. 100 each 3   sertraline (ZOLOFT) 100 MG tablet Take 1 tablet (100 mg total) by mouth daily. APPT NEEDED FOR FUTURE REFILLS 30 tablet 0   atorvastatin (LIPITOR) 80 MG tablet Take 1 tablet (80 mg total) by mouth daily. 90 tablet 3   Continuous Blood Gluc Receiver (FREESTYLE LIBRE 14 DAY READER) DEVI 1 each by Does not apply route daily. (Patient not taking: Reported on 05/06/2023) 1 each 2   Continuous Glucose Sensor (FREESTYLE LIBRE 3 PLUS SENSOR) MISC 1 EACH BY DOES NOT APPLY ROUTE CONTINUOUS. CHANGE EVERY 15 DAYS. (Patient not taking: Reported on 05/06/2023) 6 each 4   insulin glargine, 1 Unit Dial, (TOUJEO SOLOSTAR) 300 UNIT/ML Solostar Pen Inject 25 Units into the skin daily. 15 mL 11   Insulin Pen Needle 32G X 4 MM MISC Use 4x a day 300 each 3   NOVOLOG FLEXPEN 100 UNIT/ML FlexPen Take 5-8 units with meals three times a day, maximum 30 units/day. 15 mL 11   No current facility-administered medications for this visit.    PHYSICAL EXAM: Vitals:   05/06/23 1112  BP:  (!) 146/60  Pulse: (!) 115  SpO2: 98%  Weight: 157 lb (71.2 kg)  Height: 5\' 3"  (1.6 m)     Body mass index is 27.81 kg/m.  Wt Readings from Last 3 Encounters:  05/06/23 157 lb (71.2 kg)  01/02/23 175 lb 12.8 oz (79.7 kg)  09/05/22 164 lb 9.6 oz (74.7 kg)    General: Well developed, well nourished female in no apparent distress.  HEENT: AT/Isabella, no external lesions.  Eyes: Conjunctiva clear and no icterus. Neck: Neck supple  Lungs: Respirations not labored Neurologic: Alert, oriented, normal speech Extremities / Skin: Dry.  Psychiatric: Does not appear depressed or anxious  Diabetic Foot Exam - Simple   No data filed    LABS Reviewed Lab Results  Component Value Date   HGBA1C 10.2 (H) 05/02/2023   HGBA1C 9.0 (A) 01/02/2023   HGBA1C 10.8 (H) 09/03/2022   Lab Results  Component Value Date   FRUCTOSAMINE 459 (H) 02/14/2022   Lab Results  Component Value Date   CHOL 228 (H) 05/02/2023   HDL 69 05/02/2023   LDLCALC 141 (H) 05/02/2023   LDLDIRECT 64.0 02/14/2022   TRIG 85 05/02/2023   CHOLHDL 3.3 05/02/2023   Lab Results  Component Value Date   MICRALBCREAT 1.4 09/05/2022   MICRALBCREAT 0.5 07/11/2021   Lab Results  Component Value Date   CREATININE 1.05 (H) 05/02/2023   Lab Results  Component Value Date   GFR 84.57 09/03/2022    Latest Reference Range & Units 09/05/22 15:48  Glucose 70 - 99 mg/dL 161 (H)  IA-2 Antibody <5.4 U/mL <5.4  ZNT8 Antibodies <15 U/mL 47 (H)  Glutamic Acid Decarb Ab <5 IU/mL >250 (H)  C-Peptide 0.80 - 3.85 ng/mL 0.41 (L)  (H): Data is abnormally high (L): Data is abnormally low  ASSESSMENT /  PLAN  1. Uncontrolled type 1 diabetes mellitus with hyperglycemia, with long-term current use of insulin (HCC)   2. Type 1 diabetes mellitus with hyperglycemia (HCC)   3. Type 1 diabetes mellitus with other specified complication (HCC)   4. Hyperlipidemia associated with type 2 diabetes mellitus (HCC)   5. H/O Graves' disease      Diabetes Mellitus type 1, no known complications. - Diabetic status / severity: Uncontrolled  Lab Results  Component Value Date   HGBA1C 10.2 (H) 05/02/2023    - Hemoglobin A1c goal : <6.5%  Patient was not taking basal insulin Toujeo since February.  She now has medical insurance and would be able to take insulin therapy as prescribed.  Discussed in detail about compliance with insulin therapy every day.  Not taking insulin can lead into diabetic ketoacidosis and which can even be life-threatening.  Resume insulin therapy as follows.  - Medications: Adjusted as follows.  I) continue Toujeo 25 units daily. II) adjust NovoLog 5 - 8 units with meals 3 times a day.  Take 5 units for a small meal and 8 units for larger meal.  -Stop metformin.  - Home glucose testing: With meals 3 times a day and at bedtime.  Dexcom G7 prescribed.  Patient reports her freestyle Josephine Igo was not covered last time.  Asked to bring the glucometer in the clinic visit.  Sent glucometer and test supplies prescription.   - Discussed/ Gave Hypoglycemia treatment plan.  # Consult : not required at this time.   # Annual urine for microalbuminuria/ creatinine ratio, no microalbuminuria currently. Last  Lab Results  Component Value Date   MICRALBCREAT 1.4 09/05/2022    # Foot check nightly.  # Annual dilated diabetic eye exams.   - Diet: Make healthy diabetic food choices - Life style / activity / exercise: Discussed.  2. Blood pressure  -  BP Readings from Last 1 Encounters:  05/06/23 (!) 146/60    - Control is in target.  - No change in current plans.  3. Lipid status / Hyperlipidemia - Last  Lab Results  Component Value Date   LDLCALC 141 (H) 05/02/2023   - Continue atorvastatin 80 mg daily.  Not fully compliant LDL still high.  Discussed about compliance with taking medication.  # Patient has history of Graves' disease on remission.  Normal thyroid function test.  Rockell was seen  today for diabetes.  Diagnoses and all orders for this visit:  Uncontrolled type 1 diabetes mellitus with hyperglycemia, with long-term current use of insulin (HCC) -     POCT CBG (Fasting - Glucose) -     insulin glargine (LANTUS SOLOSTAR) 100 UNIT/ML Solostar Pen; Inject 25 Units into the skin daily. -     insulin glargine, 1 Unit Dial, (TOUJEO SOLOSTAR) 300 UNIT/ML Solostar Pen; Inject 25 Units into the skin daily. -     NOVOLOG FLEXPEN 100 UNIT/ML FlexPen; Take 5-8 units with meals three times a day, maximum 30 units/day. -     Insulin Pen Needle 32G X 4 MM MISC; Use 4x a day -     Blood Glucose Monitoring Suppl DEVI; 1 each by Does not apply route in the morning, at noon, and at bedtime. May substitute to any manufacturer covered by patient's insurance. -     Glucose Blood (BLOOD GLUCOSE TEST STRIPS) STRP; 1 each by In Vitro route in the morning, at noon, and at bedtime. May substitute to any manufacturer covered by patient's insurance. -  Lancet Device MISC; 1 each by Does not apply route in the morning, at noon, and at bedtime. May substitute to any manufacturer covered by patient's insurance. -     Lancets Misc. MISC; 1 each by Does not apply route in the morning, at noon, and at bedtime. May substitute to any manufacturer covered by patient's insurance. -     Continuous Glucose Sensor (DEXCOM G7 SENSOR) MISC; 1 Device by Does not apply route continuous.  Type 1 diabetes mellitus with hyperglycemia (HCC) -     POCT CBG (Fasting - Glucose)  Type 1 diabetes mellitus with other specified complication (HCC)  Hyperlipidemia associated with type 2 diabetes mellitus (HCC) -     atorvastatin (LIPITOR) 80 MG tablet; Take 1 tablet (80 mg total) by mouth daily.  H/O Graves' disease    DISPOSITION Follow up in clinic in 6 weeks suggested.   All questions answered and patient verbalized understanding of the plan.  [place] Anel Creighton, MD [facility] [address] [place], [address] [place], [place] [address] Phone # [phone]  At least part of this note was generated using voice recognition software. Inadvertent word errors may have occurred, which were not recognized during the proofreading process.

## 2023-06-30 ENCOUNTER — Ambulatory Visit: Admitting: Endocrinology

## 2023-07-24 ENCOUNTER — Encounter: Payer: Self-pay | Admitting: Endocrinology

## 2023-07-24 ENCOUNTER — Ambulatory Visit: Payer: Self-pay | Admitting: Endocrinology

## 2023-07-24 ENCOUNTER — Ambulatory Visit: Admitting: Endocrinology

## 2023-07-24 VITALS — BP 108/80 | HR 82 | Resp 20 | Ht 63.0 in | Wt 168.0 lb

## 2023-07-24 DIAGNOSIS — Z794 Long term (current) use of insulin: Secondary | ICD-10-CM | POA: Diagnosis not present

## 2023-07-24 DIAGNOSIS — E782 Mixed hyperlipidemia: Secondary | ICD-10-CM | POA: Diagnosis not present

## 2023-07-24 DIAGNOSIS — E1065 Type 1 diabetes mellitus with hyperglycemia: Secondary | ICD-10-CM

## 2023-07-24 DIAGNOSIS — Z8639 Personal history of other endocrine, nutritional and metabolic disease: Secondary | ICD-10-CM

## 2023-07-24 LAB — POCT GLYCOSYLATED HEMOGLOBIN (HGB A1C): Hemoglobin A1C: 9.1 % — AB (ref 4.0–5.6)

## 2023-07-24 MED ORDER — TOUJEO SOLOSTAR 300 UNIT/ML ~~LOC~~ SOPN: 28.0000 [IU] | PEN_INJECTOR | Freq: Every day | SUBCUTANEOUS | 11 refills | Status: DC

## 2023-07-24 MED ORDER — LANTUS SOLOSTAR 100 UNIT/ML ~~LOC~~ SOPN: 28.0000 [IU] | PEN_INJECTOR | Freq: Every day | SUBCUTANEOUS | 4 refills | Status: DC

## 2023-07-24 MED ORDER — FREESTYLE LIBRE 3 PLUS SENSOR MISC: 1.0000 | 4 refills | Status: DC

## 2023-07-24 MED ORDER — NOVOLOG FLEXPEN 100 UNIT/ML ~~LOC~~ SOPN: PEN_INJECTOR | SUBCUTANEOUS | 11 refills | Status: DC

## 2023-07-24 NOTE — Progress Notes (Signed)
 Outpatient Endocrinology Note [place] Maleta Pacha, MD  07/24/23  Patient's Name: Samantha Willis    DOB: July 11, 1975    MRN: [id number]                                                    REASON OF VISIT: Follow up  for type 1 diabetes mellitus  PCP: Shontez Sermon, [place], MD  HISTORY OF PRESENT ILLNESS:   Samantha Willis is a 48 y.o. old female with past medical history listed below, is here for follow up of type 1 diabetes mellitus.    Pertinent Diabetes History: She was diagnosed with type 1 diabetes mellitus in 2021.  She was initially presented with diabetes ketoacidosis. Hemoglobin A1c was 14.7% at the time of diagnosis.  She has remained uncontrolled diabetes with hemoglobin A1c in the range of 7.3 to 14.7%, mostly in the range of 8 to 11%.  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  Previous diabetes education: Yes   Chronic Diabetes Complications : Retinopathy: no. Last ophthalmology exam was done on annually, reportedly. Nephropathy: no Peripheral neuropathy: no Coronary artery disease: no Stroke: no  Relevant comorbidities and cardiovascular risk factors: Obesity: no Body mass index is 29.76 kg/m.  Hypertension: yes Hyperlipidemia. yes  Current / Home Diabetic regimen includes: - Toujeo  25 units daily. - NovoLog  6-10 units with meals 2 times a day.  Prior diabetic medications: Trulicity , stopped in 2023.  Had taken metformin  in the past.  Glycemic data:   Did not bring glucometer to review.  This afternoon 280.  Hypoglycemia: Patient has unknown hypoglycemic episodes. Patient has hypoglycemia awareness.  Factors modifying glucose control: 1.  Diabetic diet assessment: Mostly 2 - 3 meals a day.  2.  Staying active or exercising: No formal exercise.  3.  Medication compliance:  compliant some of the time.  Interval history  Diabetes regimen is reviewed and noted above.  Reports compliance with taking insulin .  She has complaints of right foot pain on the lateral side and on the sole.  She had to use high arch / wedges shoes but otherwise no trauma.  Complains of great toe tingling.  Normal excellent callus.  No hospitalization since last visit related to diabetes.  No other complaints today.  No hypo and hyperthyroid symptoms.-Send test was acceptable in April 2025.  REVIEW OF SYSTEMS As per history of present illness.   PAST MEDICAL HISTORY: Past Medical History:  Diagnosis Date   Anxiety    Carpal tunnel syndrome 05/06/2014    Bilateral   Diabetic ketoacidosis (HCC) 11/17/2019   GERD (gastroesophageal reflux disease)    Graves disease    Graves Disease   Heart palpitations    Obesity     PAST SURGICAL HISTORY: Past Surgical History:  Procedure Laterality Date   Biopsy of Right Breast     At [facility]   CARPAL TUNNEL RELEASE Right 11/21/2015   Procedure: CARPAL TUNNEL RELEASE right;  Surgeon: Arley Curia, MD;  Location: [facility];  Service: Orthopedics;  Laterality: Right;  FAB   CARPAL TUNNEL RELEASE Left 06/11/2016   Procedure: LEFT CARPAL TUNNEL RELEASE;  Surgeon: Curia Arley, MD;  Location: [facility];  Service: Orthopedics;  Laterality: Left;  REG/FAB   CESAREAN SECTION     IUD REMOVAL     TUBAL LIGATION  2011   WISDOM TOOTH EXTRACTION      ALLERGIES: No Known Allergies  FAMILY HISTORY:  Family History  Problem Relation Age of Onset   Heart attack Mother    Cancer Mother 57       breast   Diabetes Mother        II   Hypertension Father    Hyperlipidemia Father    Gout Father    Healthy Sister    Healthy Brother    Healthy Brother     SOCIAL HISTORY: Social History   Socioeconomic History   Marital status: Married    Spouse name: Not on file   Number of children: 2   Years of  education: some col.   Highest education level: Not on file  Occupational History   Occupation: unemployed  Tobacco Use   Smoking status: Former    Current packs/day: 0.00    Types: Cigarettes    Quit date: 11/10/2019    Years since quitting: 3.7   Smokeless tobacco: Never  Vaping Use   Vaping status: Never Used  Substance and Sexual Activity   Alcohol use: Not Currently    Comment: Occassionally   Drug use: No   Sexual activity: Yes    Partners: Male    Birth control/protection: Surgical    Comment: btl  Other Topics Concern   Not on file  Social History Narrative   Lives at home w/ her fiance, son, brother and dad   Patient drinks 1 soda per week.   Patient is right handed   Social Drivers of Corporate investment banker Strain: Not on file  Food Insecurity: Not on file  Transportation Needs: Not on file  Physical Activity: Not on file  Stress: Not on file  Social Connections: Not on file    MEDICATIONS:  Current Outpatient Medications  Medication Sig Dispense Refill   atorvastatin  (LIPITOR) 80 MG tablet Take 1 tablet (80 mg total) by mouth daily. 90 tablet 3   BD PEN NEEDLE NANO 2ND GEN 32G X 4 MM MISC USE DAILY FOR GLUCOSE CONTROL 100 each 3   blood glucose meter kit and supplies Dispense based on patient and insurance preference. Use up to four times daily as directed. (FOR ICD-10 E10.9, E11.9). 1 each 0   Blood Glucose Monitoring Suppl DEVI 1 each by Does not apply route in the morning, at noon, and at bedtime. May substitute to any manufacturer covered by patient's insurance. 1 each 0   Glucose Blood (BLOOD GLUCOSE TEST STRIPS) STRP 1 each by In Vitro route in the morning, at noon, and at bedtime. May substitute to any manufacturer covered by patient's insurance. 100 each 3   Insulin  Pen Needle 32G X 4 MM MISC Use 4x a day 300 each 3   Insulin  Syringe-Needle U-100 27G X 1/2 1 ML MISC Use daily for glucose control.  DxE11.9 100 each 3   sertraline  (ZOLOFT ) 100 MG  tablet Take 1 tablet (100 mg total) by mouth daily. APPT NEEDED FOR FUTURE REFILLS 30 tablet 0   Continuous Blood Gluc Receiver (FREESTYLE LIBRE 14 DAY READER) DEVI 1 each by Does not apply route daily. (Patient not taking: Reported on 07/24/2023) 1 each 2   Continuous Glucose Sensor (FREESTYLE LIBRE 3 PLUS SENSOR) MISC 1 each by Does not  apply route continuous. Change every 15 days. 6 each 4   insulin  glargine (LANTUS  SOLOSTAR) 100 UNIT/ML Solostar Pen Inject 28 Units into the skin daily. 15 mL 4   NOVOLOG  FLEXPEN 100 UNIT/ML FlexPen Take 5-10 units with meals three times a day, maximum 30 units/day. 15 mL 11   No current facility-administered medications for this visit.    PHYSICAL EXAM: Vitals:   07/24/23 1504  BP: 108/80  Pulse: 82  Resp: 20  SpO2: 97%  Weight: 168 lb (76.2 kg)  Height: 5' 3 (1.6 m)      Body mass index is 29.76 kg/m.  Wt Readings from Last 3 Encounters:  07/24/23 168 lb (76.2 kg)  05/06/23 157 lb (71.2 kg)  01/02/23 175 lb 12.8 oz (79.7 kg)    General: Well developed, well nourished female in no apparent distress.  HEENT: AT/Kay, no external lesions.  Eyes: Conjunctiva clear and no icterus. Neck: Neck supple  Lungs: Respirations not labored Neurologic: Alert, oriented, normal speech Extremities / Skin: Dry.  Psychiatric: Does not appear depressed or anxious  Diabetic Foot Exam - Simple   Simple Foot Form Diabetic Foot exam was performed with the following findings: Yes 07/24/2023  3:13 PM  Visual Inspection No deformities, no ulcerations, no other skin breakdown bilaterally: Yes Sensation Testing Intact to touch and monofilament testing bilaterally: Yes Pulse Check Posterior Tibialis and Dorsalis pulse intact bilaterally: Yes Comments    LABS Reviewed Lab Results  Component Value Date   HGBA1C 9.1 (A) 07/24/2023   HGBA1C 10.2 (H) 05/02/2023   HGBA1C 9.0 (A) 01/02/2023   Lab Results  Component Value Date   FRUCTOSAMINE 459 (H) 02/14/2022    Lab Results  Component Value Date   CHOL 228 (H) 05/02/2023   HDL 69 05/02/2023   LDLCALC 141 (H) 05/02/2023   LDLDIRECT 64.0 02/14/2022   TRIG 85 05/02/2023   CHOLHDL 3.3 05/02/2023   Lab Results  Component Value Date   MICRALBCREAT 1.4 09/05/2022   MICRALBCREAT 0.5 07/11/2021   Lab Results  Component Value Date   CREATININE 1.05 (H) 05/02/2023   Lab Results  Component Value Date   GFR 84.57 09/03/2022    Latest Reference Range & Units 09/05/22 15:48  Glucose 70 - 99 mg/dL 704 (H)  IA-2 Antibody <5.4 U/mL <5.4  ZNT8 Antibodies <15 U/mL 47 (H)  Glutamic Acid Decarb Ab <5 IU/mL >250 (H)  C-Peptide 0.80 - 3.85 ng/mL 0.41 (L)  (H): Data is abnormally high (L): Data is abnormally low  ASSESSMENT / PLAN  1. Uncontrolled type 1 diabetes mellitus with hyperglycemia, with long-term current use of insulin  (HCC)   2. H/O Graves' disease   3. Mixed hyperlipidemia      Diabetes Mellitus type 1, no known complications. - Diabetic status / severity: Uncontrolled improving.  Lab Results  Component Value Date   HGBA1C 9.1 (A) 07/24/2023    - Hemoglobin A1c goal : <6.5%  Hemoglobin A1c mildly improved.  No glucose data to review.  She reports has not been checking blood sugar in the morning fasting.  She reports blood sugar in the afternoon mostly high and this afternoon was 280.  Adjusted diabetes regimen as follows. - Medications: Adjusted as follows.  I) continue Toujeo  25 units daily.  I) Increase Lantus  to 28 units daily. II) Take NovoLog  5 to 10 units with meals up to 3 times a day.  Take before eating.  She is currently taking NovoLog  6 to 8 units.   -  Home glucose testing: With meals 3 times a day and at bedtime.  Sent prescription for freestyle libre 3+.  Encouraged to try CGM.  Advised to bring glucometer and CGM in follow-up visit.  - Discussed/ Gave Hypoglycemia treatment plan.  # Consult : not required at this time.   # Annual urine for  microalbuminuria/ creatinine ratio, no microalbuminuria currently. Last  Lab Results  Component Value Date   MICRALBCREAT 1.4 09/05/2022    # Foot check nightly.  # Annual dilated diabetic eye exams.   - Diet: Make healthy diabetic food choices - Life style / activity / exercise: Discussed.  2. Blood pressure  -  BP Readings from Last 1 Encounters:  07/24/23 108/80    - Control is in target.  - No change in current plans.  3. Lipid status / Hyperlipidemia - Last  Lab Results  Component Value Date   LDLCALC 141 (H) 05/02/2023   - Continue atorvastatin  80 mg daily.  Not fully compliant LDL still high.  Discussed about compliance with taking medication.  Reports not fully compliant.  Advised to take every day.  # Patient has history of Graves' disease on remission.  Normal thyroid  function test in April 2025.  Diagnoses and all orders for this visit:  Uncontrolled type 1 diabetes mellitus with hyperglycemia, with long-term current use of insulin  (HCC) -     POCT glycosylated hemoglobin (Hb A1C) -     Ambulatory referral to Podiatry -     Discontinue: insulin  glargine, 1 Unit Dial, (TOUJEO  SOLOSTAR) 300 UNIT/ML Solostar Pen; Inject 28 Units into the skin daily. -     insulin  glargine (LANTUS  SOLOSTAR) 100 UNIT/ML Solostar Pen; Inject 28 Units into the skin daily. -     NOVOLOG  FLEXPEN 100 UNIT/ML FlexPen; Take 5-10 units with meals three times a day, maximum 30 units/day. -     Continuous Glucose Sensor (FREESTYLE LIBRE 3 PLUS SENSOR) MISC; 1 each by Does not apply route continuous. Change every 15 days.  H/O Graves' disease  Mixed hyperlipidemia     DISPOSITION Follow up in clinic in 3 months suggested.   All questions answered and patient verbalized understanding of the plan.  [place] Ehan Freas, MD [facility] End[facility] [employer] [facility] [address] [place], [address] [place], [place] [address] Phone # [phone]  At least part of this note was generated using  voice recognition software. Inadvertent word errors may have occurred, which were not recognized during the proofreading process.

## 2023-07-24 NOTE — Patient Instructions (Addendum)
 Latest Reference Range & Units 01/02/23 16:12 05/02/23 09:08 07/24/23 15:07  Hemoglobin A1C 4.0 - 5.6 % 9.0 ! Pend 10.2 (H) 9.1 !  !: Data is abnormal (H): Data is abnormally high  Increase Lantus  to 28 units daily. Take NovoLog  5 to 10 units with meals up to 3 times a day.  Take before eating.

## 2023-07-31 ENCOUNTER — Telehealth: Payer: Self-pay | Admitting: *Deleted

## 2023-07-31 ENCOUNTER — Ambulatory Visit: Admitting: Podiatry

## 2023-07-31 ENCOUNTER — Ambulatory Visit

## 2023-07-31 ENCOUNTER — Encounter: Payer: Self-pay | Admitting: Podiatry

## 2023-07-31 ENCOUNTER — Ambulatory Visit (INDEPENDENT_AMBULATORY_CARE_PROVIDER_SITE_OTHER): Admitting: Podiatry

## 2023-07-31 DIAGNOSIS — R600 Localized edema: Secondary | ICD-10-CM

## 2023-07-31 DIAGNOSIS — Z91199 Patient's noncompliance with other medical treatment and regimen due to unspecified reason: Secondary | ICD-10-CM

## 2023-07-31 DIAGNOSIS — M7751 Other enthesopathy of right foot: Secondary | ICD-10-CM

## 2023-07-31 DIAGNOSIS — I999 Unspecified disorder of circulatory system: Secondary | ICD-10-CM

## 2023-07-31 DIAGNOSIS — M779 Enthesopathy, unspecified: Secondary | ICD-10-CM | POA: Diagnosis not present

## 2023-07-31 MED ORDER — HYDROCODONE-ACETAMINOPHEN 10-325 MG PO TABS: 1.0000 | ORAL_TABLET | Freq: Three times a day (TID) | ORAL | 0 refills | Status: AC | PRN

## 2023-07-31 NOTE — Progress Notes (Signed)
 Subjective:   Patient ID: Samantha Willis, female   DOB: 48 y.o.   MRN: [id number]   HPI Patient states that she has developed pain in her right foot over the last couple weeks and states that it started in the arch is now more on the dorsum of the foot.  She does not remember an injury and has had no breaks in skin's or other pathology   Review of Systems  All other systems reviewed and are negative.       Objective:  Physical Exam Vitals and nursing note reviewed.  Constitutional:      Appearance: She is well-developed.  Pulmonary:     Effort: Pulmonary effort is normal.  Musculoskeletal:        General: Normal range of motion.  Skin:    General: Skin is warm.  Neurological:     Mental Status: She is alert.     Vascular status indicates no current pulses of the right foot DP PT.  I did not note excessive cold of the foot but there is slight temperature change between the 2 feet and the patient has pain that is more diffuse top and bottom of the foot.  Patient's leg has normal hair growth and I did not note any changes to the digits I did not note any petechia or any ulcerations or what appears to be skin distress     Assessment:  I do think there is a good chance this could be a vascular issue with the right lower leg with patient also having diabetes that is under reasonably good control and could be a factor.  Patient does have a lot of pain and is having trouble walking on the foot currently     Plan:  H&P done and I did go ahead today and I sent a vascular request urgent to have her right leg checked for ABI to make sure there is no blockage.  I do not see clinical signs of blockage but I gave the patient's strict instructions if there should be a worsening of pain or any changes in skin color or any breaks in skin she is to go straight to the emergency room.  I am hoping we will be able to get this done in the next couple days but tomorrow is July 4 which makes it  a little bit more difficult.  If patient needs to she will go to emergency room and if she has not heard from the vascular doctors early next week and she is still doing well she is to call us .  I did place her in an air fracture walker to try to take some of the stress off her foot and I wrote her a prescription for Vicodin to help with discomfort at nighttime  X-rays indicate good structure I did not see any fracture or bony pathology

## 2023-07-31 NOTE — Progress Notes (Signed)
 Cancel 24 hours

## 2023-07-31 NOTE — Telephone Encounter (Signed)
 I attempted to call the patient to inform her that we are going to need xrays of her foot.  I left a message for her asking her to arrive a few minutes early and check in with us .  Then, we'll direct her to the imaging department.

## 2023-08-05 ENCOUNTER — Telehealth: Payer: Self-pay | Admitting: Podiatry

## 2023-08-05 DIAGNOSIS — I999 Unspecified disorder of circulatory system: Secondary | ICD-10-CM

## 2023-08-05 NOTE — Telephone Encounter (Signed)
 Patient called in inform provider that she is experiencing tingling and cold to touch in right foot. And she hasn't heard from vascular doctor.

## 2023-08-06 NOTE — Telephone Encounter (Signed)
 Updated referral to [facility] [address] Informed pt and gave new number and address.

## 2023-08-07 ENCOUNTER — Other Ambulatory Visit (HOSPITAL_COMMUNITY): Payer: Self-pay | Admitting: Vascular Surgery

## 2023-08-07 ENCOUNTER — Telehealth: Payer: Self-pay | Admitting: Podiatry

## 2023-08-07 DIAGNOSIS — M79604 Pain in right leg: Secondary | ICD-10-CM

## 2023-08-07 NOTE — Telephone Encounter (Signed)
 Please confirm that she has appointment with vascular. If not she may need to go to ER if symptoms are getting worse

## 2023-08-07 NOTE — Telephone Encounter (Signed)
 Called to confirm if she has an appointment with vascular and to let her know she can visit ER if issues get worse.Didn't get an answer, left a message

## 2023-08-08 ENCOUNTER — Ambulatory Visit (HOSPITAL_COMMUNITY)
Admission: RE | Admit: 2023-08-08 | Discharge: 2023-08-08 | Disposition: A | Discharge status: Home / Self Care | Source: Ambulatory Visit | Attending: Vascular Surgery

## 2023-08-08 DIAGNOSIS — M79604 Pain in right leg: Secondary | ICD-10-CM | POA: Diagnosis not present

## 2023-08-08 LAB — VAS US ABI WITH/WO TBI
Left ABI: 1.28
Right ABI: 0.75

## 2023-08-11 NOTE — Progress Notes (Unsigned)
 [facility] [place]  ASSESSMENT / PLAN: Samantha Willis is a 48 y.o. 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  Recommend:  Abstinence from all tobacco products. Blood glucose control with goal A1c < 7%. Blood pressure control with goal blood pressure < 130/80 mmHg. Lipid reduction therapy with goal LDL-C < 55 mg/dL. Aspirin 81mg  by mouth daily. Atorvastatin  40-80mg  PO QD (or other high intensity statin therapy). Daily walking to and past the point of discomfort.  Follow-up with me in 3 months to evaluate trial of medical therapy  CHIEF COMPLAINT: Abnormal vascular exam  HISTORY OF PRESENT ILLNESS: Samantha Willis is a 48 y.o. female referred to clinic for abnormal vascular exam found during podiatry evaluation. The patient described paresthesias, plantar foot pain, and difficulty bearing weight after dancing at a party a few weeks ago. Prior, she noted cramping discomfort in her right calf typical of claudication. She does not describe rest pain symptoms. She has no ischemic ulceration of the foot.  She does report some radiating discomfort down her leg which to me sounds typical of spinal stenosis.   Past Medical History:  Diagnosis Date   Anxiety    Carpal tunnel syndrome 05/06/2014    Bilateral   Diabetic ketoacidosis (HCC) 11/17/2019   GERD (gastroesophageal reflux disease)    Graves disease    Graves Disease   Heart palpitations    Obesity     Past Surgical History:  Procedure Laterality Date   Biopsy of Right Breast     At [facility]   CARPAL TUNNEL RELEASE Right 11/21/2015   Procedure: CARPAL TUNNEL RELEASE right;  Surgeon: Arley Curia, MD;  Location: [facility];  Service: Orthopedics;  Laterality: Right;  FAB   CARPAL TUNNEL RELEASE Left 06/11/2016   Procedure: LEFT CARPAL TUNNEL RELEASE;  Surgeon: Curia Arley, MD;  Location:   S[facility]ERY [facility];  Service: Orthopedics;  Laterality: Left;  REG/FAB   CESAREAN SECTION     IUD REMOVAL     TUBAL LIGATION  2011   WISDOM TOOTH EXTRACTION      Family History  Problem Relation Age of Onset   Heart attack Mother    Cancer Mother 53       breast   Diabetes Mother        II   Hypertension Father    Hyperlipidemia Father    Gout Father    Healthy Sister    Healthy Brother    Healthy Brother     Social History   Socioeconomic History   Marital status: Married    Spouse name: Not on file   Number of children: 2   Years of education: some col.   Highest education level: Not on file  Occupational History   Occupation: unemployed  Tobacco Use   Smoking status: Former    Current packs/day: 0.00    Types: Cigarettes    Quit date: 11/10/2019    Years since quitting: 3.7   Smokeless tobacco: Never  Vaping Use   Vaping status: Never Used  Substance and Sexual Activity   Alcohol use: Not Currently    Comment: Occassionally   Drug use: No   Sexual activity: Yes    Partners: Male    Birth control/protection: Surgical    Comment: btl  Other Topics Concern   Not on file  Social History Narrative   Lives at home w/ her fiance, son, brother and dad  Patient drinks 1 soda per week.   Patient is right handed   Social Drivers of Corporate investment banker Strain: Not on file  Food Insecurity: Not on file  Transportation Needs: Not on file  Physical Activity: Not on file  Stress: Not on file  Social Connections: Not on file  Intimate Partner Violence: Not on file    No Known Allergies  Current Outpatient Medications  Medication Sig Dispense Refill   atorvastatin  (LIPITOR) 80 MG tablet Take 1 tablet (80 mg total) by mouth daily. 90 tablet 3   BD PEN NEEDLE NANO 2ND GEN 32G X 4 MM MISC USE DAILY FOR GLUCOSE CONTROL 100 each 3   blood glucose meter kit and supplies Dispense based on patient and insurance preference. Use up to four times daily as directed.  (FOR ICD-10 E10.9, E11.9). 1 each 0   Blood Glucose Monitoring Suppl DEVI 1 each by Does not apply route in the morning, at noon, and at bedtime. May substitute to any manufacturer covered by patient's insurance. 1 each 0   Continuous Blood Gluc Receiver (FREESTYLE LIBRE 14 DAY READER) DEVI 1 each by Does not apply route daily. 1 each 2   Continuous Glucose Sensor (FREESTYLE LIBRE 3 PLUS SENSOR) MISC 1 each by Does not apply route continuous. Change every 15 days. 6 each 4   Glucose Blood (BLOOD GLUCOSE TEST STRIPS) STRP 1 each by In Vitro route in the morning, at noon, and at bedtime. May substitute to any manufacturer covered by patient's insurance. 100 each 3   insulin  glargine (LANTUS  SOLOSTAR) 100 UNIT/ML Solostar Pen Inject 28 Units into the skin daily. 15 mL 4   Insulin  Pen Needle 32G X 4 MM MISC Use 4x a day 300 each 3   Insulin  Syringe-Needle U-100 27G X 1/2 1 ML MISC Use daily for glucose control.  DxE11.9 100 each 3   NOVOLOG  FLEXPEN 100 UNIT/ML FlexPen Take 5-10 units with meals three times a day, maximum 30 units/day. 15 mL 11   sertraline  (ZOLOFT ) 100 MG tablet Take 1 tablet (100 mg total) by mouth daily. APPT NEEDED FOR FUTURE REFILLS 30 tablet 0   No current facility-administered medications for this visit.    PHYSICAL EXAM There were no vitals filed for this visit.  Middle aged woman in no acute distress Regular rate and rhythm Unlabored breathing No palpable pedal pulses in the right foot Palpable left PT pulse   PERTINENT LABORATORY AND RADIOLOGIC DATA  Most recent CBC    Latest Ref Rng & Units 06/29/2021    6:06 AM 06/27/2021    9:47 AM 06/27/2021    6:53 AM  CBC  WBC 4.0 - 10.5 K/uL 12.0   14.6   Hemoglobin 12.0 - 15.0 g/dL 88.9  83.2  85.5   Hematocrit 36.0 - 46.0 % 30.8  49.0  44.9   Platelets 150 - 400 K/uL 246   331      Most recent CMP    Latest Ref Rng & Units 05/02/2023    9:08 AM 09/05/2022    3:48 PM 09/03/2022    2:47 PM  CMP  Glucose 65 - 99 mg/dL  485  704  583   BUN 7 - 25 mg/dL 8   5   Creatinine 9.49 - 0.99 mg/dL 8.94   9.16   Sodium 864 - 146 mmol/L 130   132   Potassium 3.5 - 5.3 mmol/L 4.0   3.9   Chloride 98 - 110  mmol/L 97   96   CO2 20 - 32 mmol/L 19   20   Calcium  8.6 - 10.2 mg/dL 9.4   9.4     Renal function CrCl cannot be calculated (Patient's most recent lab result is older than the maximum 21 days allowed.).  Hemoglobin A1C (%)  Date Value  07/24/2023 9.1 (A)   Hgb A1c MFr Bld (% of total Hgb)  Date Value  05/02/2023 10.2 (H)    LDL Cholesterol (Calc)  Date Value Ref Range Status  05/02/2023 141 (H) mg/dL (calc) Final    Comment:    Reference range: <100 . Desirable range <100 mg/dL for primary prevention;   <70 mg/dL for patients with CHD or diabetic patients  with > or = 2 CHD risk factors. SABRA LDL-C is now calculated using the Martin-Hopkins  calculation, which is a validated novel method providing  better accuracy than the Friedewald equation in the  estimation of LDL-C.  Gladis APPLETHWAITE et al. SANDREA. 7986;689(80): 2061-2068  (http://education.[facility].com/faq/FAQ164)    Direct LDL  Date Value Ref Range Status  02/14/2022 64.0 mg/dL Final    Comment:    Optimal:  <100 mg/dLNear or Above Optimal:  100-129 mg/dLBorderline High:  130-159 mg/dLHigh:  160-189 mg/dLVery High:  >190 mg/dL      +-------+-----------+-----------+------------+------------+  ABI/TBIToday's ABIToday's TBIPrevious ABIPrevious TBI  +-------+-----------+-----------+------------+------------+  Right .75        .44                                  +-------+-----------+-----------+------------+------------+  Left  1.28       .92                                  +-------+-----------+-----------+------------+------------+    Samantha SAILOR. Magda, MD FACS [facility] Phone Number: [phone] 08/11/2023 9:21 PM   Total time spent on preparing this encounter  including chart review, data review, collecting history, examining the patient, and coordinating care: 45 minutes  Portions of this report may have been transcribed using voice recognition software.  Every effort has been made to ensure accuracy; however, inadvertent computerized transcription errors may still be present.

## 2023-08-12 ENCOUNTER — Encounter: Payer: Self-pay | Admitting: Vascular Surgery

## 2023-08-12 ENCOUNTER — Ambulatory Visit: Attending: Vascular Surgery | Admitting: Vascular Surgery

## 2023-08-12 VITALS — BP 110/71 | HR 68 | Temp 97.8°F | Ht 63.0 in | Wt 174.0 lb

## 2023-08-12 DIAGNOSIS — I70211 Atherosclerosis of native arteries of extremities with intermittent claudication, right leg: Secondary | ICD-10-CM | POA: Diagnosis not present

## 2023-08-13 ENCOUNTER — Other Ambulatory Visit: Payer: Self-pay

## 2023-08-13 DIAGNOSIS — I70211 Atherosclerosis of native arteries of extremities with intermittent claudication, right leg: Secondary | ICD-10-CM

## 2023-08-14 ENCOUNTER — Ambulatory Visit: Payer: Self-pay | Admitting: Podiatry

## 2023-08-14 NOTE — Progress Notes (Signed)
 Confirm that she is seeing vascular for consult to discuss blood flow rightr

## 2023-08-14 NOTE — Progress Notes (Signed)
 No answer left message.

## 2023-08-15 NOTE — Progress Notes (Signed)
 Spoke to patient and has stated she has seen vascular and has another scheduled appointment.

## 2023-09-05 ENCOUNTER — Encounter: Admitting: Vascular Surgery

## 2023-09-11 ENCOUNTER — Ambulatory Visit: Admitting: Obstetrics and Gynecology

## 2023-09-11 ENCOUNTER — Encounter: Payer: Self-pay | Admitting: Obstetrics and Gynecology

## 2023-09-11 ENCOUNTER — Other Ambulatory Visit (HOSPITAL_COMMUNITY)
Admission: RE | Admit: 2023-09-11 | Discharge: 2023-09-11 | Disposition: A | Discharge status: Home / Self Care | Source: Ambulatory Visit | Attending: Obstetrics and Gynecology | Admitting: Obstetrics and Gynecology

## 2023-09-11 VITALS — BP 99/67 | HR 64 | Ht 63.0 in | Wt 180.4 lb

## 2023-09-11 DIAGNOSIS — Z124 Encounter for screening for malignant neoplasm of cervix: Secondary | ICD-10-CM

## 2023-09-11 DIAGNOSIS — Z1331 Encounter for screening for depression: Secondary | ICD-10-CM

## 2023-09-11 DIAGNOSIS — Z1231 Encounter for screening mammogram for malignant neoplasm of breast: Secondary | ICD-10-CM

## 2023-09-11 DIAGNOSIS — D219 Benign neoplasm of connective and other soft tissue, unspecified: Secondary | ICD-10-CM | POA: Diagnosis not present

## 2023-09-11 DIAGNOSIS — Z01419 Encounter for gynecological examination (general) (routine) without abnormal findings: Secondary | ICD-10-CM

## 2023-09-11 DIAGNOSIS — Z1211 Encounter for screening for malignant neoplasm of colon: Secondary | ICD-10-CM

## 2023-09-11 NOTE — Progress Notes (Signed)
 ANNUAL GYNECOLOGY VISIT Chief Complaint  Patient presents with   Gynecologic Exam     Subjective:  Samantha Willis is a 48 y.o. [phone] who presents for annual exam.  Reports history of fibroids and heavy/painful periods. Finds the symptoms bothersome. Previously had IUD but it got stuck in her fibroids and had to be removed under ultrasound guidance. Also had OCPs to try to help but they didn't work well for her.  Requests pap today  Gyn History: Patient's last menstrual period was 09/08/2023. Contraception: tubal sterilization Last pap:  Lab Results  Component Value Date   DIAGPAP  06/06/2021    - Negative for intraepithelial lesion or malignancy (NILM)  History of abnormal pap: Yes: ~ 25 years ago but normal since, denies hx procedures Periods: regular, last 6-7 days, describes as heavy and painful Last mammogram: due Last colonoscopy: never    The pregnancy intention screening data noted above was reviewed. Potential methods of contraception were discussed. The patient elected to proceed with No data recorded.       09/11/2023    4:01 PM 07/11/2021    2:15 PM 06/08/2020    2:49 PM 05/19/2019   11:12 AM  Depression screen PHQ 2/9  Decreased Interest 2 0 2 0  Down, Depressed, Hopeless 2 0 2 0  PHQ - 2 Score 4 0 4 0  Altered sleeping 3 1 3 2   Tired, decreased energy 2 2 3  0  Change in appetite 2 3 2  0  Feeling bad or failure about yourself  2 2 1  0  Trouble concentrating 1 2 1  0  Moving slowly or fidgety/restless 1 0 2 0  Suicidal thoughts 0 0 0 0  PHQ-9 Score 15 10 16 2   Difficult doing work/chores  Not difficult at all  Not difficult at all        09/11/2023    4:02 PM 05/19/2019   11:38 AM  GAD 7 : Generalized Anxiety Score  Nervous, Anxious, on Edge 3 3  Control/stop worrying 2 3  Worry too much - different things 2 3  Trouble relaxing 2 3  Restless 1 3  Easily annoyed or irritable 2 3  Afraid - awful might happen 2 3  Total GAD 7 Score  14 21      OB History     Gravida  5   Para  2   Term  2   Preterm      AB  3   Living  2      SAB  0   IAB      Ectopic  3   Multiple      Live Births  2           Past Medical History:  Diagnosis Date   Anxiety    Carpal tunnel syndrome 05/06/2014    Bilateral   Diabetic ketoacidosis (HCC) 11/17/2019   GERD (gastroesophageal reflux disease)    Graves disease    Graves Disease   Heart palpitations    Obesity     Past Surgical History:  Procedure Laterality Date   Biopsy of Right Breast     At [facility]   CARPAL TUNNEL RELEASE Right 11/21/2015   Procedure: CARPAL TUNNEL RELEASE right;  Surgeon: Arley Curia, MD;  Location: [facility];  Service: Orthopedics;  Laterality: Right;  FAB   CARPAL TUNNEL RELEASE Left 06/11/2016   Procedure: LEFT CARPAL TUNNEL RELEASE;  Surgeon: Curia Arley, MD;  Location: [facility];  Service: Orthopedics;  Laterality: Left;  REG/FAB   CESAREAN SECTION     IUD REMOVAL     TUBAL LIGATION  2011   WISDOM TOOTH EXTRACTION      Social History   Socioeconomic History   Marital status: Married    Spouse name: Not on file   Number of children: 2   Years of education: some col.   Highest education level: Not on file  Occupational History   Occupation: unemployed  Tobacco Use   Smoking status: Former    Current packs/day: 0.00    Types: Cigarettes    Quit date: 11/10/2019    Years since quitting: 3.8   Smokeless tobacco: Never  Vaping Use   Vaping status: Never Used  Substance and Sexual Activity   Alcohol use: Not Currently    Comment: Occassionally   Drug use: No   Sexual activity: Yes    Partners: Male    Birth control/protection: Surgical    Comment: btl  Other Topics Concern   Not on file  Social History Narrative   Lives at home w/ her fiance, son, brother and dad   Patient drinks 1 soda per week.   Patient is right handed   Social Drivers of Research scientist (physical sciences) Strain: Not on file  Food Insecurity: Not on file  Transportation Needs: Not on file  Physical Activity: Not on file  Stress: Not on file  Social Connections: Not on file    Family History  Problem Relation Age of Onset   Heart attack Mother    Cancer Mother 50       breast   Diabetes Mother        II   Hypertension Father    Hyperlipidemia Father    Gout Father    Healthy Sister    Healthy Brother    Healthy Brother     Current Outpatient Medications on File Prior to Visit  Medication Sig Dispense Refill   atorvastatin (LIPITOR) 80 MG tablet Take 1 tablet (80 mg total) by mouth daily. 90 tablet 3   BD PEN NEEDLE NANO 2ND GEN 32G X 4 MM MISC USE DAILY FOR GLUCOSE CONTROL 100 each 3   blood glucose meter kit and supplies Dispense based on patient and insurance preference. Use up to four times daily as directed. (FOR ICD-10 E10.9, E11.9). 1 each 0   Blood Glucose Monitoring Suppl DEVI 1 each by Does not apply route in the morning, at noon, and at bedtime. May substitute to any manufacturer covered by patient's insurance. 1 each 0   Continuous Blood Gluc Receiver (FREESTYLE LIBRE 14 DAY READER) DEVI 1 each by Does not apply route daily. 1 each 2   Glucose Blood (BLOOD GLUCOSE TEST STRIPS) STRP 1 each by In Vitro route in the morning, at noon, and at bedtime. May substitute to any manufacturer covered by patient's insurance. 100 each 3   insulin glargine (LANTUS SOLOSTAR) 100 UNIT/ML Solostar Pen Inject 28 Units into the skin daily. 15 mL 4   Insulin Pen Needle 32G X 4 MM MISC Use 4x a day 300 each 3   Insulin Syringe-Needle U-100 27G X 1/2 1 ML MISC Use daily for glucose control.  DxE11.9 100 each 3   NOVOLOG FLEXPEN 100 UNIT/ML FlexPen Take 5-10 units with meals three times a day, maximum 30 units/day. 15 mL 11   Continuous Glucose Sensor (FREESTYLE LIBRE 3 PLUS SENSOR) MISC 1  each by Does not apply route continuous. Change every 15 days. (Patient not taking: Reported on  09/11/2023) 6 each 4   sertraline  (ZOLOFT ) 100 MG tablet Take 1 tablet (100 mg total) by mouth daily. APPT NEEDED FOR FUTURE REFILLS (Patient not taking: Reported on 09/11/2023) 30 tablet 0   No current facility-administered medications on file prior to visit.    No Known Allergies   Objective:   Vitals:   09/11/23 1538  BP: 99/67  Pulse: 64  Weight: 180 lb 6.4 oz (81.8 kg)  Height: 5' 3 (1.6 m)   Physical Examination:   General appearance - well appearing, and in no distress  Mental status - alert, oriented to person, place, and time  Psych:  normal mood and affect  Skin - warm and dry, normal color, no suspicious lesions noted  Breasts - breasts appear normal, no suspicious masses, no skin or nipple changes or  axillary nodes  Abdomen - soft, nontender, nondistended, no masses or organomegaly  Pelvic -  VULVA: normal appearing vulva with no masses, tenderness or lesions   VAGINA: normal appearing vagina with normal color and discharge, no lesions   CERVIX: normal appearing cervix without discharge or lesions, no CMT  Thin prep pap is done with HR HPV cotesting  UTERUS: uterus is felt to be normal size, shape, consistency and nontender   ADNEXA: No adnexal masses or tenderness noted.  Extremities:  No swelling or varicosities noted  Chaperone present for exam  Assessment and Plan:  1. Well woman exam with routine gynecological exam (Primary) Pap/HPV Mammo ordered GI referral for colonoscopy  2. Encounter for screening mammogram for malignant neoplasm of breast - MM 3D SCREENING MAMMOGRAM BILATERAL BREAST; Future  3. Positive screening for depression on 9-item Patient Health Questionnaire (PHQ-9) Patient interested in referral for BH - Amb ref to [facility]  4. Colon cancer screening GI referral  5. Fibroids Patient symptomatic, will check CBC and do ultrasound to assess, f/u in office after to review results and management options - CBC - US   PELVIC COMPLETE WITH TRANSVAGINAL; Future  6. Cervical cancer screening - Cytology - PAP( [place])    No follow-ups on file.  Future Appointments  Date Time Provider Department Center  11/05/2023  3:00 PM Thapa, [place], MD [facility] None  11/11/2023  1:00 PM [facility]  11/11/2023  1:40 PM Magda Debby SAILOR, MD [facility] H[facility]V    Rollo ONEIDA Bring, MD, FACOG Obstetrician & Gynecologist, [facility] for [facility], [facility]

## 2023-09-11 NOTE — Progress Notes (Signed)
 Type 1 diabetic. 3 ectopic pregnancies. Would like to discuss fibroids. Currently on menses. Family history of cervical cancer and breast cancer.   PHQ 15 GAD 14. Referral ordered.

## 2023-09-12 ENCOUNTER — Ambulatory Visit: Payer: Self-pay | Admitting: Obstetrics and Gynecology

## 2023-09-12 LAB — CBC
Hematocrit: 37.4 % (ref 34.0–46.6)
Hemoglobin: 12 g/dL (ref 11.1–15.9)
MCH: 32.9 pg (ref 26.6–33.0)
MCHC: 32.1 g/dL (ref 31.5–35.7)
MCV: 103 fL — ABNORMAL HIGH (ref 79–97)
Platelets: 334 x10E3/uL (ref 150–450)
RBC: 3.65 x10E6/uL — ABNORMAL LOW (ref 3.77–5.28)
RDW: 12.6 % (ref 11.7–15.4)
WBC: 7.3 x10E3/uL (ref 3.4–10.8)

## 2023-09-16 ENCOUNTER — Ambulatory Visit (HOSPITAL_COMMUNITY)
Admission: RE | Admit: 2023-09-16 | Discharge: 2023-09-16 | Disposition: A | Discharge status: Home / Self Care | Source: Ambulatory Visit | Attending: Obstetrics and Gynecology | Admitting: Obstetrics and Gynecology

## 2023-09-16 DIAGNOSIS — Z86018 Personal history of other benign neoplasm: Secondary | ICD-10-CM | POA: Diagnosis not present

## 2023-09-16 DIAGNOSIS — D250 Submucous leiomyoma of uterus: Secondary | ICD-10-CM | POA: Diagnosis not present

## 2023-09-16 DIAGNOSIS — D219 Benign neoplasm of connective and other soft tissue, unspecified: Secondary | ICD-10-CM | POA: Diagnosis not present

## 2023-09-16 LAB — CYTOLOGY - PAP
Comment: NEGATIVE
Diagnosis: NEGATIVE
High risk HPV: NEGATIVE

## 2023-09-25 ENCOUNTER — Ambulatory Visit (INDEPENDENT_AMBULATORY_CARE_PROVIDER_SITE_OTHER): Admitting: Obstetrics and Gynecology

## 2023-09-25 VITALS — BP 112/77 | HR 87 | Wt 175.0 lb

## 2023-09-25 DIAGNOSIS — N920 Excessive and frequent menstruation with regular cycle: Secondary | ICD-10-CM | POA: Diagnosis not present

## 2023-09-25 DIAGNOSIS — D251 Intramural leiomyoma of uterus: Secondary | ICD-10-CM

## 2023-09-25 MED ORDER — TRANEXAMIC ACID 650 MG PO TABS: 1300.0000 mg | ORAL_TABLET | Freq: Three times a day (TID) | ORAL | 6 refills | Status: DC

## 2023-09-25 NOTE — Progress Notes (Addendum)
   ESTABLISHED GYNECOLOGY VISIT Chief Complaint  Patient presents with   Follow-up    Subjective:  Samantha Willis is a 48 y.o. [id number] presenting for ultrasound follow up.  Final ultrasound report pending however review of images demonstrates: Intramural fibroid, posterior 4.6 cm EMS 4.01 mm  She reports periods are regular, denies intermenstrual bleeding and menorrhagia has been a chronic issue. Recently had one episode of postcoital spotting (most recent pap normal).  Review of Systems:   Pertinent items are noted in HPI  Pertinent History Reviewed:  Reviewed past medical,surgical, social and family history.  Reviewed problem list, medications and allergies.  Objective:   Vitals:   09/25/23 1601  BP: 112/77  Pulse: 87  Weight: 175 lb (79.4 kg)   Physical Examination:   General appearance - well appearing, and in no distress  Mental status - alert, oriented to person, place, and time   Assessment and Plan:  1. Menorrhagia with regular cycle (Primary) Reviewed ultrasound in detail with patient. Reviewed options for management including medical such as progestins, TXA, OCPs, IUD, depo, etc. She elects TXA. She denies history of thrombosis. She was counseled on slightly increased risk of VTE with this medication. If symptoms not improving or develops irregular bleeding, consider EMB. Deferred at this time since this is a chronic issue and periods are regular and EMS 4 mm - f/u in 2-3 months to assess response - tranexamic acid  (LYSTEDA ) 650 MG TABS tablet; Take 2 tablets (1,300 mg total) by mouth 3 (three) times daily. Take during menses for a maximum of five days  Dispense: 30 tablet; Refill: 6  2. Intramural leiomyoma of uterus - tranexamic acid  (LYSTEDA ) 650 MG TABS tablet; Take 2 tablets (1,300 mg total) by mouth 3 (three) times daily. Take during menses for a maximum of five days  Dispense: 30 tablet; Refill: 6    Future Appointments  Date Time Provider  Department Center  11/05/2023  3:00 PM Thapa, [place], MD [facility] None  11/11/2023  1:00 PM [facility]V  11/11/2023  1:40 PM Magda Debby SAILOR, MD [facility]V    Rollo ONEIDA Bring, MD, FACOG Obstetrician & Gynecologist, [facility] for [facility], [facility]

## 2023-09-25 NOTE — Progress Notes (Signed)
 Pt states she has had some spotting after intercourse recently.

## 2023-11-05 ENCOUNTER — Ambulatory Visit (INDEPENDENT_AMBULATORY_CARE_PROVIDER_SITE_OTHER): Admitting: Endocrinology

## 2023-11-05 ENCOUNTER — Ambulatory Visit: Payer: Self-pay | Admitting: Endocrinology

## 2023-11-05 ENCOUNTER — Encounter: Payer: Self-pay | Admitting: Endocrinology

## 2023-11-05 ENCOUNTER — Other Ambulatory Visit

## 2023-11-05 VITALS — BP 110/70 | HR 87 | Resp 20 | Ht 63.0 in | Wt 173.8 lb

## 2023-11-05 DIAGNOSIS — Z8639 Personal history of other endocrine, nutritional and metabolic disease: Secondary | ICD-10-CM | POA: Diagnosis not present

## 2023-11-05 DIAGNOSIS — E1065 Type 1 diabetes mellitus with hyperglycemia: Secondary | ICD-10-CM

## 2023-11-05 DIAGNOSIS — E782 Mixed hyperlipidemia: Secondary | ICD-10-CM | POA: Diagnosis not present

## 2023-11-05 LAB — POCT GLYCOSYLATED HEMOGLOBIN (HGB A1C): Hemoglobin A1C: 7.1 % — AB (ref 4.0–5.6)

## 2023-11-05 MED ORDER — LANTUS SOLOSTAR 100 UNIT/ML ~~LOC~~ SOPN: 25.0000 [IU] | PEN_INJECTOR | Freq: Every day | SUBCUTANEOUS | 4 refills | Status: DC

## 2023-11-05 MED ORDER — FREESTYLE LIBRE 3 PLUS SENSOR MISC: 1.0000 | Status: DC

## 2023-11-05 NOTE — Addendum Note (Signed)
 Addended by: ARELIA DIETRICH SAILOR on: 11/05/2023 04:36 PM   Modules accepted: Orders

## 2023-11-05 NOTE — Progress Notes (Signed)
 Outpatient Endocrinology Note Samantha Preslee Regas, MD  11/05/23  Patient's Name: Samantha Willis    DOB: 1976/01/25    MRN: [id number]                                                    REASON OF VISIT: Follow up  for type 1 diabetes mellitus  PCP: Adaeze Better, [place], MD  HISTORY OF PRESENT ILLNESS:   Samantha Willis is a 48 y.o. old female with past medical history listed below, is here for follow up of type 1 diabetes mellitus.    Pertinent Diabetes History: She was diagnosed with type 1 diabetes mellitus in 2021.  She was initially presented with diabetes ketoacidosis. Hemoglobin A1c was 14.7% at the time of diagnosis.  She has remained uncontrolled diabetes with hemoglobin A1c in the range of 7.3 to 14.7%, mostly in the range of 8 to 11%.  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  Previous diabetes education: Yes   Chronic Diabetes Complications : Retinopathy: no. Last ophthalmology exam was done on annually, reportedly. Nephropathy: no Peripheral neuropathy: no Coronary artery disease: no Stroke: no  Relevant comorbidities and cardiovascular risk factors: Obesity: no Body mass index is 30.79 kg/m.  Hypertension: yes Hyperlipidemia. yes  Current / Home Diabetic regimen includes: - Toujeo  28 units daily. - NovoLog  3-5 units with meals 2 - 3 times a day.  Prior diabetic medications: Trulicity , stopped in 2023.  Had taken metformin  in the past.  Glycemic data:   Did not bring glucometer to review.  She reports lately having low blood sugar, this morning 92 and other days she had up to 62 or 52.  Hypoglycemia: Patient has minor hypoglycemic episodes. Patient has hypoglycemia awareness.  Factors modifying glucose control: 1.  Diabetic diet assessment: Mostly 2 - 3 meals a day.  2.   Staying active or exercising: No formal exercise.  3.  Medication compliance: compliant some of the time.  Interval history  Diabetes has been as reviewed and noted above.  No glucose data to review.  She reports she is lately having low blood sugar especially in the early morning.  Hemoglobin A1c improved to 7.1%, congratulated her.  No hypo and hyperthyroid symptoms.  No other complaints today.  She also reports compliance with taking atorvastatin .  REVIEW OF SYSTEMS As per history of present illness.   PAST MEDICAL HISTORY: Past Medical History:  Diagnosis Date   Anxiety    Carpal tunnel syndrome 05/06/2014    Bilateral   Diabetic ketoacidosis (HCC) 11/17/2019   GERD (gastroesophageal reflux disease)    Graves disease    Graves Disease   Heart palpitations    Obesity     PAST SURGICAL HISTORY: Past Surgical History:  Procedure Laterality Date   Biopsy of Right Breast     At [facility]   CARPAL TUNNEL RELEASE Right 11/21/2015   Procedure: CARPAL TUNNEL RELEASE right;  Surgeon: Arley Curia, MD;  Location: [facility];  Service: Orthopedics;  Laterality: Right;  FAB   CARPAL TUNNEL RELEASE Left 06/11/2016   Procedure: LEFT CARPAL TUNNEL RELEASE;  Surgeon: Curia Arley, MD;  Location: [facility];  Service: Orthopedics;  Laterality: Left;  REG/FAB   CESAREAN SECTION     IUD REMOVAL     TUBAL LIGATION  2011   WISDOM TOOTH EXTRACTION      ALLERGIES: No Known Allergies  FAMILY HISTORY:  Family History  Problem Relation Age of Onset   Heart attack Mother    Cancer Mother 77       breast   Diabetes Mother        II   Hypertension Father    Hyperlipidemia Father    Gout Father    Healthy Sister    Healthy Brother    Healthy Brother     SOCIAL HISTORY: Social History   Socioeconomic History   Marital status: Married    Spouse name: Not on file   Number of children: 2   Years of education: some col.   Highest education level:  Not on file  Occupational History   Occupation: unemployed  Tobacco Use   Smoking status: Former    Current packs/day: 0.00    Types: Cigarettes    Quit date: 11/10/2019    Years since quitting: 3.9   Smokeless tobacco: Never  Vaping Use   Vaping status: Never Used  Substance and Sexual Activity   Alcohol use: Not Currently    Comment: Occassionally   Drug use: No   Sexual activity: Yes    Partners: Male    Birth control/protection: Surgical    Comment: btl  Other Topics Concern   Not on file  Social History Narrative   Lives at home w/ her fiance, son, brother and dad   Patient drinks 1 soda per week.   Patient is right handed   Social Drivers of Corporate investment banker Strain: Not on file  Food Insecurity: Not on file  Transportation Needs: Not on file  Physical Activity: Not on file  Stress: Not on file  Social Connections: Not on file    MEDICATIONS:  Current Outpatient Medications  Medication Sig Dispense Refill   atorvastatin  (LIPITOR) 80 MG tablet Take 1 tablet (80 mg total) by mouth daily. 90 tablet 3   BD PEN NEEDLE NANO 2ND GEN 32G X 4 MM MISC USE DAILY FOR GLUCOSE CONTROL 100 each 3   blood glucose meter kit and supplies Dispense based on patient and insurance preference. Use up to four times daily as directed. (FOR ICD-10 E10.9, E11.9). 1 each 0   Blood Glucose Monitoring Suppl DEVI 1 each by Does not apply route in the morning, at noon, and at bedtime. May substitute to any manufacturer covered by patient's insurance. 1 each 0   Insulin  Pen Needle 32G X 4 MM MISC Use 4x a day 300 each 3   Insulin  Syringe-Needle U-100 27G X 1/2 1 ML MISC Use daily for glucose control.  DxE11.9 100 each 3   NOVOLOG  FLEXPEN 100 UNIT/ML FlexPen Take 5-10 units with meals three times a day, maximum 30 units/day. 15 mL 11   Continuous Glucose Sensor (FREESTYLE LIBRE 3 PLUS SENSOR) MISC 1 each by Does not apply route continuous. Change every 15 days. (Patient not taking:  Reported on 11/05/2023) 6 each 4   insulin  glargine (LANTUS  SOLOSTAR) 100 UNIT/ML Solostar Pen Inject 25 Units into the skin daily. 15 mL 4   No current facility-administered medications for this visit.    PHYSICAL EXAM: Vitals:   11/05/23 1512  BP: 110/70  Pulse: 87  Resp: 20  SpO2: 97%  Weight: 173  lb 12.8 oz (78.8 kg)  Height: 5' 3 (1.6 m)      Body mass index is 30.79 kg/m.  Wt Readings from Last 3 Encounters:  11/05/23 173 lb 12.8 oz (78.8 kg)  09/25/23 175 lb (79.4 kg)  09/11/23 180 lb 6.4 oz (81.8 kg)    General: Well developed, well nourished female in no apparent distress.  HEENT: AT/[place], no external lesions.  Eyes: Conjunctiva clear and no icterus. Neck: Neck supple  Lungs: Respirations not labored Neurologic: Alert, oriented, normal speech Extremities / Skin: Dry.  Psychiatric: Does not appear depressed or anxious  Diabetic Foot Exam - Simple   No data filed    LABS Reviewed Lab Results  Component Value Date   HGBA1C 7.1 (A) 11/05/2023   HGBA1C 9.1 (A) 07/24/2023   HGBA1C 10.2 (H) 05/02/2023   Lab Results  Component Value Date   FRUCTOSAMINE 459 (H) 02/14/2022   Lab Results  Component Value Date   CHOL 228 (H) 05/02/2023   HDL 69 05/02/2023   LDLCALC 141 (H) 05/02/2023   LDLDIRECT 64.0 02/14/2022   TRIG 85 05/02/2023   CHOLHDL 3.3 05/02/2023   No results found for: [facility]  Lab Results  Component Value Date   CREATININE 1.05 (H) 05/02/2023   Lab Results  Component Value Date   GFR 84.57 09/03/2022    Latest Reference Range & Units 09/05/22 15:48  Glucose 70 - 99 mg/dL 704 (H)  IA-2 Antibody <5.4 U/mL <5.4  ZNT8 Antibodies <15 U/mL 47 (H)  Glutamic Acid Decarb Ab <5 IU/mL >250 (H)  C-Peptide 0.80 - 3.85 ng/mL 0.41 (L)  (H): Data is abnormally high (L): Data is abnormally low  ASSESSMENT / PLAN  1. Uncontrolled type 1 diabetes mellitus with hyperglycemia, with long-term current use of insulin  (HCC)   2. Mixed  hyperlipidemia   3. H/O Graves' disease     Diabetes Mellitus type 1, no known complications. - Diabetic status / severity: Uncontrolled improving.  Lab Results  Component Value Date   HGBA1C 7.1 (A) 11/05/2023    - Hemoglobin A1c goal : <6.5%  No glucose data to review.  She reports she is lately having early morning mild hypoglycemia.  Adjusted diabetes regimen as follows. - Medications: Adjusted as follows.  I) decrease Lantus  from 28 to 25 units daily. II) adjust NovoLog  3 to 10 units based on meal size and blood sugar around that time, 3 times a day with meals.  - Home glucose testing: With meals 3 times a day and at bedtime.  The last visit sent prescription for freestyle libre 3+.  Encouraged to try CGM.  Advised to bring glucometer and CGM in follow-up visit.  She has not been using CGM.  - Discussed/ Gave Hypoglycemia treatment plan.  # Consult : not required at this time.   # Annual urine for microalbuminuria/ creatinine ratio, no microalbuminuria currently.  Will check today.  Will check BMP with eGFR. Last  No results found for: MICRALBCREAT   # Foot check nightly.  # Annual dilated diabetic eye exams.   - Diet: Make healthy diabetic food choices - Life style / activity / exercise: Discussed.  2. Blood pressure  -  BP Readings from Last 1 Encounters:  11/05/23 110/70    - Control is in target.  - No change in current plans.  3. Lipid status / Hyperlipidemia - Last  Lab Results  Component Value Date   LDLCALC 141 (H) 05/02/2023   - Continue atorvastatin  80  mg daily.  Reports compliance.   - Check lipid panel today.  # Patient has history of Graves' disease on remission.  Normal thyroid  function test in April 2025. - Check thyroid  function test today.  Diagnoses and all orders for this visit:  Uncontrolled type 1 diabetes mellitus with hyperglycemia, with long-term current use of insulin  (HCC) -     POCT glycosylated hemoglobin (Hb A1C) -      insulin  glargine (LANTUS  SOLOSTAR) 100 UNIT/ML Solostar Pen; Inject 25 Units into the skin daily. -     Basic metabolic panel with GFR -     Microalbumin / creatinine urine ratio  Mixed hyperlipidemia -     Lipid panel  H/O Graves' disease -     T4, free -     T3, free -     TSH     DISPOSITION Follow up in clinic in 3 months suggested.  Labs today.   All questions answered and patient verbalized understanding of the plan.  Samantha Jakorian Marengo, MD [facility] Endocrinology [facility] [address] [place], [address] [place], [place] [address] Phone # [phone]  At least part of this note was generated using voice recognition software. Inadvertent word errors may have occurred, which were not recognized during the proofreading process.

## 2023-11-05 NOTE — Patient Instructions (Signed)
 Latest Reference Range & Units 05/02/23 09:08 07/24/23 15:07 11/05/23 15:15  Hemoglobin A1C 4.0 - 5.6 % -  10.2 (H) 9.1 ! Pend 7.1 ! Pend  (H): Data is abnormally high !: Data is abnormal

## 2023-11-06 LAB — TSH: TSH: 2.55 m[IU]/L

## 2023-11-06 LAB — LIPID PANEL
Cholesterol: 142 mg/dL (ref <200)
HDL: 75 mg/dL (ref >=50)
LDL Cholesterol (Calc): 56 mg/dL
Non-HDL Cholesterol (Calc): 67 mg/dL (ref <130)
Total CHOL/HDL Ratio: 1.9 (calc) (ref <5.0)
Triglycerides: 40 mg/dL (ref <150)

## 2023-11-06 LAB — BASIC METABOLIC PANEL WITH GFR
BUN: 9 mg/dL (ref 7–25)
CO2: 26 mmol/L (ref 20–32)
Calcium: 9.3 mg/dL (ref 8.6–10.2)
Chloride: 108 mmol/L (ref 98–110)
Creat: 0.69 mg/dL (ref 0.50–0.99)
Glucose, Bld: 61 mg/dL — ABNORMAL LOW (ref 65–99)
Potassium: 4.3 mmol/L (ref 3.5–5.3)
Sodium: 142 mmol/L (ref 135–146)
eGFR: 108 mL/min/1.73m2 (ref >=60)

## 2023-11-06 LAB — T3, FREE: T3, Free: 2.7 pg/mL (ref 2.3–4.2)

## 2023-11-06 LAB — MICROALBUMIN / CREATININE URINE RATIO
Creatinine, Urine: 204 mg/dL (ref 20–275)
Microalb Creat Ratio: 9 mg/g{creat} (ref <30)
Microalb, Ur: 1.8 mg/dL

## 2023-11-06 LAB — T4, FREE: Free T4: 1 ng/dL (ref 0.8–1.8)

## 2023-11-10 NOTE — Progress Notes (Unsigned)
 [facility] [place]  ASSESSMENT / PLAN: Samantha Willis is a 48 y.o. 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  Recommend:  Abstinence from all tobacco products. Blood glucose control with goal A1c < 7%. Blood pressure control with goal blood pressure < 130/80 mmHg. Lipid reduction therapy with goal LDL-C < 55 mg/dL. Aspirin 81mg  by mouth daily. Atorvastatin  40-80mg  PO QD (or other high intensity statin therapy). Daily walking to and past the point of discomfort.  Significant improvement in symptoms.  Patient has quit smoking!  I encouraged her to keep taking care of herself and to keep working on exercise therapy.  CHIEF COMPLAINT: Abnormal vascular exam  HISTORY OF PRESENT ILLNESS: Samantha Willis is a 48 y.o. female referred to clinic for abnormal vascular exam found during podiatry evaluation. The patient described paresthesias, plantar foot pain, and difficulty bearing weight after dancing at a party a few weeks ago. Prior, she noted cramping discomfort in her right calf typical of claudication. She does not describe rest pain symptoms. She has no ischemic ulceration of the foot.  She does report some radiating discomfort down her leg which to me sounds typical of spinal stenosis.  11/11/23: Patient returns to care after trial of medical therapy for claudication.  She has taken my recommendations to heart and has quit smoking!  I congratulated her on this excellent achievement.  She is also walking more, and reports improvement in performance.  She has no disabling symptoms.  She still has some cramping discomfort in her leg which is typical of claudication.  No rest pain.  No ulcers.   Past Medical History:  Diagnosis Date   Anxiety    Carpal tunnel syndrome 05/06/2014    Bilateral   Diabetic ketoacidosis (HCC) 11/17/2019   GERD (gastroesophageal reflux disease)    Graves  disease    Graves Disease   Heart palpitations    Obesity     Past Surgical History:  Procedure Laterality Date   Biopsy of Right Breast     At [facility]   CARPAL TUNNEL RELEASE Right 11/21/2015   Procedure: CARPAL TUNNEL RELEASE right;  Surgeon: Arley Curia, MD;  Location: [facility];  Service: Orthopedics;  Laterality: Right;  FAB   CARPAL TUNNEL RELEASE Left 06/11/2016   Procedure: LEFT CARPAL TUNNEL RELEASE;  Surgeon: Curia Arley, MD;  Location: [facility];  Service: Orthopedics;  Laterality: Left;  REG/FAB   CESAREAN SECTION     IUD REMOVAL     TUBAL LIGATION  2011   WISDOM TOOTH EXTRACTION      Family History  Problem Relation Age of Onset   Heart attack Mother    Cancer Mother 87       breast   Diabetes Mother        II   Hypertension Father    Hyperlipidemia Father    Gout Father    Healthy Sister    Healthy Brother    Healthy Brother     Social History   Socioeconomic History   Marital status: Married    Spouse name: Not on file   Number of children: 2   Years of education: some col.   Highest education level: Not on file  Occupational History   Occupation: unemployed  Tobacco Use   Smoking status: Former    Current packs/day: 0.00    Types: Cigarettes    Quit date: 11/10/2019  Years since quitting: 4.0   Smokeless tobacco: Never  Vaping Use   Vaping status: Never Used  Substance and Sexual Activity   Alcohol use: Not Currently    Comment: Occassionally   Drug use: No   Sexual activity: Yes    Partners: Male    Birth control/protection: Surgical    Comment: btl  Other Topics Concern   Not on file  Social History Narrative   Lives at home w/ her fiance, son, brother and dad   Patient drinks 1 soda per week.   Patient is right handed   Social Drivers of Corporate investment banker Strain: Not on file  Food Insecurity: Not on file  Transportation Needs: Not on file  Physical Activity: Not on file   Stress: Not on file  Social Connections: Not on file  Intimate Partner Violence: Not on file    No Known Allergies  Current Outpatient Medications  Medication Sig Dispense Refill   atorvastatin  (LIPITOR) 80 MG tablet Take 1 tablet (80 mg total) by mouth daily. 90 tablet 3   BD PEN NEEDLE NANO 2ND GEN 32G X 4 MM MISC USE DAILY FOR GLUCOSE CONTROL 100 each 3   blood glucose meter kit and supplies Dispense based on patient and insurance preference. Use up to four times daily as directed. (FOR ICD-10 E10.9, E11.9). 1 each 0   Blood Glucose Monitoring Suppl DEVI 1 each by Does not apply route in the morning, at noon, and at bedtime. May substitute to any manufacturer covered by patient's insurance. 1 each 0   Continuous Glucose Sensor (FREESTYLE LIBRE 3 PLUS SENSOR) MISC 1 each by Does not apply route continuous. Change every 15 days. (Patient not taking: Reported on 11/05/2023) 6 each 4   Continuous Glucose Sensor (FREESTYLE LIBRE 3 PLUS SENSOR) MISC Inject 1 Device into the skin continuous. Change every 15 days     insulin  glargine (LANTUS  SOLOSTAR) 100 UNIT/ML Solostar Pen Inject 25 Units into the skin daily. 15 mL 4   Insulin  Pen Needle 32G X 4 MM MISC Use 4x a day 300 each 3   Insulin  Syringe-Needle U-100 27G X 1/2 1 ML MISC Use daily for glucose control.  DxE11.9 100 each 3   NOVOLOG  FLEXPEN 100 UNIT/ML FlexPen Take 5-10 units with meals three times a day, maximum 30 units/day. 15 mL 11   No current facility-administered medications for this visit.    PHYSICAL EXAM There were no vitals filed for this visit.  Middle aged woman in no acute distress Regular rate and rhythm Unlabored breathing No palpable pedal pulses in the right foot Palpable left PT pulse   PERTINENT LABORATORY AND RADIOLOGIC DATA  Most recent CBC    Latest Ref Rng & Units 09/11/2023    4:28 PM 06/29/2021    6:06 AM 06/27/2021    9:47 AM  CBC  WBC 3.4 - 10.8 x10E3/uL 7.3  12.0    Hemoglobin 11.1 - 15.9 g/dL  87.9  88.9  83.2   Hematocrit 34.0 - 46.6 % 37.4  30.8  49.0   Platelets 150 - 450 x10E3/uL 334  246       Most recent CMP    Latest Ref Rng & Units 11/05/2023    3:36 PM 05/02/2023    9:08 AM 09/05/2022    3:48 PM  CMP  Glucose 65 - 99 mg/dL 61  485  704   BUN 7 - 25 mg/dL 9  8    Creatinine 9.49 -  0.99 mg/dL 9.30  8.94    Sodium 864 - 146 mmol/L 142  130    Potassium 3.5 - 5.3 mmol/L 4.3  4.0    Chloride 98 - 110 mmol/L 108  97    CO2 20 - 32 mmol/L 26  19    Calcium  8.6 - 10.2 mg/dL 9.3  9.4      Renal function Estimated Creatinine Clearance: 86.5 mL/min (by C-G formula based on SCr of 0.69 mg/dL).  Hemoglobin A1C (%)  Date Value  11/05/2023 7.1 (A)   Hgb A1c MFr Bld (% of total Hgb)  Date Value  05/02/2023 10.2 (H)    LDL Cholesterol (Calc)  Date Value Ref Range Status  11/05/2023 56 mg/dL (calc) Final    Comment:    Reference range: <100 . Desirable range <100 mg/dL for primary prevention;   <70 mg/dL for patients with CHD or diabetic patients  with > or = 2 CHD risk factors. SABRA LDL-C is now calculated using the Martin-Hopkins  calculation, which is a validated novel method providing  better accuracy than the Friedewald equation in the  estimation of LDL-C.  Gladis APPLETHWAITE et al. SANDREA. 7986;689(80): 2061-2068  (http://education.[employer].com/faq/FAQ164)    Direct LDL  Date Value Ref Range Status  02/14/2022 64.0 mg/dL Final    Comment:    Optimal:  <100 mg/dLNear or Above Optimal:  100-129 mg/dLBorderline High:  130-159 mg/dLHigh:  160-189 mg/dLVery High:  >190 mg/dL     LOWER EXTREMITY DOPPLER STUDY   Patient Name:  Otisha Spickler  Date of Exam:   11/11/2023  Medical Rec #: [id number]                  Accession #:    [id number]  Date of Birth: March 31, 1975                  Patient Gender: F  Patient Age:   67 years  Exam Location:  [address]  Procedure:      VAS US  ABI WITH/WO TBI  Referring Phys: DEBBY ROBERTSON     ---------------------------------------------------------------------------  -----    Indications: Claudication, and peripheral artery disease.   High Risk Factors: Hyperlipidemia, Diabetes, past history of smoking.     Comparison       ABIs 08/08/23 were 0.75/0.44 on the right and 1.29/0.92  on the  Study:           left   Performing Technologist: Edsel Mustard RVT     Examination Guidelines: A complete evaluation includes at minimum, Doppler  waveform signals and systolic blood pressure reading at the level of  bilateral  brachial, anterior tibial, and posterior tibial arteries, when vessel  segments  are accessible. Bilateral testing is considered an integral part of a  complete  examination. Photoelectric Plethysmograph (PPG) waveforms and toe systolic  pressure readings are included as required and additional duplex testing  as  needed. Limited examinations for reoccurring indications may be performed  as  noted.     ABI Findings:  +---------+------------------+-----+----------+--------+  Right   Rt Pressure (mmHg)IndexWaveform  Comment   +---------+------------------+-----+----------+--------+  Brachial 126                                        +---------+------------------+-----+----------+--------+  PTA     109               0.87 monophasic          +---------+------------------+-----+----------+--------+  DP      85                0.67 monophasic          +---------+------------------+-----+----------+--------+  Great Toe49                0.39 Abnormal            +---------+------------------+-----+----------+--------+   +---------+------------------+-----+---------+-------+  Left    Lt Pressure (mmHg)IndexWaveform Comment  +---------+------------------+-----+---------+-------+  Brachial 126                                      +---------+------------------+-----+---------+-------+  PTA     125                0.99 triphasic         +---------+------------------+-----+---------+-------+  DP      152               1.21 triphasic         +---------+------------------+-----+---------+-------+  Great Toe141               1.12 Normal            +---------+------------------+-----+---------+-------+   +-------+-----------+-----------+------------+------------+  ABI/TBIToday's ABIToday's TBIPrevious ABIPrevious TBI  +-------+-----------+-----------+------------+------------+  Right 0.87       0.39       0.75        0.44          +-------+-----------+-----------+------------+------------+  Left  1.21       1.12       1.29        0.92          +-------+-----------+-----------+------------+------------+       Bilateral ABIs appear essentially unchanged compared to prior study on  08/08/23.    Summary:  Right: Resting right ankle-brachial index indicates mild right lower  extremity arterial disease. The right toe-brachial index is abnormal.    Left: Resting left ankle-brachial index is within normal range. The left  toe-brachial index is normal.     *See table(s) above for measurements and observations.   Debby SAILOR. Magda, MD FACS [facility] Phone Number: [phone] 11/10/2023 9:00 PM   Total time spent on preparing this encounter including chart review, data review, collecting history, examining the patient, and coordinating care: 45 minutes  Portions of this report may have been transcribed using voice recognition software.  Every effort has been made to ensure accuracy; however, inadvertent computerized transcription errors may still be present.

## 2023-11-11 ENCOUNTER — Ambulatory Visit: Admitting: Vascular Surgery

## 2023-11-11 ENCOUNTER — Ambulatory Visit (HOSPITAL_COMMUNITY)
Admission: RE | Admit: 2023-11-11 | Discharge: 2023-11-11 | Disposition: A | Discharge status: Home / Self Care | Source: Ambulatory Visit | Attending: Vascular Surgery | Admitting: Vascular Surgery

## 2023-11-11 ENCOUNTER — Encounter: Payer: Self-pay | Admitting: Vascular Surgery

## 2023-11-11 VITALS — BP 133/84 | HR 89 | Temp 98.6°F | Ht 63.0 in | Wt 173.0 lb

## 2023-11-11 DIAGNOSIS — I70211 Atherosclerosis of native arteries of extremities with intermittent claudication, right leg: Secondary | ICD-10-CM | POA: Diagnosis not present

## 2023-11-11 LAB — VAS US ABI WITH/WO TBI
Left ABI: 1.21
Right ABI: 0.87

## 2023-11-25 ENCOUNTER — Ambulatory Visit: Admitting: Endocrinology

## 2023-11-25 ENCOUNTER — Encounter: Payer: Self-pay | Admitting: Internal Medicine

## 2023-11-25 ENCOUNTER — Encounter: Payer: Self-pay | Admitting: Endocrinology

## 2023-11-25 VITALS — BP 126/60 | HR 78 | Resp 20 | Ht 63.0 in | Wt 176.8 lb

## 2023-11-25 DIAGNOSIS — E1065 Type 1 diabetes mellitus with hyperglycemia: Secondary | ICD-10-CM | POA: Diagnosis not present

## 2023-11-25 DIAGNOSIS — E782 Mixed hyperlipidemia: Secondary | ICD-10-CM | POA: Diagnosis not present

## 2023-11-25 DIAGNOSIS — T383X5A Adverse effect of insulin and oral hypoglycemic [antidiabetic] drugs, initial encounter: Secondary | ICD-10-CM

## 2023-11-25 DIAGNOSIS — Z8639 Personal history of other endocrine, nutritional and metabolic disease: Secondary | ICD-10-CM

## 2023-11-25 DIAGNOSIS — E160 Drug-induced hypoglycemia without coma: Secondary | ICD-10-CM

## 2023-11-25 MED ORDER — LANTUS SOLOSTAR 100 UNIT/ML ~~LOC~~ SOPN: 20.0000 [IU] | PEN_INJECTOR | Freq: Every day | SUBCUTANEOUS | 4 refills | Status: DC

## 2023-11-25 MED ORDER — GVOKE HYPOPEN 2-PACK 1 MG/0.2ML ~~LOC~~ SOAJ: 1.0000 mg | SUBCUTANEOUS | 1 refills | Status: AC | PRN

## 2023-11-25 NOTE — Progress Notes (Signed)
 Outpatient Endocrinology Note Samantha Mentzel, MD  11/25/23  Patient's Name: Samantha Willis    DOB: [birth year]    MRN: [id number]                                                    REASON OF VISIT: Follow up  for type 1 diabetes mellitus  PCP: Jakarie Pember, MD  HISTORY OF PRESENT ILLNESS:   Samantha Willis is a 48 y.o. old female with past medical history listed below, is here for follow up of type 1 diabetes mellitus.  Patient presented for early visit due to concern of hypoglycemia.  Pertinent Diabetes History: She was diagnosed with type 1 diabetes mellitus in 2021.  She was initially presented with diabetes ketoacidosis. Hemoglobin A1c was 14.7% at the time of diagnosis.  She has remained uncontrolled diabetes with hemoglobin A1c in the range of 7.3 to 14.7%, mostly in the range of 8 to 11%.  Patient has positive antibody GAD 65, zinc transporter 8 consistent with type 1 diabetes mellitus.Samantha Willis  History of DKA or diabetes related hospitalizations: Yes /at the time of diagnosis in October 2021 and in May 2023.  Previous diabetes education: Yes   Chronic Diabetes Complications : Retinopathy: no. Last ophthalmology exam was done on annually, reportedly. Nephropathy: no Peripheral neuropathy: no Coronary artery disease: no Stroke: no  Relevant comorbidities and cardiovascular risk factors: Obesity: no Body mass index is 31.32 kg/m.  Hypertension: yes Hyperlipidemia. yes  Current / Home Diabetic regimen includes: - Toujeo  25 units daily. - NovoLog  3-5 units with meals 2 - 3 times a day.  Prior diabetic medications: Trulicity , stopped in 2023.  Had taken metformin  in the past.  Glycemic data:    CONTINUOUS GLUCOSE MONITORING SYSTEM (CGMS) INTERPRETATION:            FreeStyle Libre 3+ CGM-  Sensor Download (Sensor download was reviewed and summarized below.) Dates: October 13 to November 23, 2023   Impression: She has variable blood sugar.  She has random  hypoglycemia in the early morning and sometime in between the meals especially in the evening.  Hypoglycemia mostly in upper 50-60s range.  She also has random hyperglycemia with blood sugar up to 250-300s postprandially usually related to high carb meal and sometimes related to correcting for hypoglycemia.  One of the days she had persistent hyperglycemia with blood sugar in 300s.  Some other times acceptable blood sugar.  Hypoglycemia: Patient has frequent minor hypoglycemic episodes. Patient has hypoglycemia awareness.  Factors modifying glucose control: 1.  Diabetic diet assessment: Mostly 2 - 3 meals a day.  2.  Staying active or exercising: No formal exercise.  3.  Medication compliance: compliant some of the time.  Interval history  Freestyle libre 3 CGM data as reviewed above, variable blood sugar with hypoglycemia.  Patient reports he started new job and noted frequent hypoglycemia.  She was taking Lantus  25 units daily and decrease from started to 20 units.  She has been taking NovoLog  3 to 5 units with meals.    No other complaints today.  REVIEW OF SYSTEMS As per history of present illness.   PAST MEDICAL HISTORY: Past Medical History:  Diagnosis Date   Anxiety    Carpal tunnel syndrome 05/06/2014    Bilateral   Diabetic ketoacidosis (HCC) 11/17/2019   GERD (gastroesophageal  reflux disease)    Graves disease    Graves Disease   Heart palpitations    Obesity     PAST SURGICAL HISTORY: Past Surgical History:  Procedure Laterality Date   Biopsy of Right Breast     At [facility]   CARPAL TUNNEL RELEASE Right 11/21/2015   Procedure: CARPAL TUNNEL RELEASE right;  Surgeon: Arley Curia, MD;  Location: [facility];  Service: Orthopedics;  Laterality: Right;  FAB   CARPAL TUNNEL RELEASE Left 06/11/2016   Procedure: LEFT CARPAL TUNNEL RELEASE;  Surgeon: Curia Arley, MD;  Location: [facility];  Service: Orthopedics;  Laterality: Left;   REG/FAB   CESAREAN SECTION     IUD REMOVAL     TUBAL LIGATION  2011   WISDOM TOOTH EXTRACTION      ALLERGIES: No Known Allergies  FAMILY HISTORY:  Family History  Problem Relation Age of Onset   Heart attack Mother    Cancer Mother 55       breast   Diabetes Mother        II   Hypertension Father    Hyperlipidemia Father    Gout Father    Healthy Sister    Healthy Brother    Healthy Brother     SOCIAL HISTORY: Social History   Socioeconomic History   Marital status: Married    Spouse name: Not on file   Number of children: 2   Years of education: some col.   Highest education level: Not on file  Occupational History   Occupation: unemployed  Tobacco Use   Smoking status: Former    Current packs/day: 0.00    Types: Cigarettes    Quit date: 11/10/2019    Years since quitting: 4.0   Smokeless tobacco: Never  Vaping Use   Vaping status: Never Used  Substance and Sexual Activity   Alcohol use: Not Currently    Comment: Occassionally   Drug use: No   Sexual activity: Yes    Partners: Male    Birth control/protection: Surgical    Comment: btl  Other Topics Concern   Not on file  Social History Narrative   Lives at home w/ her fiance, son, brother and dad   Patient drinks 1 soda per week.   Patient is right handed   Social Drivers of Corporate Investment Banker Strain: Not on file  Food Insecurity: Not on file  Transportation Needs: Not on file  Physical Activity: Not on file  Stress: Not on file  Social Connections: Not on file    MEDICATIONS:  Current Outpatient Medications  Medication Sig Dispense Refill   atorvastatin  (LIPITOR) 80 MG tablet Take 1 tablet (80 mg total) by mouth daily. 90 tablet 3   BD PEN NEEDLE NANO 2ND GEN 32G X 4 MM MISC USE DAILY FOR GLUCOSE CONTROL 100 each 3   blood glucose meter kit and supplies Dispense based on patient and insurance preference. Use up to four times daily as directed. (FOR ICD-10 E10.9, E11.9). 1 each 0    Blood Glucose Monitoring Suppl DEVI 1 each by Does not apply route in the morning, at noon, and at bedtime. May substitute to any manufacturer covered by patient's insurance. 1 each 0   Continuous Glucose Sensor (FREESTYLE LIBRE 3 PLUS SENSOR) MISC 1 each by Does not apply route continuous. Change every 15 days. 6 each 4   Continuous Glucose Sensor (FREESTYLE LIBRE 3 PLUS SENSOR) MISC Inject 1 Device into  the skin continuous. Change every 15 days     GVOKE HYPOPEN 2-PACK 1 MG/0.2ML SOAJ Inject 1 mg into the skin as needed (severe hypoglycemia with not able to take oral medication or severe alterned mental status / unconcious.). 1 mL 1   Insulin  Pen Needle 32G X 4 MM MISC Use 4x a day 300 each 3   Insulin  Syringe-Needle U-100 27G X 1/2 1 ML MISC Use daily for glucose control.  DxE11.9 100 each 3   NOVOLOG  FLEXPEN 100 UNIT/ML FlexPen Take 5-10 units with meals three times a day, maximum 30 units/day. 15 mL 11   insulin  glargine (LANTUS  SOLOSTAR) 100 UNIT/ML Solostar Pen Inject 20 Units into the skin daily. 15 mL 4   No current facility-administered medications for this visit.    PHYSICAL EXAM: Vitals:   11/25/23 1312  BP: 126/60  Pulse: 78  Resp: 20  SpO2: 98%  Weight: 176 lb 12.8 oz (80.2 kg)  Height: 5' 3 (1.6 m)      Body mass index is 31.32 kg/m.  Wt Readings from Last 3 Encounters:  11/25/23 176 lb 12.8 oz (80.2 kg)  11/11/23 173 lb (78.5 kg)  11/05/23 173 lb 12.8 oz (78.8 kg)    General: Well developed, well nourished female in no apparent distress.  HEENT: AT/, no external lesions.  Eyes: Conjunctiva clear and no icterus. Neck: Neck supple  Lungs: Respirations not labored Neurologic: Alert, oriented, normal speech Extremities / Skin: Dry.  Psychiatric: Does not appear depressed or anxious  Diabetic Foot Exam - Simple   No data filed    LABS Reviewed Lab Results  Component Value Date   HGBA1C 7.1 (A) 11/05/2023   HGBA1C 9.1 (A) 07/24/2023   HGBA1C 10.2 (H)  05/02/2023   Lab Results  Component Value Date   FRUCTOSAMINE 459 (H) 02/14/2022   Lab Results  Component Value Date   CHOL 142 11/05/2023   HDL 75 11/05/2023   LDLCALC 56 11/05/2023   LDLDIRECT 64.0 02/14/2022   TRIG 40 11/05/2023   CHOLHDL 1.9 11/05/2023   Lab Results  Component Value Date   MICRALBCREAT 9 11/05/2023    Lab Results  Component Value Date   CREATININE 0.69 11/05/2023   Lab Results  Component Value Date   GFR 84.57 09/03/2022    Latest Reference Range & Units 09/05/22 15:48  Glucose 70 - 99 mg/dL 704 (H)  IA-2 Antibody <5.4 U/mL <5.4  ZNT8 Antibodies <15 U/mL 47 (H)  Glutamic Acid Decarb Ab <5 IU/mL >250 (H)  C-Peptide 0.80 - 3.85 ng/mL 0.41 (L)  (H): Data is abnormally high (L): Data is abnormally low  ASSESSMENT / PLAN  1. Uncontrolled type 1 diabetes mellitus with hyperglycemia, with long-term current use of insulin  (HCC)   2. Hypoglycemia due to insulin    3. H/O Graves' disease   4. Mixed hyperlipidemia    Diabetes Mellitus type 1, no known complications. - Diabetic status / severity: Uncontrolled improving.  Hypoglycemia present.  Lab Results  Component Value Date   HGBA1C 7.1 (A) 11/05/2023    - Hemoglobin A1c goal : <6.5%  CGM data as reviewed above.  Hypoglycemia early morning and in between the meals.  Decrease basal insulin  as follows.  She needs more NovoLog  for meals especially with high carb meal.   Adjusted diabetes regimen as follows. - Medications: Adjusted as follows.  I) decrease Lantus  from 25 to 20 units daily. II) adjust NovoLog  3 to 8 units based on meal size and blood  sugar around that time, 3 times a day with meals.  She is currently taking 3 to 5 units.  - Home glucose testing: Continue CGM Freestyle libre 3+ check as needed.  - Discussed/ Gave Hypoglycemia treatment plan.  Discussed, sent prescription for Glucagon Emergency Kit.  Patient is advised to call in 2 weeks to review CGM data can be reviewed  remotely.   # Consult : not required at this time.   # Annual urine for microalbuminuria/ creatinine ratio, no microalbuminuria currently. Last  Lab Results  Component Value Date   MICRALBCREAT 9 11/05/2023     # Foot check nightly.  # Annual dilated diabetic eye exams.   - Diet: Make healthy diabetic food choices - Life style / activity / exercise: Discussed.  2. Blood pressure  -  BP Readings from Last 1 Encounters:  11/25/23 126/60    - Control is in target.  - No change in current plans.  3. Lipid status / Hyperlipidemia - Last  Lab Results  Component Value Date   LDLCALC 56 11/05/2023   - Continue atorvastatin  80 mg daily.      # Patient has history of Graves' disease on remission.  Normal thyroid  function test in October 2025.  Will check thyroid  function test every 6 to 12 months.  Diagnoses and all orders for this visit:  Uncontrolled type 1 diabetes mellitus with hyperglycemia, with long-term current use of insulin  (HCC) -     insulin  glargine (LANTUS  SOLOSTAR) 100 UNIT/ML Solostar Pen; Inject 20 Units into the skin daily. -     GVOKE HYPOPEN 2-PACK 1 MG/0.2ML SOAJ; Inject 1 mg into the skin as needed (severe hypoglycemia with not able to take oral medication or severe alterned mental status / unconcious.).  Hypoglycemia due to insulin  -     GVOKE HYPOPEN 2-PACK 1 MG/0.2ML SOAJ; Inject 1 mg into the skin as needed (severe hypoglycemia with not able to take oral medication or severe alterned mental status / unconcious.).  H/O Graves' disease  Mixed hyperlipidemia    DISPOSITION Follow up in clinic in 2-3 months suggested.  Asked to call our clinic with any questions.  All questions answered and patient verbalized understanding of the plan.  Alzena Gerber, MD [facility] [address] [place], [address] [place], [place] [address] Phone # [phone]  At least part of this note was generated using voice recognition  software. Inadvertent word errors may have occurred, which were not recognized during the proofreading process.

## 2023-11-25 NOTE — Patient Instructions (Addendum)
  Decrease Lantus  to 20 units daily. Take NovoLog  3 to 8 units with meals up to 3 times a day.  Take before eating.

## 2023-12-02 ENCOUNTER — Telehealth (HOSPITAL_COMMUNITY): Payer: Self-pay

## 2023-12-02 ENCOUNTER — Encounter (HOSPITAL_COMMUNITY): Payer: Self-pay

## 2023-12-02 NOTE — Telephone Encounter (Signed)
 Left VM to pt regarding SET(walking program) to improve claudication pain with activity. Will call again and send message via Epic.   Garen FORBES Candy MS, ACSM-CEP 12/02/2023 7:22 AM

## 2023-12-05 ENCOUNTER — Telehealth (HOSPITAL_COMMUNITY): Payer: Self-pay

## 2023-12-05 NOTE — Telephone Encounter (Signed)
 Received referral from Dr. Debby Robertson for this pt to participate in the supervised exercise therapy program with the diagnosis of Atherosclerosis of the right leg of with right leg intermittent claudication.  Pt seen in follow up on 11/11/23. The patient received information regarding cardiovascular disease and PAD risk factor reduction, which could include education, counseling, behavior interventions, and outcome assessments.  Pt had ABI completed on 11/11/23 which showed 0.81 Right and 1.21 Left.  I completed my phone interview with the pt and she is stable and interested in starting the program. Will forward information to our support staff and we'll reach out at a later date to schedule orientation.   Pt is interested in joining the 6:45am class.   Garen FORBES Candy MS, ACSM-CEP 12/05/2023 2:45 PM

## 2023-12-11 ENCOUNTER — Telehealth (HOSPITAL_COMMUNITY): Payer: Self-pay

## 2023-12-11 NOTE — Telephone Encounter (Signed)
 Pt insurance is active and benefits verified through [employer] Co-pay 0, DED $2,000/$2,000 met, out of pocket $5,000/$2,261.92 met, co-insurance 0%. no pre-authorization required, Alicia/[employer] 12/11/2023@2 :48, REF# [id number]   No Limit for Supervised Exercise Therapy (SET) Program.

## 2023-12-17 ENCOUNTER — Telehealth (HOSPITAL_COMMUNITY): Payer: Self-pay

## 2023-12-17 NOTE — Telephone Encounter (Signed)
 Called pt to schedule for SET orientation appt. Left VM with department number.

## 2024-01-14 IMAGING — DX DG CHEST 1V PORT
1 series · 1 of 1 positions shown · non-contrast
Comparison: Chest x-ray 11/17/2019.

CLINICAL DATA: 46-year-old female with history of chest pain and
emesis. Shortness of breath.

EXAM:
PORTABLE CHEST 1 VIEW

[chest]
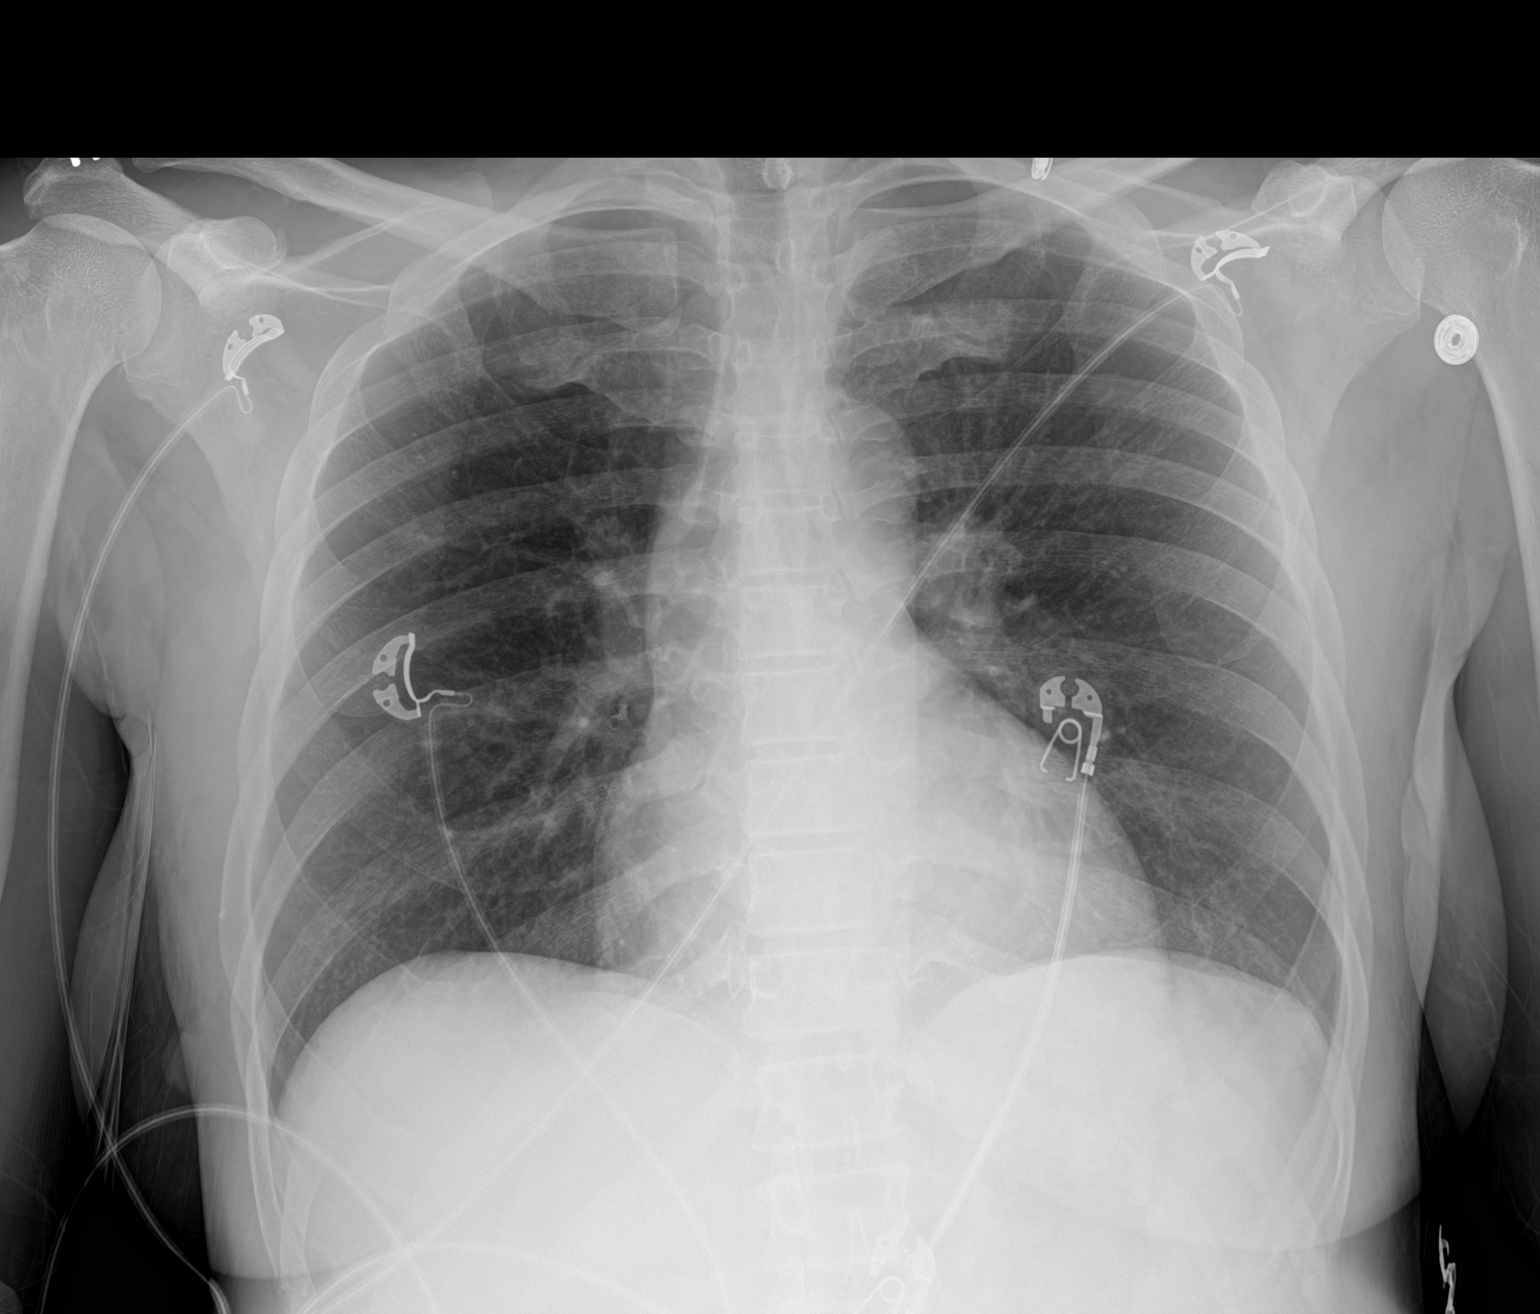

[1 of 1 positions shown; findings below may reference images not displayed]

FINDINGS: Lung volumes are normal. No consolidative airspace disease. No
pleural effusions. No pneumothorax. No pulmonary nodule or mass
noted. Pulmonary vasculature and the cardiomediastinal silhouette
are within normal limits.
IMPRESSION: No radiographic evidence of acute cardiopulmonary disease.

## 2024-02-05 ENCOUNTER — Ambulatory Visit: Payer: Self-pay | Admitting: Endocrinology

## 2024-02-05 ENCOUNTER — Encounter: Payer: Self-pay | Admitting: Endocrinology

## 2024-02-05 ENCOUNTER — Ambulatory Visit (INDEPENDENT_AMBULATORY_CARE_PROVIDER_SITE_OTHER): Admitting: Endocrinology

## 2024-02-05 ENCOUNTER — Ambulatory Visit: Admitting: Endocrinology

## 2024-02-05 VITALS — BP 128/80 | HR 68 | Resp 16 | Ht 63.0 in | Wt 178.4 lb

## 2024-02-05 DIAGNOSIS — Z8639 Personal history of other endocrine, nutritional and metabolic disease: Secondary | ICD-10-CM

## 2024-02-05 DIAGNOSIS — E1065 Type 1 diabetes mellitus with hyperglycemia: Secondary | ICD-10-CM | POA: Diagnosis not present

## 2024-02-05 LAB — POCT GLYCOSYLATED HEMOGLOBIN (HGB A1C): Hemoglobin A1C: 9.5 % — AB (ref 4.0–5.6)

## 2024-02-05 MED ORDER — NOVOLOG FLEXPEN 100 UNIT/ML ~~LOC~~ SOPN: PEN_INJECTOR | SUBCUTANEOUS | 4 refills | Status: AC

## 2024-02-05 MED ORDER — LANTUS SOLOSTAR 100 UNIT/ML ~~LOC~~ SOPN: 22.0000 [IU] | PEN_INJECTOR | Freq: Every day | SUBCUTANEOUS | 4 refills | Status: AC

## 2024-02-05 MED ORDER — INSULIN PEN NEEDLE 32G X 4 MM MISC: 3 refills | Status: AC

## 2024-02-05 MED ORDER — FREESTYLE LIBRE 3 PLUS SENSOR MISC: 1.0000 | 4 refills | Status: AC

## 2024-02-05 NOTE — Progress Notes (Signed)
 "  Outpatient Endocrinology Note Kedra Mcglade, MD  02/05/2024  Patient's Name: Samantha Willis    DOB: 1975-02-11    MRN: [id number]                                                    REASON OF VISIT: Follow up  for type 1 diabetes mellitus  PCP: Prezley Qadir, MD  HISTORY OF PRESENT ILLNESS:   Samantha Willis is a 49 y.o. old female with past medical history listed below, is here for follow up of type 1 diabetes mellitus.    Pertinent Diabetes History: She was diagnosed with type 1 diabetes mellitus in 2021.  She was initially presented with diabetes ketoacidosis. Hemoglobin A1c was 14.7% at the time of diagnosis.  She has remained uncontrolled diabetes with hemoglobin A1c in the range of 7.3 to 14.7%, mostly in the range of 8 to 11%.  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  Previous diabetes education: Yes   Chronic Diabetes Complications : Retinopathy: no. Last ophthalmology exam was done on annually, reportedly. Nephropathy: no Peripheral neuropathy: no Coronary artery disease: no Stroke: no  Relevant comorbidities and cardiovascular risk factors: Obesity: no Body mass index is 31.6 kg/m.  Hypertension: yes Hyperlipidemia. yes  Current / Home Diabetic regimen includes: - Lantus  20 units daily. - NovoLog  8-10 units with meals 2 - 3 times a day.  Prior diabetic medications: Trulicity , stopped in 2023.  Had taken metformin  in the past.  Glycemic data:   Glucose data reviewed directly from the glucometer, blood sugar are 170, 225, 259, 273, 88, 295, 110, 258, at different times of the day.  She is no longer using freestyle libre 3+ CGM, high cost/deductible.  CONTINUOUS GLUCOSE MONITORING SYSTEM (CGMS) INTERPRETATION:  Glucose data in the last visit.            FreeStyle Libre  3+ CGM-  Sensor Download (Sensor download was reviewed and summarized below.) Dates: October 13 to November 23, 2023   Impression: She has variable blood sugar.  She has random hypoglycemia in the early morning and sometime in between the meals especially in the evening.  Hypoglycemia mostly in upper 50-60s range.  She also has random hyperglycemia with blood sugar up to 250-300s postprandially usually related to high carb meal and sometimes related to correcting for hypoglycemia.  One of the days she had persistent hyperglycemia with blood sugar in 300s.  Some other times acceptable blood sugar.  Hypoglycemia: Patient has denied hypoglycemic episodes. Patient has hypoglycemia awareness.  Factors modifying glucose control: 1.  Diabetic diet assessment: Mostly 2 - 3 meals a day.  2.  Staying active or exercising: No formal exercise.  3.  Medication compliance: compliant some of the time.  Interval history  Patient reports she is no longer having hypoglycemia.  Hemoglobin A1c worsened to 9.5% today.  Diabetes has been as reviewed and noted above.  She is not clear about full compliance with NovoLog  with meals, reports compliance with taking Lantus  every day.  Denies numbness and tingling of the feet.  No vision problem.  No other complaints today.  REVIEW OF SYSTEMS As per history of present  illness.   PAST MEDICAL HISTORY: Past Medical History:  Diagnosis Date   Anxiety    Carpal tunnel syndrome 05/06/2014    Bilateral   Diabetic ketoacidosis (HCC) 11/17/2019   GERD (gastroesophageal reflux disease)    Graves disease    Graves Disease   Heart palpitations    Obesity     PAST SURGICAL HISTORY: Past Surgical History:  Procedure Laterality Date   Biopsy of Right Breast     At [facility]   CARPAL TUNNEL RELEASE Right 11/21/2015   Procedure: CARPAL TUNNEL RELEASE right;  Surgeon: Arley Curia, MD;  Location: [facility];  Service: Orthopedics;  Laterality:  Right;  FAB   CARPAL TUNNEL RELEASE Left 06/11/2016   Procedure: LEFT CARPAL TUNNEL RELEASE;  Surgeon: Curia Arley, MD;  Location: [facility];  Service: Orthopedics;  Laterality: Left;  REG/FAB   CESAREAN SECTION     IUD REMOVAL     TUBAL LIGATION  2011   WISDOM TOOTH EXTRACTION      ALLERGIES: No Known Allergies  FAMILY HISTORY:  Family History  Problem Relation Age of Onset   Heart attack Mother    Cancer Mother 64       breast   Diabetes Mother        II   Hypertension Father    Hyperlipidemia Father    Gout Father    Healthy Sister    Healthy Brother    Healthy Brother     SOCIAL HISTORY: Social History   Socioeconomic History   Marital status: Married    Spouse name: Not on file   Number of children: 2   Years of education: some col.   Highest education level: Not on file  Occupational History   Occupation: unemployed  Tobacco Use   Smoking status: Former    Current packs/day: 0.00    Average packs/day: 0.3 packs/day    Types: Cigarettes    Quit date: 11/10/2019    Years since quitting: 4.2   Smokeless tobacco: Never  Vaping Use   Vaping status: Never Used  Substance and Sexual Activity   Alcohol use: Not Currently    Comment: Occassionally   Drug use: No   Sexual activity: Yes    Partners: Male    Birth control/protection: Surgical    Comment: btl  Other Topics Concern   Not on file  Social History Narrative   Lives at home w/ her fiance, son, brother and dad   Patient drinks 1 soda per week.   Patient is right handed   Social Drivers of Health   Tobacco Use: Medium Risk (02/05/2024)   Patient History    Smoking Tobacco Use: Former    Smokeless Tobacco Use: Never    Passive Exposure: Not on Actuary Strain: Not on file  Food Insecurity: Not on file  Transportation Needs: Not on file  Physical Activity: Not on file  Stress: Not on file  Social Connections: Not on file  Depression (PHQ2-9): High Risk  (09/11/2023)   Depression (PHQ2-9)    PHQ-2 Score: 15  Alcohol Screen: Not on file  Housing: Not on file  Utilities: Not on file  Health Literacy: Not on file    MEDICATIONS:  Current Outpatient Medications  Medication Sig Dispense Refill   atorvastatin  (LIPITOR) 80 MG tablet Take 1 tablet (80 mg total) by mouth daily. 90 tablet 3   BD PEN NEEDLE NANO 2ND GEN 32G X 4 MM MISC  USE DAILY FOR GLUCOSE CONTROL 100 each 3   blood glucose meter kit and supplies Dispense based on patient and insurance preference. Use up to four times daily as directed. (FOR ICD-10 E10.9, E11.9). 1 each 0   Blood Glucose Monitoring Suppl DEVI 1 each by Does not apply route in the morning, at noon, and at bedtime. May substitute to any manufacturer covered by patient's insurance. 1 each 0   GVOKE HYPOPEN  2-PACK 1 MG/0.2ML SOAJ Inject 1 mg into the skin as needed (severe hypoglycemia with not able to take oral medication or severe alterned mental status / unconcious.). 1 mL 1   Insulin  Syringe-Needle U-100 27G X 1/2 1 ML MISC Use daily for glucose control.  DxE11.9 100 each 3   Continuous Glucose Sensor (FREESTYLE LIBRE 3 PLUS SENSOR) MISC 1 each by Does not apply route continuous. Change every 15 days. 6 each 4   insulin  glargine (LANTUS  SOLOSTAR) 100 UNIT/ML Solostar Pen Inject 22 Units into the skin daily. 15 mL 4   Insulin  Pen Needle 32G X 4 MM MISC Use 4x a day 300 each 3   NOVOLOG  FLEXPEN 100 UNIT/ML FlexPen Take 5-12 units with meals three times a day, maximum 36 units/day. 30 mL 4   No current facility-administered medications for this visit.    PHYSICAL EXAM: Vitals:   02/05/24 1542  BP: 128/80  Pulse: 68  Resp: 16  SpO2: 98%  Weight: 178 lb 6.4 oz (80.9 kg)  Height: 5' 3 (1.6 m)      Body mass index is 31.6 kg/m.  Wt Readings from Last 3 Encounters:  02/05/24 178 lb 6.4 oz (80.9 kg)  11/25/23 176 lb 12.8 oz (80.2 kg)  11/11/23 173 lb (78.5 kg)    General: Well developed, well nourished  female in no apparent distress.  HEENT: AT/[place], no external lesions.  Eyes: Conjunctiva clear and no icterus. Neck: Neck supple  Lungs: Respirations not labored Neurologic: Alert, oriented, normal speech Extremities / Skin: Dry.  Psychiatric: Does not appear depressed or anxious  Diabetic Foot Exam - Simple   No data filed    LABS Reviewed Lab Results  Component Value Date   HGBA1C 9.5 (A) 02/05/2024   HGBA1C 7.1 (A) 11/05/2023   HGBA1C 9.1 (A) 07/24/2023   Lab Results  Component Value Date   FRUCTOSAMINE 459 (H) 02/14/2022   Lab Results  Component Value Date   CHOL 142 11/05/2023   HDL 75 11/05/2023   LDLCALC 56 11/05/2023   LDLDIRECT 64.0 02/14/2022   TRIG 40 11/05/2023   CHOLHDL 1.9 11/05/2023   Lab Results  Component Value Date   MICRALBCREAT 9 11/05/2023    Lab Results  Component Value Date   CREATININE 0.69 11/05/2023   Lab Results  Component Value Date   GFR 84.57 09/03/2022    Latest Reference Range & Units 09/05/22 15:48  Glucose 70 - 99 mg/dL 704 (H)  IA-2 Antibody <5.4 U/mL <5.4  ZNT8 Antibodies <15 U/mL 47 (H)  Glutamic Acid Decarb Ab <5 IU/mL >250 (H)  C-Peptide 0.80 - 3.85 ng/mL 0.41 (L)  (H): Data is abnormally high (L): Data is abnormally low  ASSESSMENT / PLAN  1. Uncontrolled type 1 diabetes mellitus with hyperglycemia, with long-term current use of insulin  (HCC)   2. H/O Graves' disease     Diabetes Mellitus type 1, no other known complications. - Diabetic status / severity: Uncontrolled worsening.  Lab Results  Component Value Date   HGBA1C 9.5 (A) 02/05/2024    -  Hemoglobin A1c goal : <6.5%  She has hyperglycemia likely related to postprandial in 200s.  Worsening diabetes control.  Discussed about limiting carbohydrate and portion control.   Adjusted diabetes regimen as follows. - Medications: Adjusted as follows.  I) increase Lantus  from 20 to 22 units daily.  She had hypoglycemia when taking basal insulin  25 units in  the prior visit. II) increase NovoLog  from 8-10 to 5-12 units based on meal size and blood sugar around that time, 3 times a day with meals.   - Home glucose testing: Encouraged to restart CGM Freestyle libre 3+, if cost effective, check as needed.  - Discussed/ Gave Hypoglycemia treatment plan.  Discussed, in prior visit sent prescription for Glucagon  Emergency Kit.  # Consult : not required at this time.   # Annual urine for microalbuminuria/ creatinine ratio, no microalbuminuria currently. Last  Lab Results  Component Value Date   MICRALBCREAT 9 11/05/2023     # Foot check nightly.  # Annual dilated diabetic eye exams.   - Diet: Make healthy diabetic food choices - Life style / activity / exercise: Discussed.  2. Blood pressure  -  BP Readings from Last 1 Encounters:  02/05/24 128/80    - Control is in target.  - No change in current plans.  3. Lipid status / Hyperlipidemia - Last  Lab Results  Component Value Date   LDLCALC 56 11/05/2023   - Continue atorvastatin  80 mg daily.      # Patient has history of Graves' disease on remission.  Normal thyroid  function test in October 2025.  Will check thyroid  function test every 6 to 12 months.  Diagnoses and all orders for this visit:  Uncontrolled type 1 diabetes mellitus with hyperglycemia, with long-term current use of insulin  (HCC) -     POCT glycosylated hemoglobin (Hb A1C) -     insulin  glargine (LANTUS  SOLOSTAR) 100 UNIT/ML Solostar Pen; Inject 22 Units into the skin daily. -     Insulin  Pen Needle 32G X 4 MM MISC; Use 4x a day -     NOVOLOG  FLEXPEN 100 UNIT/ML FlexPen; Take 5-12 units with meals three times a day, maximum 36 units/day. -     Continuous Glucose Sensor (FREESTYLE LIBRE 3 PLUS SENSOR) MISC; 1 each by Does not apply route continuous. Change every 15 days.  H/O Graves' disease   Patient requested medications prescription sent to [facility].  DISPOSITION Follow up in clinic in 3 months  suggested.  Asked to call our clinic with any questions.  All questions answered and patient verbalized understanding of the plan.  Davine Coba, MD [facility] Endocrinology [facility] [address] [place], [address] [place], [place] [address] Phone # [phone]  At least part of this note was generated using voice recognition software. Inadvertent word errors may have occurred, which were not recognized during the proofreading process. "

## 2024-05-13 ENCOUNTER — Ambulatory Visit: Admitting: Endocrinology
